# Patient Record
Sex: Male | Born: 1940 | Race: White | Hispanic: No | Marital: Married | Smoking: Former smoker
Health system: Southern US, Community
[De-identification: ages and names within clinical notes are randomized; demographics above are authoritative.]

## PROBLEM LIST (undated history)

## (undated) DIAGNOSIS — H579 Unspecified disorder of eye and adnexa: Secondary | ICD-10-CM

## (undated) DIAGNOSIS — H9190 Unspecified hearing loss, unspecified ear: Secondary | ICD-10-CM

## (undated) DIAGNOSIS — M1990 Unspecified osteoarthritis, unspecified site: Secondary | ICD-10-CM

## (undated) DIAGNOSIS — N289 Disorder of kidney and ureter, unspecified: Secondary | ICD-10-CM

## (undated) DIAGNOSIS — I639 Cerebral infarction, unspecified: Secondary | ICD-10-CM

## (undated) DIAGNOSIS — I519 Heart disease, unspecified: Secondary | ICD-10-CM

## (undated) DIAGNOSIS — J189 Pneumonia, unspecified organism: Secondary | ICD-10-CM

## (undated) DIAGNOSIS — Z1211 Encounter for screening for malignant neoplasm of colon: Secondary | ICD-10-CM

## (undated) DIAGNOSIS — K5792 Diverticulitis of intestine, part unspecified, without perforation or abscess without bleeding: Secondary | ICD-10-CM

## (undated) DIAGNOSIS — I714 Abdominal aortic aneurysm, without rupture, unspecified: Secondary | ICD-10-CM

## (undated) DIAGNOSIS — Z87891 Personal history of nicotine dependence: Secondary | ICD-10-CM

## (undated) DIAGNOSIS — E785 Hyperlipidemia, unspecified: Secondary | ICD-10-CM

## (undated) DIAGNOSIS — M109 Gout, unspecified: Secondary | ICD-10-CM

## (undated) DIAGNOSIS — Z860100 Personal history of colon polyps, unspecified: Secondary | ICD-10-CM

## (undated) DIAGNOSIS — K219 Gastro-esophageal reflux disease without esophagitis: Secondary | ICD-10-CM

## (undated) DIAGNOSIS — IMO0002 Reserved for concepts with insufficient information to code with codable children: Secondary | ICD-10-CM

## (undated) DIAGNOSIS — E669 Obesity, unspecified: Secondary | ICD-10-CM

## (undated) DIAGNOSIS — E119 Type 2 diabetes mellitus without complications: Secondary | ICD-10-CM

## (undated) DIAGNOSIS — I10 Essential (primary) hypertension: Secondary | ICD-10-CM

## (undated) DIAGNOSIS — I959 Hypotension, unspecified: Secondary | ICD-10-CM

## (undated) DIAGNOSIS — K639 Disease of intestine, unspecified: Secondary | ICD-10-CM

## (undated) HISTORY — PX: CORONARY ARTERY BYPASS GRAFT: SHX141

## (undated) HISTORY — DX: Gastro-esophageal reflux disease without esophagitis: K21.9

## (undated) HISTORY — DX: Abdominal aortic aneurysm, without rupture, unspecified: I71.40

## (undated) HISTORY — DX: Diverticulitis of intestine, part unspecified, without perforation or abscess without bleeding: K57.92

## (undated) HISTORY — DX: Unspecified osteoarthritis, unspecified site: M19.90

## (undated) HISTORY — DX: Disease of intestine, unspecified: K63.9

## (undated) HISTORY — DX: Abdominal aortic aneurysm, without rupture: I71.4

## (undated) HISTORY — DX: Disorder of kidney and ureter, unspecified: N28.9

## (undated) HISTORY — DX: Obesity, unspecified: E66.9

## (undated) HISTORY — DX: Personal history of nicotine dependence: Z87.891

## (undated) HISTORY — DX: Encounter for screening for malignant neoplasm of colon: Z12.11

## (undated) HISTORY — DX: Unspecified disorder of eye and adnexa: H57.9

## (undated) HISTORY — DX: Gout, unspecified: M10.9

## (undated) HISTORY — DX: Reserved for concepts with insufficient information to code with codable children: IMO0002

## (undated) HISTORY — DX: Essential (primary) hypertension: I10

## (undated) HISTORY — DX: Type 2 diabetes mellitus without complications: E11.9

## (undated) HISTORY — DX: Cerebral infarction, unspecified: I63.9

## (undated) HISTORY — DX: Hyperlipidemia, unspecified: E78.5

## (undated) HISTORY — PX: STOMACH SURGERY: SHX791

## (undated) HISTORY — DX: Unspecified hearing loss, unspecified ear: H91.90

## (undated) HISTORY — DX: Personal history of colonic polyps: Z86.010

## (undated) HISTORY — DX: Heart disease, unspecified: I51.9

## (undated) HISTORY — DX: Personal history of colon polyps, unspecified: Z86.0100

## (undated) SURGERY — Surgical Case
Anesthesia: *Unknown

---

## 1988-05-05 DIAGNOSIS — I519 Heart disease, unspecified: Secondary | ICD-10-CM

## 1988-05-05 HISTORY — DX: Heart disease, unspecified: I51.9

## 1988-09-02 HISTORY — PX: CORONARY ARTERY BYPASS GRAFT: SHX141

## 1997-05-05 DIAGNOSIS — K639 Disease of intestine, unspecified: Secondary | ICD-10-CM

## 1997-05-05 HISTORY — DX: Disease of intestine, unspecified: K63.9

## 2005-01-29 ENCOUNTER — Ambulatory Visit: Payer: Self-pay | Admitting: Ophthalmology

## 2005-03-19 ENCOUNTER — Ambulatory Visit: Payer: Self-pay | Admitting: Ophthalmology

## 2007-05-06 DIAGNOSIS — M109 Gout, unspecified: Secondary | ICD-10-CM

## 2007-05-06 DIAGNOSIS — IMO0002 Reserved for concepts with insufficient information to code with codable children: Secondary | ICD-10-CM

## 2007-05-06 DIAGNOSIS — I10 Essential (primary) hypertension: Secondary | ICD-10-CM

## 2007-05-06 DIAGNOSIS — E119 Type 2 diabetes mellitus without complications: Secondary | ICD-10-CM

## 2007-05-06 DIAGNOSIS — E785 Hyperlipidemia, unspecified: Secondary | ICD-10-CM

## 2007-05-06 DIAGNOSIS — I639 Cerebral infarction, unspecified: Secondary | ICD-10-CM

## 2007-05-06 DIAGNOSIS — K219 Gastro-esophageal reflux disease without esophagitis: Secondary | ICD-10-CM

## 2007-05-06 HISTORY — PX: COLONOSCOPY: SHX174

## 2007-05-06 HISTORY — DX: Reserved for concepts with insufficient information to code with codable children: IMO0002

## 2007-05-06 HISTORY — DX: Essential (primary) hypertension: I10

## 2007-05-06 HISTORY — DX: Gout, unspecified: M10.9

## 2007-05-06 HISTORY — DX: Gastro-esophageal reflux disease without esophagitis: K21.9

## 2007-05-06 HISTORY — DX: Cerebral infarction, unspecified: I63.9

## 2007-05-06 HISTORY — PX: EYE SURGERY: SHX253

## 2007-05-06 HISTORY — DX: Type 2 diabetes mellitus without complications: E11.9

## 2007-05-06 HISTORY — DX: Hyperlipidemia, unspecified: E78.5

## 2008-02-01 ENCOUNTER — Ambulatory Visit: Payer: Self-pay | Admitting: General Surgery

## 2008-02-10 ENCOUNTER — Ambulatory Visit: Payer: Self-pay | Admitting: General Surgery

## 2008-02-25 ENCOUNTER — Ambulatory Visit: Payer: Self-pay | Admitting: Internal Medicine

## 2008-05-05 DIAGNOSIS — H579 Unspecified disorder of eye and adnexa: Secondary | ICD-10-CM

## 2008-05-05 HISTORY — PX: HERNIA REPAIR: SHX51

## 2008-05-05 HISTORY — DX: Unspecified disorder of eye and adnexa: H57.9

## 2008-08-03 ENCOUNTER — Ambulatory Visit: Payer: Self-pay | Admitting: General Surgery

## 2008-08-21 ENCOUNTER — Ambulatory Visit: Payer: Self-pay | Admitting: General Surgery

## 2008-08-24 ENCOUNTER — Ambulatory Visit: Payer: Self-pay | Admitting: General Surgery

## 2009-02-05 ENCOUNTER — Ambulatory Visit: Payer: Self-pay | Admitting: General Surgery

## 2009-02-16 ENCOUNTER — Ambulatory Visit: Payer: Self-pay | Admitting: Internal Medicine

## 2010-02-11 ENCOUNTER — Ambulatory Visit: Payer: Self-pay | Admitting: General Surgery

## 2010-03-04 ENCOUNTER — Inpatient Hospital Stay: Payer: Self-pay | Admitting: Internal Medicine

## 2011-02-06 ENCOUNTER — Other Ambulatory Visit: Payer: Self-pay | Admitting: General Surgery

## 2011-02-11 ENCOUNTER — Ambulatory Visit: Payer: Self-pay | Admitting: General Surgery

## 2011-03-07 ENCOUNTER — Ambulatory Visit: Payer: Self-pay | Admitting: General Surgery

## 2011-03-07 HISTORY — PX: COLONOSCOPY: SHX174

## 2012-02-12 ENCOUNTER — Ambulatory Visit: Payer: Self-pay | Admitting: General Surgery

## 2012-10-05 ENCOUNTER — Encounter: Payer: Self-pay | Admitting: *Deleted

## 2012-10-27 ENCOUNTER — Ambulatory Visit: Payer: Self-pay | Admitting: Internal Medicine

## 2013-02-14 ENCOUNTER — Ambulatory Visit: Payer: Self-pay | Admitting: General Surgery

## 2013-02-14 ENCOUNTER — Encounter: Payer: Self-pay | Admitting: General Surgery

## 2013-02-24 ENCOUNTER — Ambulatory Visit (INDEPENDENT_AMBULATORY_CARE_PROVIDER_SITE_OTHER): Payer: Medicare Other | Admitting: General Surgery

## 2013-02-24 ENCOUNTER — Encounter: Payer: Self-pay | Admitting: General Surgery

## 2013-02-24 VITALS — BP 130/74 | HR 78 | Resp 14 | Ht 69.0 in | Wt 216.0 lb

## 2013-02-24 DIAGNOSIS — Z86010 Personal history of colonic polyps: Secondary | ICD-10-CM

## 2013-02-24 DIAGNOSIS — I714 Abdominal aortic aneurysm, without rupture: Secondary | ICD-10-CM

## 2013-02-24 NOTE — Patient Instructions (Addendum)
The patient is aware to call back for any questions or concerns. One year follow up with ultrasound of abdominal aortic aneurysm.  Colonoscopy due 2017.

## 2013-02-24 NOTE — Progress Notes (Signed)
Patient ID: Robert Conrad, male   DOB: [birth year], 72 y.o.   MRN: [place]:[id number]  Chief Complaint  Patient presents with  . Follow-up    abdominal aortic aneurysm    HPI Robert Conrad is a 72 y.o. male.  Here today for his one year follow up abdominal aortic aneurysm. He had a ultrasound scan done at [facility]. Also, he has had a prior sigmoid resection for polyps. Colonoscopy 03-07-11 was normal. Currently with no gastrointestinal symptoms. Denies any leg pain with walking. He has been noted to have renal insufficiency being followed by nephrology. HPI  Past Medical History  Diagnosis Date  . 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 Heart disease 1990  . Hypertension 2009  . Personal history of tobacco use, presenting hazards to health   . Ulcer 2009    stomach  . Arthritis   . Gout 2009  . GERD (gastroesophageal reflux disease) 2009  . Stroke 2009  . Eye problems 2010  . Special screening for malignant neoplasms, colon   . 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 Obesity, unspecified   . Hyperlipidemia 2009  . Bowel trouble 1999  . Personal history of colonic polyps   . Diverticulitis   . Hearing loss   . Renal insufficiency     Past Surgical History  Procedure Laterality Date  . Colonoscopy  2009  . Hernia repair  2010    ventral and umbilical   . Stomach surgery    . Eye surgery  2009  . Coronary artery bypass graft  May 1990  . Colonoscopy  03-07-11    Dr Jamal Collin    Family History  Problem Relation Age of Onset  . Cancer Mother   . Heart attack Father     Social History History  Substance Use Topics  . Smoking status: Never Smoker   . Smokeless tobacco: Never Used     Comment: quit in 1990  . Alcohol Use: No    Allergies  Allergen Reactions  . Ciprofloxacin Hcl Rash    Current Outpatient Prescriptions  Medication Sig Dispense Refill  . aspirin 325 MG tablet Take 325 mg by mouth daily.      . benazepril  (LOTENSIN) 20 MG tablet Take 20 mg by mouth daily.       Marland Kitchen glimepiride (AMARYL) 4 MG tablet Take 4 mg by mouth daily before breakfast.       . hydroxypropyl methylcellulose (ISOPTO TEARS) 2.5 % ophthalmic solution 1 drop.      . metFORMIN (GLUCOPHAGE-XR) 500 MG 24 hr tablet Take 1,000 mg by mouth daily with breakfast.       . metoprolol succinate (TOPROL-XL) 25 MG 24 hr tablet Take 25 mg by mouth daily.       . naproxen (NAPROSYN) 500 MG tablet Take 500 mg by mouth daily.       Marland Kitchen omeprazole (PRILOSEC) 20 MG capsule Take 20 mg by mouth daily.       . ONE TOUCH ULTRA TEST test strip       . ONETOUCH DELICA LANCETS 99991111 MISC       . simvastatin (ZOCOR) 20 MG tablet Take 20 mg by mouth daily.        No current facility-administered medications for this visit.    Review of Systems Review of Systems  Constitutional: Negative.   Respiratory: Positive for shortness of breath (chronic).   Cardiovascular: Negative.  Blood pressure 130/74, pulse 78, resp. rate 14, height 5\' 9"  (1.753 m), weight 216 lb (97.977 kg).  Physical Exam Physical Exam  Constitutional: He is oriented to person, place, and time. He appears well-developed and well-nourished.  Eyes: Conjunctivae are normal. No scleral icterus.  Neck: Neck supple. Carotid bruit is not present.  Cardiovascular: Normal rate, regular rhythm and normal heart sounds.   Pulses:      Femoral pulses are 2+ on the right side, and 2+ on the left side.      Dorsalis pedis pulses are 2+ on the right side, and 2+ on the left side.       Posterior tibial pulses are 2+ on the right side, and 2+ on the left side.  Scant edema bilateral lower extremity 1+  Pulmonary/Chest: Effort normal and breath sounds normal.  Abdominal: Soft. Normal appearance and bowel sounds are normal. There is no hepatosplenomegaly. There is no tenderness. No hernia.    Lymphadenopathy:    He has no cervical adenopathy.  Neurological: He is alert and oriented to person,  place, and time.  Skin: Skin is warm and dry.    Data Reviewed Abdominal ultrasound reviewed and stable. AAA measures 4.0/4.3cm. Minimal change over last 2 yrs.  Assessment    Stable exam, abdominal aortic aneurysm stable. History of colon polyp.    Plan    One year follow up with ultrasound of abdominal aortic aneurysm. Colonoscopy due 2017.       Donnia Poplaski G 02/24/2013, 11:41 AM

## 2013-03-18 ENCOUNTER — Emergency Department: Payer: Self-pay | Admitting: Emergency Medicine

## 2013-03-18 LAB — URINALYSIS, COMPLETE
Blood: NEGATIVE
Glucose,UR: 50 mg/dL (ref 0–75)
Leukocyte Esterase: NEGATIVE
Nitrite: NEGATIVE
Ph: 6 (ref 4.5–8.0)
Protein: NEGATIVE
Squamous Epithelial: NONE SEEN

## 2013-03-18 LAB — CBC
HGB: 15.4 g/dL (ref 13.0–18.0)
MCH: 28.5 pg (ref 26.0–34.0)
MCHC: 33.1 g/dL (ref 32.0–36.0)
MCV: 86 fL (ref 80–100)
RDW: 14 % (ref 11.5–14.5)

## 2013-03-18 LAB — COMPREHENSIVE METABOLIC PANEL
Albumin: 3.9 g/dL (ref 3.4–5.0)
Anion Gap: 5 — ABNORMAL LOW (ref 7–16)
BUN: 19 mg/dL — ABNORMAL HIGH (ref 7–18)
EGFR (African American): 54 — ABNORMAL LOW
Osmolality: 283 (ref 275–301)
Potassium: 4.3 mmol/L (ref 3.5–5.1)
SGOT(AST): 14 U/L — ABNORMAL LOW (ref 15–37)
SGPT (ALT): 26 U/L (ref 12–78)
Sodium: 139 mmol/L (ref 136–145)
Total Protein: 7 g/dL (ref 6.4–8.2)

## 2013-08-31 ENCOUNTER — Observation Stay: Payer: Self-pay | Admitting: Internal Medicine

## 2013-08-31 LAB — CK TOTAL AND CKMB (NOT AT ARMC)
CK, TOTAL: 57 U/L
CK, Total: 62 U/L
CK, Total: 60 U/L
CK-MB: 1.1 ng/mL (ref 0.5–3.6)
CK-MB: 1.2 ng/mL (ref 0.5–3.6)
CK-MB: 1.4 ng/mL (ref 0.5–3.6)

## 2013-08-31 LAB — TROPONIN I
Troponin-I: <0.02 ng/mL
Troponin-I: <0.02 ng/mL
Troponin-I: <0.02 ng/mL

## 2013-08-31 LAB — CBC
HCT: 46.9 % (ref 40.0–52.0)
HGB: 15.4 g/dL (ref 13.0–18.0)
MCH: 28.6 pg (ref 26.0–34.0)
MCHC: 32.8 g/dL (ref 32.0–36.0)
MCV: 87 fL (ref 80–100)
PLATELETS: 182 10*3/uL (ref 150–440)
RBC: 5.39 10*6/uL (ref 4.40–5.90)
RDW: 14.5 % (ref 11.5–14.5)
WBC: 8.3 10*3/uL (ref 3.8–10.6)

## 2013-08-31 LAB — COMPREHENSIVE METABOLIC PANEL
ALK PHOS: 46 U/L
Albumin: 3.8 g/dL (ref 3.4–5.0)
Anion Gap: 6 — ABNORMAL LOW (ref 7–16)
BILIRUBIN TOTAL: 0.5 mg/dL (ref 0.2–1.0)
BUN: 24 mg/dL — ABNORMAL HIGH (ref 7–18)
CO2: 29 mmol/L (ref 21–32)
Calcium, Total: 9.1 mg/dL (ref 8.5–10.1)
Chloride: 101 mmol/L (ref 98–107)
Creatinine: 1.42 mg/dL — ABNORMAL HIGH (ref 0.60–1.30)
EGFR (African American): 56 — ABNORMAL LOW
GFR CALC NON AF AMER: 48 — AB
GLUCOSE: 237 mg/dL — AB (ref 65–99)
Osmolality: 284 (ref 275–301)
Potassium: 4.9 mmol/L (ref 3.5–5.1)
SGOT(AST): 15 U/L (ref 15–37)
SGPT (ALT): 25 U/L (ref 12–78)
Sodium: 136 mmol/L (ref 136–145)
Total Protein: 7.2 g/dL (ref 6.4–8.2)

## 2013-09-01 LAB — BASIC METABOLIC PANEL
Anion Gap: 6 — ABNORMAL LOW (ref 7–16)
BUN: 25 mg/dL — ABNORMAL HIGH (ref 7–18)
Calcium, Total: 9.1 mg/dL (ref 8.5–10.1)
Chloride: 103 mmol/L (ref 98–107)
Co2: 28 mmol/L (ref 21–32)
Creatinine: 1.32 mg/dL — ABNORMAL HIGH (ref 0.60–1.30)
EGFR (African American): >60
EGFR (Non-African Amer.): 53 — ABNORMAL LOW
Glucose: 148 mg/dL — ABNORMAL HIGH (ref 65–99)
OSMOLALITY: 281 (ref 275–301)
Potassium: 4.5 mmol/L (ref 3.5–5.1)
Sodium: 137 mmol/L (ref 136–145)

## 2013-09-02 LAB — CK TOTAL AND CKMB (NOT AT ARMC)
CK, Total: 46 U/L
CK-MB: 1.1 ng/mL (ref 0.5–3.6)

## 2013-09-02 LAB — BASIC METABOLIC PANEL
ANION GAP: 7 (ref 7–16)
BUN: 19 mg/dL — ABNORMAL HIGH (ref 7–18)
Calcium, Total: 9.1 mg/dL (ref 8.5–10.1)
Chloride: 104 mmol/L (ref 98–107)
Co2: 26 mmol/L (ref 21–32)
Creatinine: 1.19 mg/dL (ref 0.60–1.30)
GFR CALC NON AF AMER: 60 — AB
GLUCOSE: 150 mg/dL — AB (ref 65–99)
OSMOLALITY: 279 (ref 275–301)
POTASSIUM: 4.7 mmol/L (ref 3.5–5.1)
SODIUM: 137 mmol/L (ref 136–145)

## 2013-09-27 DIAGNOSIS — I25810 Atherosclerosis of coronary artery bypass graft(s) without angina pectoris: Secondary | ICD-10-CM | POA: Insufficient documentation

## 2013-09-29 ENCOUNTER — Telehealth: Payer: Self-pay | Admitting: General Surgery

## 2013-09-29 NOTE — Telephone Encounter (Signed)
PAM CALLED FROM DR [facility] STATING Robert Conrad NEEDS AN APPT TO DR Candise Bowens TO EVAL TRIPLE AAA WITH RT RENAL ARTERIAL STENOSIS FOUND ON CATH DONE 2015/MTH    ([facility] # [phone])

## 2013-09-30 HISTORY — PX: CARDIAC CATHETERIZATION: SHX172

## 2013-10-05 ENCOUNTER — Encounter: Payer: Self-pay | Admitting: General Surgery

## 2013-10-05 ENCOUNTER — Ambulatory Visit: Payer: Medicare HMO | Admitting: General Surgery

## 2013-10-05 ENCOUNTER — Ambulatory Visit (INDEPENDENT_AMBULATORY_CARE_PROVIDER_SITE_OTHER): Payer: Medicare HMO | Admitting: General Surgery

## 2013-10-05 VITALS — BP 140/82 | HR 80 | Resp 12 | Ht 69.0 in | Wt 212.0 lb

## 2013-10-05 DIAGNOSIS — I714 Abdominal aortic aneurysm, without rupture, unspecified: Secondary | ICD-10-CM

## 2013-10-05 DIAGNOSIS — I701 Atherosclerosis of renal artery: Secondary | ICD-10-CM | POA: Insufficient documentation

## 2013-10-05 NOTE — Progress Notes (Signed)
Patient ID: Robert Conrad, male   DOB: 12/12/1940, 73 y.o.   MRN: [place]:[id number]  Chief Complaint  Patient presents with  . Other    AAA and right renal artery stenosis    HPI Robert Conrad is a 73 y.o. male.  Here today for evaluation of a abdominal aortic aneurysm and right renal artery stenosis. Referred by Dr. Ubaldo Glassing, cath done 09-01-13. It is reported he has a 95% stenosis of right renal artery.m No mention of AAAsize. Last US showed a stable 4.2 cm AAA-this ws 60mos ago HPI  Past Medical History  Diagnosis Date  . 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 Heart disease 1990  . Hypertension 2009  . Personal history of tobacco use, presenting hazards to health   . Ulcer 2009    stomach  . Arthritis   . Gout 2009  . GERD (gastroesophageal reflux disease) 2009  . Stroke 2009  . Eye problems 2010  . Special screening for malignant neoplasms, colon   . 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 Obesity, unspecified   . Hyperlipidemia 2009  . Bowel trouble 1999  . Personal history of colonic polyps   . Diverticulitis   . Hearing loss   . Renal insufficiency     Past Surgical History  Procedure Laterality Date  . Colonoscopy  2009  . Hernia repair  2010    ventral and umbilical   . Stomach surgery      sigmoid colon resection  . Eye surgery  2009  . Coronary artery bypass graft  May 1990  . Colonoscopy  03-07-11    Dr Jamal Collin  . Cardiac catheterization  09-30-13    Family History  Problem Relation Age of Onset  . Cancer Mother   . Heart attack Father     Social History History  Substance Use Topics  . Smoking status: Never Smoker   . Smokeless tobacco: Never Used     Comment: quit in 1990  . Alcohol Use: No    Allergies  Allergen Reactions  . Ciprofloxacin Hcl Rash    Current Outpatient Prescriptions  Medication Sig Dispense Refill  . aspirin 325 MG tablet Take 325 mg by mouth daily.      . benazepril (LOTENSIN) 20  MG tablet Take 20 mg by mouth daily.       Marland Kitchen BRILINTA 90 MG TABS tablet Take 1 tablet by mouth daily.      . chlorthalidone (HYGROTON) 25 MG tablet Take 1 tablet by mouth daily.      Marland Kitchen glimepiride (AMARYL) 4 MG tablet Take 4 mg by mouth daily before breakfast.       . hydroxypropyl methylcellulose (ISOPTO TEARS) 2.5 % ophthalmic solution 1 drop.      . metFORMIN (GLUCOPHAGE-XR) 500 MG 24 hr tablet Take 1,000 mg by mouth daily with breakfast.       . metoprolol succinate (TOPROL-XL) 25 MG 24 hr tablet Take 25 mg by mouth daily.       . naproxen (NAPROSYN) 500 MG tablet Take 500 mg by mouth daily.       Marland Kitchen omeprazole (PRILOSEC) 20 MG capsule Take 20 mg by mouth daily.       . ONE TOUCH ULTRA TEST test strip       . ONETOUCH DELICA LANCETS 99991111 MISC       . simvastatin (ZOCOR) 20 MG tablet Take 20 mg by mouth daily.  No current facility-administered medications for this visit.    Review of Systems Review of Systems  Constitutional: Negative.   Respiratory: Positive for chest tightness and shortness of breath. Negative for apnea, cough, choking, wheezing and stridor.   Cardiovascular: Negative.     Blood pressure 140/82, pulse 80, resp. rate 12, height 5\' 9"  (1.753 m), weight 212 lb (96.163 kg).  Physical Exam Physical Exam  Constitutional: He is oriented to person, place, and time. He appears well-developed and well-nourished.  Eyes: Conjunctivae are normal. No scleral icterus.  Neck: Neck supple.  Cardiovascular: Normal rate, regular rhythm, normal heart sounds, intact distal pulses and normal pulses.   Pulses:      Carotid pulses are 2+ on the right side, and 2+ on the left side.      Dorsalis pedis pulses are 2+ on the right side, and 2+ on the left side.       Posterior tibial pulses are 2+ on the right side, and 2+ on the left side.  No edema   Pulmonary/Chest: Effort normal and breath sounds normal.  Abdominal: Soft. Normal appearance and bowel sounds are normal. There is  no hepatomegaly. There is no tenderness.  Neurological: He is alert and oriented to person, place, and time.  Skin: Skin is warm and dry.    Data Reviewed Notes reviewed from cath report.   Assessment    AAA, right renal artery stenosis. Discussed possibility of renal artery stent.He is agreeable to a consult    Plan    Appt with Dr. Delana Meyer from Vascular surgery     Patient is scheduled to see Dr Delana Meyer on 10/20/13 at 2:15 pm. Patient is aware of date and time.  Seeplaputhur G Sankar 10/05/2013, 1:03 PM

## 2013-10-05 NOTE — Patient Instructions (Addendum)
Follow up appointment to be announced.    Patient is scheduled to see Dr Delana Meyer on 10/20/13 at 2:15 pm. Patient is aware of date and time.

## 2013-10-19 ENCOUNTER — Ambulatory Visit: Payer: Medicare HMO | Admitting: General Surgery

## 2013-11-22 ENCOUNTER — Ambulatory Visit: Payer: Self-pay | Admitting: Vascular Surgery

## 2013-11-22 LAB — BASIC METABOLIC PANEL
Anion Gap: 7 (ref 7–16)
BUN: 22 mg/dL — ABNORMAL HIGH (ref 7–18)
CO2: 28 mmol/L (ref 21–32)
Calcium, Total: 8.7 mg/dL (ref 8.5–10.1)
Chloride: 104 mmol/L (ref 98–107)
Creatinine: 1.62 mg/dL — ABNORMAL HIGH (ref 0.60–1.30)
EGFR (African American): 48 — ABNORMAL LOW
GFR CALC NON AF AMER: 41 — AB
GLUCOSE: 131 mg/dL — AB (ref 65–99)
Osmolality: 283 (ref 275–301)
Potassium: 4.7 mmol/L (ref 3.5–5.1)
SODIUM: 139 mmol/L (ref 136–145)

## 2013-12-20 ENCOUNTER — Ambulatory Visit: Payer: Self-pay | Admitting: Cardiology

## 2014-02-28 ENCOUNTER — Ambulatory Visit: Payer: Medicare HMO | Admitting: General Surgery

## 2014-03-02 ENCOUNTER — Encounter: Payer: Self-pay | Admitting: *Deleted

## 2014-08-26 NOTE — Discharge Summary (Signed)
PATIENT NAME:  Robert Conrad, CONTINO MR#:  [id number] DATE OF BIRTH:  1941/01/26  DATE OF ADMISSION:  08/31/2013 DATE OF DISCHARGE:  09/02/2013  ADMITTING DIAGNOSIS: Chest pain.   DISCHARGE DIAGNOSES: 1.  Chest pain due to unstable angina due to occlusion of the proximal circumflex, status post stent.  2.  Coronary artery disease, status post coronary artery bypass grafting in 1990.  3.  History of cerebrovascular accident.  4.  Gastroesophageal reflux disease.  5.  Gout.  6.  Osteoarthritis.  7.  Hyperlipidemia.  8.  Diabetes.  9.  Hypertension.  10.  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  11.  Renal artery stenosis.  12.  Abdominal aortic aneurysm. 13.  Cardiac catheterization showed same occluded saphenous vein graft to right coronary artery, circumflex 90%, diffuse right coronary artery disease.   CONSULTANTS: Dr. Clayborn Bigness.   HOSPITAL COURSE: Please refer to H and P done by the admitting physician. The patient is a 74 year old white male with history of coronary artery disease, CABG, presented with chest pain who was evaluated in the ED and had a negative EKG and cardiac enzymes. Due to these symptoms, he was seen by his cardiologist and plan was to perform a cath. He underwent a cardiac cath and underwent a stenting to the circumflex. The patient is currently asymptomatic and is doing well and is stable for discharge.   DISCHARGE MEDICATIONS: Benazepril 20 daily, omeprazole 20 daily, simvastatin 20 daily, metformin 500 mg 1 tab p.o. b.i.d., glimepiride 4 mg daily, naproxen 220 mg 2 tabs at bedtime,  Eye Health Formula 1 cap daily, chlorthalidone 25 daily, metoprolol succinate 25 mg 1 tab p.o. b.i.d., aspirin 81 mg 1 tab p.o. daily, Brilinta 90 mg 1 tab p.o. b.i.d.   DIET: Low-sodium, low-fat, low-cholesterol.   ACTIVITY: As tolerated.   FOLLOWUP: With primary MD in 1 to 2 weeks. Follow up with Dr. Clayborn Bigness in 2 to 4 weeks.   TIME SPENT: 35 minutes.    ____________________________ Lafonda Mosses Posey Pronto, MD shp:jcm D: 09/02/2013 14:07:33 ET T: 09/02/2013 15:57:25 ET JOB#: [id number]  cc: Alaya Iverson H. Posey Pronto, MD, <Dictator> Alric Seton MD ELECTRONICALLY SIGNED 09/07/2013 16:15

## 2014-08-26 NOTE — Consult Note (Signed)
Chief Complaint:  Subjective/Chief Complaint Pt state sto be doing well. He is s/p PCI stent to circ. He states to have polyuria from fluids. No cp.   VITAL SIGNS/ANCILLARY NOTES: **Vital Signs.:   30-Apr-15 09:40  Temperature Temperature (F) 98.2  Temperature Source oral  Pulse Pulse 79  Respirations Respirations 18  Systolic BP Systolic BP 151  Diastolic BP (mmHg) Diastolic BP (mmHg) 76  Pulse Ox % Pulse Ox % 96    76:16  Systolic BP Systolic BP 073  *Intake and Output.:   Daily 30-Apr-15 07:00  Grand Totals Intake:  240 Output:  500    Net:  -260 24 Hr.:  -260  Oral Intake      In:  240  Urine ml     Out:  500  Length of Stay Totals Intake:  240 Output:  500    Net:  -260   Brief Assessment:  GEN well developed, well nourished, no acute distress   Cardiac Regular  murmur present   Respiratory normal resp effort   Gastrointestinal Normal   Gastrointestinal details normal Soft  Nontender  Nondistended  Bowel sounds normal   Lab Results: Hepatic:  29-Apr-15 07:43   Bilirubin, Total 0.5  Alkaline Phosphatase 46 (45-117 NOTE: New Reference Range 03/25/13)  SGPT (ALT) 25  SGOT (AST) 15  Total Protein, Serum 7.2  Albumin, Serum 3.8  Cardiology:  30-Apr-15 12:34   Ventricular Rate 70  Atrial Rate 70  P-R Interval 248  QRS Duration 148  QT 416  QTc 449  P Axis 56  R Axis 121  T Axis 63  ECG interpretation Sinus rhythm with 1st degree A-V block Right bundle branch block Abnormal ECG When compared with ECG of 31-Aug-2013 07:21, Minimal criteria for Inferior infarct are no longer Present ----------unconfirmed---------- Confirmed by OVERREAD, NOT ([id number]), editor PEARSON, BARBARA ([id number]) on 09/01/2013 1:50:53 PM  Routine Chem:  29-Apr-15 07:43   Glucose, Serum  237  BUN  24  Creatinine (comp)  1.42  Sodium, Serum 136  Potassium, Serum 4.9  Chloride, Serum 101  CO2, Serum 29  Calcium (Total), Serum 9.1  Anion Gap  6  Osmolality (calc) 284  eGFR (African  American)  56  eGFR (Non-African American)  48 (eGFR values <78m/min/1.73 m2 may be an indication of chronic kidney disease (CKD). Calculated eGFR is useful in patients with stable renal function. The eGFR calculation will not be reliable in acutely ill patients when serum creatinine is changing rapidly. It is not useful in  patients on dialysis. The eGFR calculation may not be applicable to patients at the low and high extremes of body sizes, pregnant women, and vegetarians.)  30-Apr-15 04:08   Glucose, Serum  148  BUN  25  Creatinine (comp)  1.32  Sodium, Serum 137  Potassium, Serum 4.5  Chloride, Serum 103  CO2, Serum 28  Calcium (Total), Serum 9.1  Anion Gap  6  Osmolality (calc) 281  eGFR (African American) >60  eGFR (Non-African American)  53 (eGFR values <683mmin/1.73 m2 may be an indication of chronic kidney disease (CKD). Calculated eGFR is useful in patients with stable renal function. The eGFR calculation will not be reliable in acutely ill patients when serum creatinine is changing rapidly. It is not useful in  patients on dialysis. The eGFR calculation may not be applicable to patients at the low and high extremes of body sizes, pregnant women, and vegetarians.)  Result Comment POTASSIUM - Slight hemolysis, interpret results with  -  caution.Marland Kitchentpl  Result(s) reported on 01 Sep 2013 at 05:18AM.  01-May-15 04:10   Glucose, Serum  150  BUN  19  Creatinine (comp) 1.19  Sodium, Serum 137  Potassium, Serum 4.7  Chloride, Serum 104  CO2, Serum 26  Calcium (Total), Serum 9.1  Anion Gap 7  Osmolality (calc) 279  eGFR (African American) >60  eGFR (Non-African American)  60 (eGFR values <27m/min/1.73 m2 may be an indication of chronic kidney disease (CKD). Calculated eGFR is useful in patients with stable renal function. The eGFR calculation will not be reliable in acutely ill patients when serum creatinine is changing rapidly. It is not useful in  patients on  dialysis. The eGFR calculation may not be applicable to patients at the low and high extremes of body sizes, pregnant women, and vegetarians.)  Cardiac:  29-Apr-15 07:43   CK, Total 62 (39-308 NOTE: NEW REFERENCE RANGE  06/06/2013)  CPK-MB, Serum 1.1 (Result(s) reported on 31 Aug 2013 at 08:23AM.)  Troponin I < 0.02 (0.00-0.05 0.05 ng/mL or less: NEGATIVE  Repeat testing in 3-6 hrs  if clinically indicated. >0.05 ng/mL: POTENTIAL  MYOCARDIAL INJURY. Repeat  testing in 3-6 hrs if  clinically indicated. NOTE: An increase or decrease  of 30% or more on serial  testing suggests a  clinically important change)    14:36   CK, Total 57 (39-308 NOTE: NEW REFERENCE RANGE  06/06/2013)  CPK-MB, Serum 1.2 (Result(s) reported on 31 Aug 2013 at 03:07PM.)  Troponin I < 0.02 (0.00-0.05 0.05 ng/mL or less: NEGATIVE  Repeat testing in 3-6 hrs  if clinically indicated. >0.05 ng/mL: POTENTIAL  MYOCARDIAL INJURY. Repeat  testing in 3-6 hrs if  clinically indicated. NOTE: An increase or decrease  of 30% or more on serial  testing suggests a  clinically important change)    17:59   CK, Total 60 (39-308 NOTE: NEW REFERENCE RANGE  06/06/2013)  CPK-MB, Serum 1.4 (Result(s) reported on 31 Aug 2013 at 07:20PM.)  Troponin I < 0.02 (0.00-0.05 0.05 ng/mL or less: NEGATIVE  Repeat testing in 3-6 hrs  if clinically indicated. >0.05 ng/mL: POTENTIAL  MYOCARDIAL INJURY. Repeat  testing in 3-6 hrs if  clinically indicated. NOTE: An increase or decrease  of 30% or more on serial  testing suggests a  clinically important change)  01-May-15 04:10   CK, Total 46 (39-308 NOTE: NEW REFERENCE RANGE  06/06/2013)  CPK-MB, Serum 1.1 (Result(s) reported on 02 Sep 2013 at 06:39AM.)  Routine Hem:  29-Apr-15 07:43   WBC (CBC) 8.3  RBC (CBC) 5.39  Hemoglobin (CBC) 15.4  Hematocrit (CBC) 46.9  Platelet Count (CBC) 182 (Result(s) reported on 31 Aug 2013 at 08:01AM.)  MCV 87  MCH 28.6  MCHC 32.8  RDW  14.5   Radiology Results: XRay:    29-Apr-15 08:48, Chest PA and Lateral  Chest PA and Lateral   REASON FOR EXAM:    chest pain  COMMENTS:       PROCEDURE: DXR - DXR CHEST PA (OR AP) AND LATERAL  - Aug 31 2013  8:48AM     CLINICAL DATA:  Intermittent chest pain for 3 weeks.    EXAM:  CHEST  2 VIEW    COMPARISON:  10/27/2012    FINDINGS:  Sequelae of prior CABG are again identified. Cardiac silhouette is  within normal limits in size for degree of inspiration. The patient  has taken a shallower inspiration than on the prior study. Minimal  linear opacities are present in the lung  bases. No confluent  airspace consolidation, edema, pleural effusion, or pneumothorax is  identified. No acute osseous abnormality is identified.     IMPRESSION:  Minimal bibasilar atelectasis.      Electronically Signed    By: Logan Bores    On: 08/31/2013 08:57         Verified By: Ferol Luz, M.D.,  Cardiology:    29-Apr-15 07:21, ED ECG  Ventricular Rate 76  Atrial Rate 76  P-R Interval 254  QRS Duration 148  QT 404  QTc 454  P Axis 45  R Axis 149  T Axis 50  ECG interpretation   Sinus rhythm with 1st degree A-V block  Right bundle branch block  Left posterior fascicular block  *** Bifascicular block ***  Cannot rule out Inferior infarct (cited on or before 31-Aug-2013)  Abnormal ECG  When compared with ECG of 31-Aug-2013 07:21,  No significant change was found  ----------unconfirmed----------  Confirmed by OVERREAD, NOT ([id number]), editor PEARSON, BARBARA ([id number]) on 09/01/2013 1:54:25 PM  ED ECG     30-Apr-15 12:34, ECG  Ventricular Rate 70  Atrial Rate 70  P-R Interval 248  QRS Duration 148  QT 416  QTc 449  P Axis 56  R Axis 121  T Axis 63  ECG interpretation   Sinus rhythm with 1st degree A-V block  Right bundle branch block  Abnormal ECG  When compared with ECG of 31-Aug-2013 07:21,  Minimal criteria for Inferior infarct are no longer  Present  ----------unconfirmed----------  Confirmed by OVERREAD, NOT ([id number]), editor PEARSON, BARBARA ([id number]) on 09/01/2013 1:50:53 PM  ECG    Assessment/Plan:  Assessment/Plan:  Assessment IMP CAD Angina HTN Hyperlipidemia Hx CABG Abn Ekg S/P PCI stent DES circ CRI  .   Plan PLAN asa 325 mg daily Switch to plavix 75 mg daily x 1 yr Add imdur 30 mg daily Continue b-blocker Statin therapy Increase activity F/U with nephology F/U with Vascular for RAS Hopefully d/c home in am   Electronic Signatures: Lujean Amel D (MD)  (Signed 01-May-15 10:44)  Authored: Chief Complaint, VITAL SIGNS/ANCILLARY NOTES, Brief Assessment, Lab Results, Radiology Results, Assessment/Plan   Last Updated: 01-May-15 10:44 by Yolonda Kida (MD)

## 2014-08-26 NOTE — Consult Note (Signed)
PATIENT NAME:  Robert Conrad, Robert Conrad MR#:  [id number] DATE OF BIRTH:  1940-08-15  DATE OF CONSULTATION:  08/31/2013  REFERRING PHYSICIAN:   CONSULTING PHYSICIAN:  Dwayne D. Clayborn Bigness, MD  PRIMARY CARE PHYSICIAN: Dr. Hall Busing  CARDIOLOGIST: Dr. Ubaldo Glassing  NEPHROLOGIST: Dr. Dub Amis  INDICATION FOR ADMISSION: Unstable angina and chest pain.  HISTORY OF PRESENT ILLNESS: The patient is a 74 year old white male with history of coronary artery disease, myocardial infarction, coronary artery bypass over 15 years ago at [facility], diabetes, hypertension, and renal insufficiency who presented to the Emergency Room with worsening chest pain and anginal symptoms. The patient noticed that about 6 months prior he started having flashes of chest pain, angina, which had gotten worse with activity, sometimes at rest. He had been evaluated by Dr. Ubaldo Glassing and a few months ago was advised to have a cath. At the time, the patient refused. A day before admission, the patient was walking to his truck, walking up a hill and had significant chest discomfort, angina, and shortness of breath, and by the day of admission he told his family he needed to go see the doctor and was brought in for further evaluation. EKG had nonspecific findings and troponin was negative, but said he had had accelerating symptomatology for unstable angina.  PAST MEDICAL HISTORY: Coronary artery disease, cerebrovascular accident, GERD, gout, arthritis, hypertension, diabetes, hypertension, peripheral vascular disease, abdominal aortic aneurysm.   PAST SURGICAL HISTORY: Coronary artery bypass surgery around 1980.  ALLERGIES: CIPRO.  FAMILY HISTORY: Hypertension, coronary artery disease, hyperlipidemia, diabetes.   SOCIAL HISTORY: Smoked, quit several years ago. Lives with his wife. Denies alcohol consumption.   MEDICATIONS: Aspirin 325 a day, benazepril 20 a day, glimepiride 40 once a day, loratadine 10 a day, meclizine 25 three times a day, metformin 500 twice a  day, naproxen once a day, simvastatin 20 a day, Tylenol p.r.n. for arthritis.   REVIEW OF SYSTEMS: No blackout spells or syncope. He has had no nausea, no vomiting. No fever, no chills. No weight loss, no weight gain. No hemoptysis, hematemesis, bright red blood per rectum. He has had some dizziness, chest pain. He has had some dyspnea on exertion. Otherwise negative.   PHYSICAL EXAMINATION: VITAL SIGNS: Blood pressure 160/80, pulse 70, respiratory rate 14, afebrile.  HEENT: Normocephalic, atraumatic. Pupils equal and reactive to light.  NECK: Supple. No significant JVD, bruits or adenopathy.  LUNGS: Clear to auscultation and percussion. No significant wheeze, rhonchi, or rales.  HEART: Regular rate and rhythm. Systolic ejection murmur at the left sternal border. Positive S4. PMI nondisplaced.  ABDOMEN: Benign.  EXTREMITIES: Within normal limits.  NEUROLOGIC: Intact.  SKIN: Normal.   DIAGNOSTIC DATA: Glucose 237, BUN 24, creatinine 1.4. GFR 48. LFTs normal. Troponin less than 0.02. White count 8, hemoglobin 15.4, platelet count 182,000.  EKG: Sinus rhythm, right bundle branch block, left anterior fascicular block. Nonspecific finding. No significant change from previously.   Chest x-ray unremarkable.   ASSESSMENT: 1.  Unstable angina.  2.  Coronary artery disease. 3.  Hypertension. 4.  Diabetes. 5.  Hyperlipidemia. 6.  Renal insufficiency, chronic. 7.  Vertigo. 8.  Peripheral vascular disease. 9.  History of cerebrovascular accident.   PLAN:  1.  Agree with admit. Rule out for myocardial infarction. Follow up cardiac enzymes. Follow-up EKG. Continue aspirin and place on anticoagulation short-term. Continue to evaluate for anginal symptoms. Will probably proceed with a catheterization as suggested by Dr. Ubaldo Glassing for evaluation of his coronary artery bypass arteries as well as native arteries.  Because it has been over 25 years since his bypass, I am concerned about the health of his  bypass graft as well as  progression of his native coronary arteries. Would recommend cardiac catheterization in the morning. 2.  For coronary artery disease, continue aspirin, statin, and beta blockade therapy. Consider low-dose ACE inhibitor if renal function can tolerate it.  3.  For diabetes, continue diabetes management with glimepiride. Follow up fasting sugar and Hemoglobin A1c. 4.  For hypertension, continue blood pressure control with beta blocker, benazepril. He may need additional blood pressure support. His blood pressure is at best borderline controlled.  5.  For vertigo, continue meclizine p.r.n.  6.  For hyperlipidemia, continue simvastatin therapy.   Again, we will proceed with cardiac catheterization initially for further evaluation. We will discuss the case with Dr. Ubaldo Glassing and send records to Dr. Hall Busing.   ____________________________ Loran Senters. Clayborn Bigness, MD ddc:sb D: 09/01/2013 08:17:03 ET T: 09/01/2013 08:51:33 ET JOB#: [id number]  cc: Dwayne D. Clayborn Bigness, MD, <Dictator> Yolonda Kida MD ELECTRONICALLY SIGNED 09/28/2013 16:30

## 2014-08-26 NOTE — Op Note (Signed)
PATIENT NAME:  Robert Conrad MR#:  [id number] DATE OF BIRTH:  [birth year]  DATE OF PROCEDURE:  11/22/2013  PREOPERATIVE DIAGNOSES:  1.  Poorly controlled hypertension.  2.  Renal insufficiency.  3.  Abdominal aortic aneurysm.  4.  Atherosclerotic occlusive disease, bilateral lower extremities.   POSTOPERATIVE DIAGNOSES: 1.  Poorly controlled hypertension.  2.  Renal insufficiency.  3.  Abdominal aortic aneurysm.  4.  Atherosclerotic occlusive disease, bilateral lower extremities.   PROCEDURES PERFORMED:  1.  Abdominal aortogram.  2.  Selective injection of the right renal artery.  3.  Percutaneous transluminal angioplasty with stent placement, right renal artery using a combination of a 6 x 18 Herculink and a 7 x 15 Herculink.   SURGEON: Hortencia Pilar, M.D.   SEDATION: Versed 3 mg plus fentanyl 50 mcg administered IV. Continuous ECG, pulse oximetry, and cardiopulmonary monitoring were performed throughout the entire procedure by the interventional radiology nurse. Total sedation time was 50 minutes.   ACCESS: A 6 French sheath, left common femoral artery.   FLUOROSCOPY TIME: 6.5 minutes.   CONTRAST USED: Isovue 45 mL.   INDICATIONS: [name] is a 74 year old gentleman who was found to have a high-grade renal artery stenosis on cardiac catheterization several months ago. Since that time, his blood pressure has been monitored and he has been under poor control despite multiple medications. He was also noted to have mild renal insufficiency with a creatinine of 1.5. He, therefore, was counseled to undergo angioplasty and stent placement, particularly in light of his abdominal aortic aneurysm, which will require likely require repair within the next year.  The risks and benefits were reviewed. All questions have been answered. The patient has agreed to proceed.   DESCRIPTION OF PROCEDURE: The patient is taken to special procedures and placed in the supine position. After adequate  sedation is achieved, both groins are prepped and draped in a sterile fashion. Ultrasound is placed in a sterile sleeve. Ultrasound is utilized to secondary to lack of appropriate landmarks and to avoid vascular injury. Under real-time visualization, common femoral artery is identified. It is echolucent and pulsatile indicating patency. Significant posterior plaque is identified on ultrasound.   Puncture site is selected and 1% lidocaine is infiltrated, and under real-time visualization with the ultrasound, a microneedle is inserted, Microwire followed by micro sheath, J-wire followed by a 5 French sheath and 5 French pigtail catheter.   Pigtail catheter is positioned at the level of T12 and AP projection of the aorta is obtained. Renal arteries identified and a high-grade stenosis of the right middle artery is confirmed. Four thousand units of heparin is given. A stiff angled Glidewire is introduced through the pigtail catheter and the pigtail catheter and 5 French sheath are removed, and a 6 [place] Ansell sheath is advanced, followed by placement of a VS1 catheter over the wire.  Glidewire and catheter are then used to engage the renal. The wire is advanced, followed by advancing the catheter and then the sheath so that the sheath is protruding into the right renal artery by several millimeters.  Hand injection contrast then used to demonstrate the renal artery through the catheter. This represents first-order catheter placement.   Measurements are then made. Renal artery just past the lesion measures 6.1 mm, and a 6 x 18  Herculink stent is opened and prepped.  It is advanced across the lesion and positioned so that it should be protruding slightly into the aorta. Upon inflation, the stent does shift more forward  by about 3 or 4 mm. It does cover the lesion and treats the lesion adequately, but allows the ostia to be unprotected and, therefore, a 7 x 15 second Herculink stent is advanced over the wire  overlapping the first one and then deployed so that it protrudes approximately 2 to 3 mm into the aorta as is desired. The entire stented segment is then post dilated with a 7 balloon. Final images obtained and the sheath is then pulled back into the external iliac on the right. J-wire is advanced. Oblique view is obtained and a StarClose device deployed without difficulty. There are no immediate complications.   INTERPRETATION: The abdominal aorta is opacified with a bolus injection of contrast. Aneurysmal dilatation  is noted with a heavily calcified rim identified on fluoroscopy as well. There is moderate disease within the iliac system bilaterally. In the distal external iliac on the left, there is a 60% narrowing.   The right renal artery demonstrates a long segment of plaque which has an eccentric prominence in its mid distal portion representing approximately a 60%-70% narrowing. Measurements, as noted above, just past this maximal narrowing are 6.1 mm.  Therefore, a 6 x 18 stent is selected. This is deployed and covers the lesion, but does not fully extend to the ostia and, therefore, a second stent is placed to cover the ostia as well. Follow-up imaging demonstrates 2 very well positioned stents with preservation of distal runoff and excellent flow to the right renal artery.   SUMMARY: Successful angioplasty and stent placement, right renal artery.   ____________________________ Katha Cabal, MD ggs:ts D: 11/22/2013 13:40:30 ET T: 11/22/2013 13:51:51 ET JOB#: [id number]  cc: Katha Cabal, MD, <Dictator> Leona Carry. Hall Busing, MD Mckinley Jewel, MD Javier Docker. Ubaldo Glassing, MD Katha Cabal MD ELECTRONICALLY SIGNED 11/29/2013 12:23

## 2014-08-26 NOTE — H&P (Signed)
PATIENT NAME:  Robert Conrad, MCGEORGE MR#:  [id number] DATE OF BIRTH:  19-May-1940  DATE OF ADMISSION:  08/31/2013  PRIMARY CARE PHYSICIAN: Sharlet Salina C. Hall Busing, MD  NEPHROLOGIST: Dr. Dub Amis, MD   CARDIOLOGIST: Javier Docker. Ubaldo Glassing, MD  CHIEF COMPLAINT: Chest pain.   HISTORY OF PRESENTING ILLNESS: A 74 year old Caucasian male patient with history of CAD, CABG, diabetes, hypertension, presents to the Emergency Room complaining of worsening chest pain. The patient noticed initially 6 months prior that he had flashes of chest pain. This has worsened to the point that he gets this with exertion at this time. Yesterday, he was trying to get his truck and walked uphill and had significant chest pain and shortness of breath. Today, on family convincing to come to ER, he presents here. The patient's cardiac enzymes are normal. EKG does not show any ST elevation, but considering the patient's symptoms, which are consistent with unstable angina, the patient is being admitted to the hospitalist service.   PAST MEDICAL HISTORY:  1. CAD.  2. CABG in 1990.  3. CVA in 1997.  4. GERD.  5. Gout.  6. Arthritis.  7. Hyperlipidemia.  8. Diabetes.  9. Hypertension.  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  11. Abdominal aortic aneurysm.   ALLERGIES: CIPRO.   FAMILY HISTORY: Hypertension, CAD, hyperlipidemia and diabetes.   SOCIAL HISTORY: The patient smoked in the past, quit 25 years back. Does not drink alcohol. Lives with his wife in [place].   HOME MEDICATIONS:  1. Aspirin 325 mg daily. 2. Benazepril 20 mg oral once a day. 3. Glimepiride 4 mg oral once a day. 4. Loratadine 10 mg a day.  5. Meclizine 25 mg 3 times a day as needed for dizziness.  6. Metformin 500 mg oral 2 times a day.  7. Metoprolol succinate extended release 25 mg daily.  8. Naproxen 500 mg oral once a day. 10. Simvastatin 20 mg daily.  11. Tylenol Arthritis 650 mg 2 tablets once a day at bedtime.   REVIEW OF SYSTEMS:  CONSTITUTIONAL: No fever,  fatigue, weakness.  EYES: No blurred vision, pain or redness. ENT: No tinnitus, ear pain, hearing loss.  RESPIRATORY: No cough, wheeze, hemoptysis.  CARDIOVASCULAR: Chest pain.  GASTROINTESTINAL: No nausea, vomiting, diarrhea or abdominal pain.  GENITOURINARY: No dysuria, hematuria or frequency.  ENDOCRINE: No polyuria, nocturia or thyroid problems.  HEMATOLOGIC AND LYMPHATIC: No anemia, easy bruising or bleeding.  INTEGUMENTARY: No tenderness or lesion.  MUSCULOSKELETAL: No back pain or arthritis.  NEUROLOGICAL: No focal numbness, weakness or seizure.  PSYCHIATRIC: No anxiety or depression.   PHYSICAL EXAMINATION:  VITAL SIGNS: Show temperature 97.9, pulse of 73, blood pressure 169/74 and 137/70, saturating 94% on room air.  GENERAL: Obese Caucasian male patient lying in bed, seems comfortable, conversational, cooperative with exam. PSYCHIATRIC: He is alert and oriented x3. Mood and affect appropriate. Judgment intact.  HEENT: Atraumatic, normocephalic. Oral mucosa moist and pink. External ears and nose normal. No pallor. No icterus. Pupils bilaterally equal and reactive to light.  NECK: Supple. No thyromegaly. No palpable lymph nodes. Trachea midline. No carotid bruits or JVD. CARDIOVASCULAR: S1 and S2, without any murmurs. Peripheral pulses 2+. No edema.  RESPIRATORY: Normal work of breathing. Clear to auscultation on both sides.  GASTROINTESTINAL: Soft abdomen, nontender. Bowel sounds present. No hepatosplenomegaly palpable.  GENITOURINARY: No CVA tenderness or bladder distention.  SKIN: Warm and dry. No petechiae, rash, ulcers.  MUSCULOSKELETAL: No joint swelling, redness of any large joints. Normal muscle tone.  NEUROLOGICAL: Motor strength 5/5  in upper and lower extremities. Sensation to fine touch intact all over.  LYMPHATIC: No cervical lymphadenopathy.   LABORATORY STUDIES: Show:  Glucose 237, BUN 24, creatinine 1.42, GFR 48. AST, ALT, alkaline phosphatase, bilirubin normal.   Troponin less than 0.02.  WBC 8.3, hemoglobin 15.4, platelets of 182.  EKG shows normal sinus with first-degree AV block. Right bundle branch, left anterior fascicular block, unchanged from prior EKG.  Chest, PA and lateral, shows no acute abnormalities.   ASSESSMENT AND PLAN:  1. Unstable angina in a patient with coronary artery disease and prior coronary artery bypass grafting. Will consult cardiology for a cardiac catheterization. Get 2 more sets of cardiac enzymes. The patient does have chronic kidney disease, and his creatinine seems to be at baseline, around 1.43. This will likely worsen and needs to be monitored as outpatient. The patient will be on a telemetry floor. Will check 2 more sets of cardiac enzymes. Continue his aspirin, statin and beta blocker. Will hold the ARB he is on due to the contrast load he is going to get.  2. Hypertension. Continue medications.  3. Diabetes mellitus. Continue the oral hypoglycemics and put him on a sliding scale insulin. 4. Chronic vertigo, stable.  5. Deep vein thrombosis prophylaxis. Lovenox.  CODE STATUS: Full code.   TIME SPENT TODAY ON THIS CASE: 50 minutes.   ____________________________ Leia Alf Mordecai Tindol, MD srs:lb D: 08/31/2013 10:48:38 ET T: 08/31/2013 11:23:38 ET JOB#: [id number]  cc: Alveta Heimlich R. Bebe Moncure, MD, <Dictator> Leona Carry. Hall Busing, MD Javier Docker Ubaldo Glassing, MD Neita Carp MD ELECTRONICALLY SIGNED 09/30/2013 14:10

## 2014-08-26 NOTE — Consult Note (Signed)
Chief Complaint:  Subjective/Chief Complaint Pt doing well and is ready to go home. No cp no sob. He states to not be abler to get off omeprazole .   VITAL SIGNS/ANCILLARY NOTES: **Vital Signs.:   01-May-15 08:00  Vital Signs Type Routine  Temperature Temperature (F) 98.5  Celsius 36.9  Temperature Source oral  Pulse Pulse 68  Respirations Respirations 18  Systolic BP Systolic BP 395  Diastolic BP (mmHg) Diastolic BP (mmHg) 69  Mean BP 90  Pulse Ox % Pulse Ox % 93  Pulse Ox Activity Level  At rest  Oxygen Delivery Room Air/ 21 %  Pulse Ox Heart Rate 68  *Intake and Output.:   01-May-15 08:00  Grand Totals Intake:  100 Output:  250    Net:  -150 32 Hr.:  -150  IV (Primary)      In:  100  Urine ml     Out:  250  Urinary Method  Urinal   Brief Assessment:  GEN well developed, well nourished, no acute distress   Cardiac Regular  murmur present   Respiratory normal resp effort   Gastrointestinal Normal   Gastrointestinal details normal Soft  Nontender  Nondistended  Bowel sounds normal   Lab Results: Cardiology:  30-Apr-15 12:34   Ventricular Rate 70  Atrial Rate 70  P-R Interval 248  QRS Duration 148  QT 416  QTc 449  P Axis 56  R Axis 121  T Axis 63  ECG interpretation Sinus rhythm with 1st degree A-V block Right bundle branch block Abnormal ECG When compared with ECG of 31-Aug-2013 07:21, Minimal criteria for Inferior infarct are no longer Present ----------unconfirmed---------- Confirmed by OVERREAD, NOT ([id number]), editor PEARSON, BARBARA ([id number]) on 09/01/2013 1:50:53 PM  Routine Chem:  30-Apr-15 04:08   Glucose, Serum  148  BUN  25  Creatinine (comp)  1.32  Sodium, Serum 137  Potassium, Serum 4.5  CO2, Serum 28  Calcium (Total), Serum 9.1  Anion Gap  6  Osmolality (calc) 281  eGFR (African American) >60  eGFR (Non-African American)  53 (eGFR values <55m/min/1.73 m2 may be an indication of chronic kidney disease (CKD). Calculated eGFR is useful in  patients with stable renal function. The eGFR calculation will not be reliable in acutely ill patients when serum creatinine is changing rapidly. It is not useful in  patients on dialysis. The eGFR calculation may not be applicable to patients at the low and high extremes of body sizes, pregnant women, and vegetarians.)  Result Comment POTASSIUM - Slight hemolysis, interpret results with  - caution..Marland Kitchenpl  Result(s) reported on 01 Sep 2013 at 05:18AM.   Radiology Results: XRay:    29-Apr-15 08:48, Chest PA and Lateral  Chest PA and Lateral   REASON FOR EXAM:    chest pain  COMMENTS:       PROCEDURE: DXR - DXR CHEST PA (OR AP) AND LATERAL  - Aug 31 2013  8:48AM     CLINICAL DATA:  Intermittent chest pain for 3 weeks.    EXAM:  CHEST  2 VIEW    COMPARISON:  10/27/2012    FINDINGS:  Sequelae of prior CABG are again identified. Cardiac silhouette is  within normal limits in size for degree of inspiration. The patient  has taken a shallower inspiration than on the prior study. Minimal  linear opacities are present in the lung bases. No confluent  airspace consolidation, edema, pleural effusion, or pneumothorax is  identified. No acute osseous abnormality is identified.  IMPRESSION:  Minimal bibasilar atelectasis.      Electronically Signed    By: Logan Bores    On: 08/31/2013 08:57         Verified By: Ferol Luz, M.D.,  Cardiology:    29-Apr-15 07:21, ED ECG  Ventricular Rate 76  Atrial Rate 76  P-R Interval 254  QRS Duration 148  QT 404  QTc 454  P Axis 45  R Axis 149  T Axis 50  ECG interpretation   Sinus rhythm with 1st degree A-V block  Right bundle branch block  Left posterior fascicular block  *** Bifascicular block ***  Cannot rule out Inferior infarct (cited on or before 31-Aug-2013)  Abnormal ECG  When compared with ECG of 31-Aug-2013 07:21,  No significant change was found  ----------unconfirmed----------  Confirmed by OVERREAD, NOT ([id number]),  editor PEARSON, BARBARA ([id number]) on 09/01/2013 1:54:25 PM  ED ECG     30-Apr-15 12:34, ECG  Ventricular Rate 70  Atrial Rate 70  P-R Interval 248  QRS Duration 148  QT 416  QTc 449  P Axis 56  R Axis 121  T Axis 63  ECG interpretation   Sinus rhythm with 1st degree A-V block  Right bundle branch block  Abnormal ECG  When compared with ECG of 31-Aug-2013 07:21,  Minimal criteria for Inferior infarct are no longer Present  ----------unconfirmed----------  Confirmed by OVERREAD, NOT ([id number]), editor PEARSON, BARBARA ([id number]) on 09/01/2013 1:50:53 PM  ECG    Assessment/Plan:  Assessment/Plan:  Assessment IMP CAD Angina HTN Hyperlipidemia Hx CABG Abn Ekg S/P PCI stent DES circ CRI  .   Plan PLAN asa 325 mg daily D/C plavix since the pt is unwilling to try zantac only Resume Brilinta 90 mg po bid x 62yrAdd imdur 30 mg daily Continue b-blocker Statin therapy Increase activity F/U with nephology F/U with Vascular for RAS Hopefully d/c home today [facility] F/U wiith Fath in 1-2 weeks   Electronic Signatures: CLujean AmelD (MD)  (Signed 01-May-15 12:49)  Authored: Chief Complaint, VITAL SIGNS/ANCILLARY NOTES, Brief Assessment, Lab Results, Radiology Results, Assessment/Plan   Last Updated: 01-May-15 12:49 by CYolonda Kida(MD)

## 2014-12-25 ENCOUNTER — Other Ambulatory Visit: Payer: Self-pay | Admitting: Vascular Surgery

## 2014-12-25 DIAGNOSIS — I714 Abdominal aortic aneurysm, without rupture, unspecified: Secondary | ICD-10-CM

## 2015-01-01 ENCOUNTER — Ambulatory Visit
Admission: RE | Admit: 2015-01-01 | Discharge: 2015-01-01 | Disposition: A | Discharge status: Home / Self Care | Payer: Medicare HMO | Source: Ambulatory Visit | Attending: Vascular Surgery | Admitting: Vascular Surgery

## 2015-01-01 ENCOUNTER — Other Ambulatory Visit: Payer: Self-pay | Admitting: Vascular Surgery

## 2015-01-01 DIAGNOSIS — I714 Abdominal aortic aneurysm, without rupture, unspecified: Secondary | ICD-10-CM

## 2015-01-01 DIAGNOSIS — K4090 Unilateral inguinal hernia, without obstruction or gangrene, not specified as recurrent: Secondary | ICD-10-CM | POA: Insufficient documentation

## 2015-10-04 ENCOUNTER — Encounter (INDEPENDENT_AMBULATORY_CARE_PROVIDER_SITE_OTHER): Payer: Medicare HMO | Admitting: Ophthalmology

## 2015-10-04 DIAGNOSIS — H35033 Hypertensive retinopathy, bilateral: Secondary | ICD-10-CM | POA: Diagnosis not present

## 2015-10-04 DIAGNOSIS — H353134 Nonexudative age-related macular degeneration, bilateral, advanced atrophic with subfoveal involvement: Secondary | ICD-10-CM

## 2015-10-04 DIAGNOSIS — H43813 Vitreous degeneration, bilateral: Secondary | ICD-10-CM

## 2015-10-04 DIAGNOSIS — I10 Essential (primary) hypertension: Secondary | ICD-10-CM

## 2015-10-17 ENCOUNTER — Encounter (INDEPENDENT_AMBULATORY_CARE_PROVIDER_SITE_OTHER): Payer: Medicare HMO | Admitting: Ophthalmology

## 2016-01-31 ENCOUNTER — Encounter: Payer: Self-pay | Admitting: *Deleted

## 2016-03-06 ENCOUNTER — Ambulatory Visit: Payer: Self-pay | Admitting: General Surgery

## 2016-05-30 ENCOUNTER — Ambulatory Visit (INDEPENDENT_AMBULATORY_CARE_PROVIDER_SITE_OTHER): Payer: Medicare HMO | Admitting: Podiatry

## 2016-05-30 ENCOUNTER — Encounter: Payer: Self-pay | Admitting: Podiatry

## 2016-05-30 VITALS — BP 144/78 | HR 79 | Resp 16

## 2016-05-30 DIAGNOSIS — L603 Nail dystrophy: Secondary | ICD-10-CM | POA: Diagnosis not present

## 2016-05-30 DIAGNOSIS — E0843 Diabetes mellitus due to underlying condition with diabetic autonomic (poly)neuropathy: Secondary | ICD-10-CM

## 2016-05-30 DIAGNOSIS — L03031 Cellulitis of right toe: Secondary | ICD-10-CM

## 2016-05-30 DIAGNOSIS — L608 Other nail disorders: Secondary | ICD-10-CM | POA: Diagnosis not present

## 2016-05-30 DIAGNOSIS — M79609 Pain in unspecified limb: Secondary | ICD-10-CM

## 2016-05-30 DIAGNOSIS — B351 Tinea unguium: Secondary | ICD-10-CM | POA: Diagnosis not present

## 2016-05-30 MED ORDER — DOXYCYCLINE HYCLATE 100 MG PO TABS: 100.0000 mg | ORAL_TABLET | Freq: Two times a day (BID) | ORAL | 0 refills | Status: DC

## 2016-05-30 NOTE — Progress Notes (Signed)
   Subjective:    Patient ID: Robert Conrad, male    DOB: 1940-06-05, 76 y.o.   MRN: [id number]  HPI    Review of Systems  HENT: Positive for tinnitus.   All other systems reviewed and are negative.      Objective:   Physical Exam        Assessment & Plan:

## 2016-06-08 NOTE — Progress Notes (Signed)
Patient ID: Robert Conrad, male   DOB: 13-Sep-1940, 76 y.o.   MRN: [id number]   SUBJECTIVE Patient with a history of diabetes mellitus presents to office today complaining of elongated, thickened nails. Pain while ambulating in shoes. Patient is unable to trim their own nails.   OBJECTIVE General Patient is awake, alert, and oriented x 3 and in no acute distress. Derm Skin is dry and supple bilateral. Negative open lesions or macerations. Remaining integument unremarkable. Nails are tender, long, thickened and dystrophic with subungual debris, consistent with onychomycosis, 1-5 bilateral. No signs of infection noted. Vasc  DP and PT pedal pulses palpable bilaterally. Temperature gradient within normal limits. Cellulitis with warmth noted to the second digit right foot. No open wounds noted. Neuro Epicritic and protective threshold sensation diminished bilaterally.  Musculoskeletal Exam No symptomatic pedal deformities noted bilateral. Muscular strength within normal limits.  ASSESSMENT 1. Diabetes Mellitus w/ peripheral neuropathy 2. 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 Pain in foot bilateral 4. Cellulitis of the second digit right foot  PLAN OF CARE 1. Patient evaluated today. 2. Instructed to maintain good pedal hygiene and foot care. Stressed importance of controlling blood sugar.  3. Mechanical debridement of nails 1-5 bilaterally performed using a nail nipper. Filed with dremel without incident.  4. Prescription for doxycycline 100 mg 5. Return to clinic in 3 mos.     Edrick Kins, DPM [facility]  Dr. Edrick Kins, [address]                                        [place],  [address]                Office [phone]  Fax [phone]

## 2016-06-11 ENCOUNTER — Ambulatory Visit (INDEPENDENT_AMBULATORY_CARE_PROVIDER_SITE_OTHER): Payer: Medicare HMO | Admitting: General Surgery

## 2016-06-11 ENCOUNTER — Encounter: Payer: Self-pay | Admitting: General Surgery

## 2016-06-11 VITALS — BP 142/62 | HR 84 | Resp 14 | Ht 68.0 in | Wt 211.0 lb

## 2016-06-11 DIAGNOSIS — Z86010 Personal history of colonic polyps: Secondary | ICD-10-CM

## 2016-06-11 MED ORDER — POLYETHYLENE GLYCOL 3350 17 GM/SCOOP PO POWD: ORAL | 0 refills | Status: DC

## 2016-06-11 NOTE — Patient Instructions (Signed)
Colonoscopy, Adult A colonoscopy is an exam to look at the entire large intestine. During the exam, a lubricated, bendable tube is inserted into the anus and then passed into the rectum, colon, and other parts of the large intestine. A colonoscopy is often done as a part of normal colorectal screening or in response to certain symptoms, such as anemia, persistent diarrhea, abdominal pain, and blood in the stool. The exam can help screen for and diagnose medical problems, including:  Tumors.  Polyps.  Inflammation.  Areas of bleeding. Tell a health care provider about:  Any allergies you have.  All medicines you are taking, including vitamins, herbs, eye drops, creams, and over-the-counter medicines.  Any problems you or family members have had with anesthetic medicines.  Any blood disorders you have.  Any surgeries you have had.  Any medical conditions you have.  Any problems you have had passing stool. What are the risks? Generally, this is a safe procedure. However, problems may occur, including:  Bleeding.  A tear in the intestine.  A reaction to medicines given during the exam.  Infection (rare). What happens before the procedure? Eating and drinking restrictions  Follow instructions from your health care provider about eating and drinking, which may include:  A few days before the procedure - follow a low-fiber diet. Avoid nuts, seeds, dried fruit, raw fruits, and vegetables.  1-3 days before the procedure - follow a clear liquid diet. Drink only clear liquids, such as clear broth or bouillon, black coffee or tea, clear juice, clear soft drinks or sports drinks, gelatin desert, and popsicles. Avoid any liquids that contain red or purple dye.  On the day of the procedure - do not eat or drink anything during the 2 hours before the procedure, or within the time period that your health care provider recommends. Bowel prep  If you were prescribed an oral bowel prep to  clean out your colon:  Take it as told by your health care provider. Starting the day before your procedure, you will need to drink a large amount of medicated liquid. The liquid will cause you to have multiple loose stools until your stool is almost clear or light green.  If your skin or anus gets irritated from diarrhea, you may use these to relieve the irritation:  Medicated wipes, such as adult wet wipes with aloe and vitamin E.  A skin soothing-product like petroleum jelly.  If you vomit while drinking the bowel prep, take a break for up to 60 minutes and then begin the bowel prep again. If vomiting continues and you cannot take the bowel prep without vomiting, call your health care provider. General instructions  Ask your health care provider about changing or stopping your regular medicines. This is especially important if you are taking diabetes medicines or blood thinners.  Plan to have someone take you home from the hospital or clinic. What happens during the procedure?  An IV tube may be inserted into one of your veins.  You will be given medicine to help you relax (sedative).  To reduce your risk of infection:  Your health care team will wash or sanitize their hands.  Your anal area will be washed with soap.  You will be asked to lie on your side with your knees bent.  Your health care provider will lubricate a long, thin, flexible tube. The tube will have a camera and a light on the end.  The tube will be inserted into your anus.  The tube will be gently eased through your rectum and colon.  Air will be delivered into your colon to keep it open. You may feel some pressure or cramping.  The camera will be used to take images during the procedure.  A small tissue sample may be removed from your body to be examined under a microscope (biopsy). If any potential problems are found, the tissue will be sent to a lab for testing.  If small polyps are found, your health  care provider may remove them and have them checked for cancer cells.  The tube that was inserted into your anus will be slowly removed. The procedure may vary among health care providers and hospitals. What happens after the procedure?  Your blood pressure, heart rate, breathing rate, and blood oxygen level will be monitored until the medicines you were given have worn off.  Do not drive for 24 hours after the exam.  You may have a small amount of blood in your stool.  You may pass gas and have mild abdominal cramping or bloating due to the air that was used to inflate your colon during the exam.  It is up to you to get the results of your procedure. Ask your health care provider, or the department performing the procedure, when your results will be ready. This information is not intended to replace advice given to you by your health care provider. Make sure you discuss any questions you have with your health care provider. Document Released: 04/18/2000 Document Revised: 11/09/2015 Document Reviewed: 07/03/2015 Elsevier Interactive Patient Education  2017 [employer].

## 2016-06-11 NOTE — Progress Notes (Signed)
Patient ID: Robert Conrad, male   DOB: 05-02-1941, 76 y.o.   MRN: [id number]  Chief Complaint  Patient presents with  . Colonoscopy    HPI Robert Conrad is a 76 y.o. male here today for a evaluation of a colonoscopy. Last colonoscopy was done in 2012. Patient state he had no GI problems at this time. Patient has an abdominal aortic aneurysm and chronic renal failure, which is being followed. Last ultrasound done by Dr. Delana Meyer showed the AAA was stable at 4.3 cm. States that he has been troubled with his right hand contracting at night, he hasn't been able to stretch his hand out well. Has not seen anyone for this problem before, states it just recently began. I have reviewed the history of present illness with the patient.   HPI  Past Medical History:  Diagnosis Date  . 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 Arthritis   . Bowel trouble 1999  . Diabetes mellitus without complication ([place]) 1975   non insulin dependent  . Diverticulitis   . Eye problems 2010  . GERD (gastroesophageal reflux disease) 2009  . Gout 2009  . Hearing loss   . Heart disease 1990  . Hyperlipidemia 2009  . Hypertension 2009  . Obesity, unspecified   . Personal history of colonic polyps   . Personal history of tobacco use, presenting hazards to health   . Renal insufficiency   . Special screening for malignant neoplasms, colon   . Stroke [facility]) 2009  . Ulcer ([place]) 2009   stomach    Past Surgical History:  Procedure Laterality Date  . CARDIAC CATHETERIZATION  09-30-13  . COLONOSCOPY  2009  . COLONOSCOPY  03-07-11   Dr Jamal Collin  . COLONOSCOPY  03/07/2011  . 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 EYE SURGERY  2009  . HERNIA REPAIR  2010   ventral and umbilical   . STOMACH SURGERY     sigmoid colon resection    Family History  Problem Relation Age of Onset  . Cancer Mother   . Heart attack Father     Social History Social History  Substance Use Topics  . Smoking status:  Never Smoker  . Smokeless tobacco: Never Used     Comment: quit in 1990  . Alcohol use No    Allergies  Allergen Reactions  . Ciprofloxacin Hcl Rash    Current Outpatient Prescriptions  Medication Sig Dispense Refill  . aspirin 81 MG tablet Take 81 mg by mouth daily.    . benazepril (LOTENSIN) 20 MG tablet Take 20 mg by mouth daily.     Marland Kitchen doxycycline (VIBRA-TABS) 100 MG tablet Take 1 tablet (100 mg total) by mouth 2 (two) times daily. 20 tablet 0  . glimepiride (AMARYL) 4 MG tablet Take 4 mg by mouth daily before breakfast.     . hydroxypropyl methylcellulose (ISOPTO TEARS) 2.5 % ophthalmic solution 1 drop.    . metFORMIN (GLUCOPHAGE) 500 MG tablet Take 500 mg by mouth.    . metFORMIN (GLUCOPHAGE-XR) 500 MG 24 hr tablet Take 1,000 mg by mouth daily with breakfast.     . metoprolol succinate (TOPROL-XL) 25 MG 24 hr tablet Take 25 mg by mouth daily.     . naproxen (NAPROSYN) 500 MG tablet Take 500 mg by mouth daily.     Marland Kitchen omeprazole (PRILOSEC) 20 MG capsule Take 20 mg by mouth daily.     . ONE TOUCH ULTRA TEST test strip     .  ONETOUCH DELICA LANCETS 87F MISC     . simvastatin (ZOCOR) 20 MG tablet Take 20 mg by mouth daily.     Nelva Nay SOLOSTAR 300 UNIT/ML SOPN     . polyethylene glycol powder (GLYCOLAX/MIRALAX) powder 255 grams one bottle for colonoscopy prep 255 g 0   No current facility-administered medications for this visit.     Review of Systems Review of Systems  Constitutional: Negative.   Respiratory: Negative.   Cardiovascular: Negative.   Gastrointestinal: Negative.     Blood pressure (!) 142/62, pulse 84, resp. rate 14, height 5\' 8"  (1.727 m), weight 211 lb (95.7 kg).  Physical Exam Physical Exam  Constitutional: He is oriented to person, place, and time. He appears well-developed and well-nourished.  Eyes: Conjunctivae are normal. No scleral icterus.  Neck: Neck supple. No thyromegaly present.  Cardiovascular: Normal rate, regular rhythm and normal heart  sounds.   Pulmonary/Chest: Effort normal and breath sounds normal.  Abdominal: Soft. Bowel sounds are normal. Hernia confirmed negative in the right inguinal area and confirmed negative in the left inguinal area.    Lymphadenopathy:    He has no cervical adenopathy.  Neurological: He is alert and oriented to person, place, and time.  Skin: Skin is warm and dry.    Data Reviewed Notes reviewed   Assessment    Stable exam  Personal history of colonic polyps Postoperative sigmoid colon resection. Suspected Dupuytren's contracture of the right hand.    Plan    Advised patient to follow-up with Dr. Hall Busing about possible Dupuytren's contracture of the right hand.  Colonoscopy with possible biopsy/polypectomy prn: Information regarding the procedure, including its potential risks and complications (including but not limited to perforation of the bowel, which may require emergency surgery to repair, and bleeding) was verbally given to the patient. Educational information regarding lower intestinal endoscopy was given to the patient. Written instructions for how to complete the bowel prep using Miralax were provided. The importance of drinking ample fluids to avoid dehydration as a result of the prep emphasized.  Patient has been scheduled for a colonoscopy on 07-16-16 at [facility]. It is okay for patient to continue 81 mg aspirin once daily. Patient has been asked to hold metformin and glimepiride day of colonoscopy prep and procedure.   This information has been scribed by Gaspar Cola CMA.        SANKAR,SEEPLAPUTHUR G 06/11/2016, 4:40 PM

## 2016-06-18 ENCOUNTER — Encounter: Payer: Self-pay | Admitting: General Surgery

## 2016-07-11 ENCOUNTER — Other Ambulatory Visit: Payer: Self-pay | Admitting: General Surgery

## 2016-07-16 ENCOUNTER — Encounter
Admission: RE | Disposition: A | Discharge status: Home / Self Care | Payer: Self-pay | Source: Ambulatory Visit | Attending: General Surgery

## 2016-07-16 ENCOUNTER — Ambulatory Visit: Payer: Medicare HMO | Admitting: Certified Registered"

## 2016-07-16 ENCOUNTER — Encounter: Payer: Self-pay | Admitting: *Deleted

## 2016-07-16 ENCOUNTER — Ambulatory Visit
Admission: RE | Admit: 2016-07-16 | Discharge: 2016-07-16 | Disposition: A | Discharge status: Home / Self Care | Payer: Medicare HMO | Source: Ambulatory Visit | Attending: General Surgery | Admitting: General Surgery

## 2016-07-16 DIAGNOSIS — N289 Disorder of kidney and ureter, unspecified: Secondary | ICD-10-CM | POA: Diagnosis not present

## 2016-07-16 DIAGNOSIS — Z1211 Encounter for screening for malignant neoplasm of colon: Secondary | ICD-10-CM | POA: Insufficient documentation

## 2016-07-16 DIAGNOSIS — Z7982 Long term (current) use of aspirin: Secondary | ICD-10-CM | POA: Insufficient documentation

## 2016-07-16 DIAGNOSIS — I10 Essential (primary) hypertension: Secondary | ICD-10-CM | POA: Diagnosis not present

## 2016-07-16 DIAGNOSIS — K635 Polyp of colon: Secondary | ICD-10-CM | POA: Diagnosis not present

## 2016-07-16 DIAGNOSIS — Z8673 Personal history of transient ischemic attack (TIA), and cerebral infarction without residual deficits: Secondary | ICD-10-CM | POA: Diagnosis not present

## 2016-07-16 DIAGNOSIS — E669 Obesity, unspecified: Secondary | ICD-10-CM | POA: Insufficient documentation

## 2016-07-16 DIAGNOSIS — E785 Hyperlipidemia, unspecified: Secondary | ICD-10-CM | POA: Insufficient documentation

## 2016-07-16 DIAGNOSIS — K219 Gastro-esophageal reflux disease without esophagitis: Secondary | ICD-10-CM | POA: Insufficient documentation

## 2016-07-16 DIAGNOSIS — Z951 Presence of aortocoronary bypass graft: Secondary | ICD-10-CM | POA: Insufficient documentation

## 2016-07-16 DIAGNOSIS — I739 Peripheral vascular disease, unspecified: Secondary | ICD-10-CM | POA: Diagnosis not present

## 2016-07-16 DIAGNOSIS — Z79899 Other long term (current) drug therapy: Secondary | ICD-10-CM | POA: Diagnosis not present

## 2016-07-16 DIAGNOSIS — Z86010 Personal history of colonic polyps: Secondary | ICD-10-CM | POA: Insufficient documentation

## 2016-07-16 DIAGNOSIS — K5730 Diverticulosis of large intestine without perforation or abscess without bleeding: Secondary | ICD-10-CM | POA: Insufficient documentation

## 2016-07-16 DIAGNOSIS — E119 Type 2 diabetes mellitus without complications: Secondary | ICD-10-CM | POA: Insufficient documentation

## 2016-07-16 DIAGNOSIS — M109 Gout, unspecified: Secondary | ICD-10-CM | POA: Insufficient documentation

## 2016-07-16 DIAGNOSIS — I252 Old myocardial infarction: Secondary | ICD-10-CM | POA: Insufficient documentation

## 2016-07-16 DIAGNOSIS — M1990 Unspecified osteoarthritis, unspecified site: Secondary | ICD-10-CM | POA: Diagnosis not present

## 2016-07-16 DIAGNOSIS — D124 Benign neoplasm of descending colon: Secondary | ICD-10-CM | POA: Insufficient documentation

## 2016-07-16 DIAGNOSIS — D125 Benign neoplasm of sigmoid colon: Secondary | ICD-10-CM | POA: Diagnosis not present

## 2016-07-16 DIAGNOSIS — I714 Abdominal aortic aneurysm, without rupture: Secondary | ICD-10-CM | POA: Insufficient documentation

## 2016-07-16 DIAGNOSIS — Z7984 Long term (current) use of oral hypoglycemic drugs: Secondary | ICD-10-CM | POA: Insufficient documentation

## 2016-07-16 HISTORY — DX: Cerebral infarction, unspecified: I63.9

## 2016-07-16 HISTORY — PX: COLONOSCOPY WITH PROPOFOL: SHX5780

## 2016-07-16 LAB — GLUCOSE, CAPILLARY: Glucose-Capillary: 189 mg/dL — ABNORMAL HIGH (ref 65–99)

## 2016-07-16 SURGERY — COLONOSCOPY WITH PROPOFOL
Anesthesia: General

## 2016-07-16 MED ORDER — EPHEDRINE SULFATE 50 MG/ML IJ SOLN
INTRAMUSCULAR | Status: AC
  Filled 2016-07-16: qty 1

## 2016-07-16 MED ORDER — PROPOFOL 10 MG/ML IV BOLUS
INTRAVENOUS | Status: DC | PRN
  Administered 2016-07-16: 20 mg via INTRAVENOUS
  Administered 2016-07-16: 50 mg via INTRAVENOUS

## 2016-07-16 MED ORDER — SODIUM CHLORIDE 0.9 % IV SOLN
INTRAVENOUS | Status: DC
  Administered 2016-07-16: 10:00:00 via INTRAVENOUS

## 2016-07-16 MED ORDER — PROPOFOL 500 MG/50ML IV EMUL
INTRAVENOUS | Status: AC
  Filled 2016-07-16: qty 50

## 2016-07-16 MED ORDER — SODIUM CHLORIDE 0.9 % IJ SOLN
INTRAMUSCULAR | Status: AC
  Filled 2016-07-16: qty 10

## 2016-07-16 MED ORDER — EPHEDRINE SULFATE 50 MG/ML IJ SOLN
INTRAMUSCULAR | Status: DC | PRN
  Administered 2016-07-16: 10 mg via INTRAVENOUS

## 2016-07-16 MED ORDER — LIDOCAINE 2% (20 MG/ML) 5 ML SYRINGE
INTRAMUSCULAR | Status: DC | PRN
  Administered 2016-07-16: 25 mg via INTRAVENOUS

## 2016-07-16 MED ORDER — PHENYLEPHRINE HCL 10 MG/ML IJ SOLN
INTRAMUSCULAR | Status: DC | PRN
  Administered 2016-07-16 (×3): 100 ug via INTRAVENOUS

## 2016-07-16 MED ORDER — PROPOFOL 500 MG/50ML IV EMUL
INTRAVENOUS | Status: DC | PRN
  Administered 2016-07-16: 120 ug/kg/min via INTRAVENOUS

## 2016-07-16 NOTE — Anesthesia Postprocedure Evaluation (Signed)
Anesthesia Post Note  Patient: Robert Conrad  Procedure(s) Performed: Procedure(s) (LRB): COLONOSCOPY WITH PROPOFOL (N/A)  Patient location during evaluation: Endoscopy Anesthesia Type: General Level of consciousness: awake and alert Pain management: pain level controlled Vital Signs Assessment: post-procedure vital signs reviewed and stable Respiratory status: spontaneous breathing and respiratory function stable Cardiovascular status: stable Anesthetic complications: no     Last Vitals:  Vitals:   07/16/16 1049 07/16/16 1109  BP: 105/66 108/62  Pulse: 76 77  Resp: 17 (!) 27  Temp: (!) 35.7 C     Last Pain:  Vitals:   07/16/16 1049  TempSrc: Tympanic                 KEPHART,WILLIAM K

## 2016-07-16 NOTE — Anesthesia Post-op Follow-up Note (Cosign Needed)
Anesthesia QCDR form completed.        

## 2016-07-16 NOTE — Op Note (Signed)
[facility] Patient Name: Robert Conrad Procedure Date: 07/16/2016 10:06 AM MRN: [id number] Account #: [id number] Date of Birth: 11/08/1940 Admit Type: Outpatient Age: 76 Room: [facility] ENDO ROOM 1 Gender: Male Note Status: Finalized Procedure:            Colonoscopy Indications:          High risk colon cancer surveillance: Personal history                        of colonic polyps Providers:            Glenice Ciccone G. Jamal Collin, MD Referring MD:         Leona Carry. Hall Busing, MD (Referring MD) Medicines:            General Anesthesia Complications:        No immediate complications. Procedure:            Pre-Anesthesia Assessment:                       - General anesthesia under the supervision of an                        anesthesiologist was determined to be medically                        necessary for this procedure based on review of the                        patient's medical history, medications, and prior                        anesthesia history.                       After obtaining informed consent, the colonoscope was                        passed under direct vision. Throughout the procedure,                        the patient's blood pressure, pulse, and oxygen                        saturations were monitored continuously. The                        Colonoscope was introduced through the anus and                        advanced to the the cecum, identified by the ileocecal                        valve. The colonoscopy was performed with ease. The                        patient tolerated the procedure well. The quality of                        the bowel preparation was good. Findings:      The perianal and digital rectal examinations were normal.      Two sessile  polyps were found in the proximal sigmoid colon. The polyps       were 3 mm in size. These polyps were removed with a hot biopsy forceps.       Resection and retrieval were  complete.      A 5 mm polyp was found in the descending colon. The polyp was       semi-pedunculated. The polyp was removed with a hot snare. Resection and       retrieval were complete.      The exam was otherwise without abnormality on direct and retroflexion       views. Impression:           - Two 3 mm polyps in the proximal sigmoid colon,                        removed with a hot biopsy forceps. Resected and                        retrieved.                       - One 5 mm polyp in the descending colon, removed with                        a hot snare. Resected and retrieved.                       - The examination was otherwise normal on direct and                        retroflexion views. Recommendation:       - Discharge patient to home.                       - Resume previous diet.                       - Continue present medications.                       - Await pathology results.                       - Repeat colonoscopy in 5 years for surveillance. Procedure Code(s):    --- Professional ---                       [phone], Colonoscopy, flexible; with removal of tumor(s),                        polyp(s), or other lesion(s) by snare technique                       45384, 59, Colonoscopy, flexible; with removal of                        tumor(s), polyp(s), or other lesion(s) by hot biopsy                        forceps Diagnosis Code(s):    --- Professional ---  Z86.010, Personal history of colonic polyps                       D12.5, Benign neoplasm of sigmoid colon                       D12.4, Benign neoplasm of descending colon CPT copyright 2016 American Medical Association. All rights reserved. The codes documented in this report are preliminary and upon coder review may  be revised to meet current compliance requirements. Christene Lye, MD 07/16/2016 10:49:16 AM This report has been signed electronically. Number of Addenda: 0 Note Initiated  On: 07/16/2016 10:06 AM Scope Withdrawal Time: 0 hours 4 minutes 12 seconds  Total Procedure Duration: 0 hours 23 minutes 35 seconds       [facility]

## 2016-07-16 NOTE — Interval H&P Note (Signed)
History and Physical Interval Note:  07/16/2016 10:16 AM  Robert Conrad  has presented today for surgery, with the diagnosis of HX COLON POLYPS  The various methods of treatment have been discussed with the patient and family. After consideration of risks, benefits and other options for treatment, the patient has consented to  Procedure(s): COLONOSCOPY WITH PROPOFOL (N/A) as a surgical intervention .  The patient's history has been reviewed, patient examined, no change in status, stable for surgery.  I have reviewed the patient's chart and labs.  Questions were answered to the patient's satisfaction.     SANKAR,SEEPLAPUTHUR G

## 2016-07-16 NOTE — H&P (Signed)
Robert Conrad is an 76 y.o. male.   Chief Complaint: here for colonoscopy HPI: 76 yr old male with history of colon polyps, s/p sigmoid resection yrs ago. No GI symptoms. Last colonoscopy 5 yrs ago  Past Medical History:  Diagnosis Date  . 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 Arthritis   . Bowel trouble 1999  . Diabetes mellitus without complication ([place]) 7412   non insulin dependent  . Diverticulitis   . Eye problems 2010  . GERD (gastroesophageal reflux disease) 2009  . Gout 2009  . Hearing loss   . Heart disease 1990  . Hyperlipidemia 2009  . Hypertension 2009  . Obesity, unspecified   . Personal history of colonic polyps   . Personal history of tobacco use, presenting hazards to health   . Renal insufficiency   . Special screening for malignant neoplasms, colon   . Stroke (cerebrum) ([place])   . Stroke [facility]) 2009  . Ulcer ([place]) 2009   stomach    Past Surgical History:  Procedure Laterality Date  . CARDIAC CATHETERIZATION  09-30-13  . COLONOSCOPY  2009  . COLONOSCOPY  03-07-11   Dr Jamal Collin  . COLONOSCOPY  03/07/2011  . 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 CORONARY ARTERY BYPASS GRAFT    . EYE SURGERY  2009  . HERNIA REPAIR  2010   ventral and umbilical   . STOMACH SURGERY     sigmoid colon resection    Family History  Problem Relation Age of Onset  . Cancer Mother   . Heart attack Father    Social History:  reports that he has never smoked. He has never used smokeless tobacco. He reports that he does not drink alcohol or use drugs.  Allergies:  Allergies  Allergen Reactions  . Ciprofloxacin Hcl Rash    Medications Prior to Admission  Medication Sig Dispense Refill  . aspirin 81 MG tablet Take 81 mg by mouth daily.    Marland Kitchen glimepiride (AMARYL) 4 MG tablet Take 4 mg by mouth daily before breakfast.     . hydroxypropyl methylcellulose (ISOPTO TEARS) 2.5 % ophthalmic solution 1 drop.    . metFORMIN (GLUCOPHAGE-XR) 500 MG 24 hr tablet Take  1,000 mg by mouth daily with breakfast.     . metoprolol succinate (TOPROL-XL) 25 MG 24 hr tablet Take 25 mg by mouth daily.     Marland Kitchen omeprazole (PRILOSEC) 20 MG capsule Take 20 mg by mouth daily.     Glory Rosebush DELICA LANCETS 87O MISC     . simvastatin (ZOCOR) 20 MG tablet Take 20 mg by mouth daily.     . benazepril (LOTENSIN) 20 MG tablet Take 20 mg by mouth daily.     Marland Kitchen doxycycline (VIBRA-TABS) 100 MG tablet Take 1 tablet (100 mg total) by mouth 2 (two) times daily. 20 tablet 0  . metFORMIN (GLUCOPHAGE) 500 MG tablet Take 500 mg by mouth.    . naproxen (NAPROSYN) 500 MG tablet Take 500 mg by mouth daily.     . ONE TOUCH ULTRA TEST test strip     . polyethylene glycol powder (GLYCOLAX/MIRALAX) powder 255 grams one bottle for colonoscopy prep 255 g 0  . TOUJEO SOLOSTAR 300 UNIT/ML SOPN       Results for orders placed or performed during the hospital encounter of 07/16/16 (from the past 48 hour(s))  Glucose, capillary     Status: Abnormal   Collection Time: 07/16/16  8:58 AM  Result  Value Ref Range   Glucose-Capillary 189 (H) 65 - 99 mg/dL   No results found.  Review of Systems  Constitutional: Negative.   Respiratory: Negative.   Cardiovascular: Negative.   Genitourinary: Negative.     Blood pressure 113/71, pulse 81, temperature (!) 96.7 F (35.9 C), temperature source Tympanic, resp. rate 18, height 5\' 9"  (1.753 m), weight 210 lb (95.3 kg), SpO2 94 %. Physical Exam  Constitutional: He is oriented to person, place, and time. He appears well-developed and well-nourished.  Eyes: Conjunctivae are normal. No scleral icterus.  Neck: Neck supple.  Cardiovascular: Normal rate, regular rhythm and normal heart sounds.   Respiratory: Effort normal and breath sounds normal.  GI: Soft. Bowel sounds are normal. He exhibits no distension and no mass. There is no tenderness.  Lymphadenopathy:    He has no cervical adenopathy.  Neurological: He is alert and oriented to person, place, and time.   Skin: Skin is warm and dry.     Assessment/Plan History of colon polyps. Proceed with colonoscopy as planned  Christene Lye, MD 07/16/2016, 10:13 AM

## 2016-07-16 NOTE — Anesthesia Preprocedure Evaluation (Signed)
Anesthesia Evaluation  Patient identified by MRN, date of birth, ID band Patient awake    Reviewed: Allergy & Precautions, NPO status , Patient's Chart, lab work & pertinent test results  History of Anesthesia Complications Negative for: history of anesthetic complications  Airway Mallampati: III       Dental   Pulmonary neg pulmonary ROS,           Cardiovascular hypertension, Pt. on medications and Pt. on home beta blockers + CAD, + Past MI, + Cardiac Stents, + CABG and + Peripheral Vascular Disease       Neuro/Psych CVA (rigth sided weakness, speech difficulties, resolved), No Residual Symptoms    GI/Hepatic Neg liver ROS, GERD  Medicated and Controlled,  Endo/Other  diabetes, Type 2, Oral Hypoglycemic Agents  Renal/GU Renal InsufficiencyRenal disease     Musculoskeletal   Abdominal   Peds  Hematology   Anesthesia Other Findings   Reproductive/Obstetrics                             Anesthesia Physical Anesthesia Plan  ASA: III  Anesthesia Plan: General   Post-op Pain Management:    Induction: Intravenous  Airway Management Planned: Oral ETT  Additional Equipment:   Intra-op Plan:   Post-operative Plan:   Informed Consent: I have reviewed the patients History and Physical, chart, labs and discussed the procedure including the risks, benefits and alternatives for the proposed anesthesia with the patient or authorized representative who has indicated his/her understanding and acceptance.     Plan Discussed with:   Anesthesia Plan Comments:         Anesthesia Quick Evaluation

## 2016-07-16 NOTE — Transfer of Care (Signed)
Immediate Anesthesia Transfer of Care Note  Patient: Jaylon Boylen Ziegler  Procedure(s) Performed: Procedure(s): COLONOSCOPY WITH PROPOFOL (N/A)  Patient Location: Endoscopy Unit  Anesthesia Type:General  Level of Consciousness: awake  Airway & Oxygen Therapy: Patient Spontanous Breathing and Patient connected to nasal cannula oxygen  Post-op Assessment: Report given to RN and Post -op Vital signs reviewed and stable  Post vital signs: Reviewed  Last Vitals:  Vitals:   07/16/16 0854 07/16/16 1049  BP: 113/71 105/66  Pulse: 81 76  Resp: 18 17  Temp: (!) 35.9 C (!) 35.7 C    Last Pain:  Vitals:   07/16/16 0854  TempSrc: Tympanic         Complications: No apparent anesthesia complications

## 2016-07-17 ENCOUNTER — Encounter: Payer: Self-pay | Admitting: General Surgery

## 2016-07-17 LAB — SURGICAL PATHOLOGY

## 2016-07-22 ENCOUNTER — Telehealth: Payer: Self-pay | Admitting: *Deleted

## 2016-07-22 NOTE — Telephone Encounter (Signed)
-----   Message from Christene Lye, MD sent at 07/18/2016  7:50 AM EDT ----- Please inform pt- polyps were benign

## 2016-07-22 NOTE — Telephone Encounter (Signed)
Notified patient as instructed, patient pleased. Discussed follow-up appointments, patient agrees  

## 2016-07-28 ENCOUNTER — Ambulatory Visit (INDEPENDENT_AMBULATORY_CARE_PROVIDER_SITE_OTHER): Payer: Medicare HMO | Admitting: Vascular Surgery

## 2016-07-28 ENCOUNTER — Other Ambulatory Visit (INDEPENDENT_AMBULATORY_CARE_PROVIDER_SITE_OTHER): Payer: Medicare HMO

## 2016-08-26 ENCOUNTER — Encounter: Payer: Self-pay | Admitting: General Surgery

## 2016-08-29 ENCOUNTER — Encounter: Payer: Self-pay | Admitting: Podiatry

## 2016-08-29 ENCOUNTER — Ambulatory Visit (INDEPENDENT_AMBULATORY_CARE_PROVIDER_SITE_OTHER): Payer: Medicare HMO | Admitting: Podiatry

## 2016-08-29 DIAGNOSIS — M79609 Pain in unspecified limb: Secondary | ICD-10-CM

## 2016-08-29 DIAGNOSIS — L603 Nail dystrophy: Secondary | ICD-10-CM

## 2016-08-29 DIAGNOSIS — L608 Other nail disorders: Secondary | ICD-10-CM

## 2016-08-29 DIAGNOSIS — E0842 Diabetes mellitus due to underlying condition with diabetic polyneuropathy: Secondary | ICD-10-CM

## 2016-08-29 DIAGNOSIS — B351 Tinea unguium: Secondary | ICD-10-CM

## 2016-08-30 NOTE — Progress Notes (Signed)
   SUBJECTIVE Patient with a history of diabetes mellitus presents to office today complaining of elongated, thickened nails. Pain while ambulating in shoes. Patient is unable to trim their own nails.   OBJECTIVE General Patient is awake, alert, and oriented x 3 and in no acute distress. Derm Skin is dry and supple bilateral. Negative open lesions or macerations. Remaining integument unremarkable. Nails are tender, long, thickened and dystrophic with subungual debris, consistent with onychomycosis, 1-5 bilateral. No signs of infection noted. Vasc  DP and PT pedal pulses palpable bilaterally. Temperature gradient within normal limits.  Neuro Epicritic and protective threshold sensation diminished bilaterally.  Musculoskeletal Exam No symptomatic pedal deformities noted bilateral. Muscular strength within normal limits.  ASSESSMENT 1. Diabetes Mellitus w/ peripheral neuropathy 2. 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 Pain in foot bilateral  PLAN OF CARE 1. Patient evaluated today. 2. Instructed to maintain good pedal hygiene and foot care. Stressed importance of controlling blood sugar.  3. Mechanical debridement of nails 1-5 bilaterally performed using a nail nipper. Filed with dremel without incident.  4. Silicone toe cap dispensed for right second toe. 5. Return to clinic in 3 mos.     Edrick Kins, DPM [facility]  Dr. Edrick Kins, [address]                                        [place],  [address]                Office [phone]  Fax [phone]

## 2016-09-25 ENCOUNTER — Ambulatory Visit (INDEPENDENT_AMBULATORY_CARE_PROVIDER_SITE_OTHER): Payer: Medicare HMO | Admitting: Vascular Surgery

## 2016-09-25 ENCOUNTER — Other Ambulatory Visit (INDEPENDENT_AMBULATORY_CARE_PROVIDER_SITE_OTHER): Payer: Medicare HMO

## 2016-11-28 ENCOUNTER — Ambulatory Visit: Payer: Medicare HMO | Admitting: Podiatry

## 2016-12-17 DIAGNOSIS — E119 Type 2 diabetes mellitus without complications: Secondary | ICD-10-CM | POA: Insufficient documentation

## 2016-12-17 DIAGNOSIS — I10 Essential (primary) hypertension: Secondary | ICD-10-CM | POA: Insufficient documentation

## 2016-12-17 DIAGNOSIS — E785 Hyperlipidemia, unspecified: Secondary | ICD-10-CM | POA: Insufficient documentation

## 2016-12-17 DIAGNOSIS — I739 Peripheral vascular disease, unspecified: Secondary | ICD-10-CM | POA: Insufficient documentation

## 2016-12-17 NOTE — Progress Notes (Signed)
MRN : [id number]  Robert Conrad is a 76 y.o. ([birth year]) male who presents with chief complaint of No chief complaint on file. Marland Kitchen  History of Present Illness:   The patient returns to the office for surveillance of a known abdominal aortic aneurysm. Patient denies abdominal pain or back pain, no other abdominal complaints. No changes suggesting embolic episodes.   There have been no interval changes in the patient's overall health care since his last visit.  Patient denies amaurosis fugax or TIA symptoms. There is no history of claudication or rest pain symptoms of the lower extremities. The patient denies angina or shortness of breath.   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  No outpatient prescriptions have been marked as taking for the 12/18/16 encounter (Appointment) with Delana Meyer, Dolores Lory, MD.    Past Medical History:  Diagnosis Date  . 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 Arthritis   . Bowel trouble 1999  . Diabetes mellitus without complication ([place]) 6503   non insulin dependent  . Diverticulitis   . Eye problems 2010  . GERD (gastroesophageal reflux disease) 2009  . Gout 2009  . Hearing loss   . Heart disease 1990  . Hyperlipidemia 2009  . Hypertension 2009  . Obesity, unspecified   . Personal history of colonic polyps   . Personal history of tobacco use, presenting hazards to health   . Renal insufficiency   . Special screening for malignant neoplasms, colon   . Stroke (cerebrum) ([place])   . Stroke [facility]) 2009  . Ulcer 2009   stomach    Past Surgical History:  Procedure Laterality Date  . CARDIAC CATHETERIZATION  09-30-13  . COLONOSCOPY  2009  . COLONOSCOPY  03-07-11   Dr Jamal Collin  . COLONOSCOPY  03/07/2011  . COLONOSCOPY WITH PROPOFOL N/A 07/16/2016   Procedure: COLONOSCOPY WITH PROPOFOL;  Surgeon: Christene Lye, MD;  Location: [facility]  ENDOSCOPY;  Service: Endoscopy;  Laterality: N/A;  . 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 CORONARY ARTERY BYPASS GRAFT    . EYE SURGERY  2009  . HERNIA REPAIR  2010   ventral and umbilical   . STOMACH SURGERY     sigmoid colon resection    Social History Social History  Substance Use Topics  . Smoking status: Never Smoker  . Smokeless tobacco: Never Used     Comment: quit in 1990  . Alcohol use No    Family History Family History  Problem Relation Age of Onset  . Cancer Mother   . Heart attack Father     Allergies  Allergen Reactions  . Ciprofloxacin Hcl Rash     REVIEW OF SYSTEMS (Negative unless checked)  Constitutional: [] Weight loss  [] Fever  [] Chills Cardiac: [] Chest pain   [] Chest pressure   [] Palpitations   [] Shortness of breath when laying flat   [] Shortness of breath with exertion. Vascular:  [x] Pain in legs with walking   [] Pain in legs at rest  [] History of DVT   [] Phlebitis   [] Swelling in legs   [] Varicose veins   [] Non-healing ulcers Pulmonary:   [] Uses home oxygen   [] Productive cough   [] Hemoptysis   [] Wheeze  [] COPD   [] Asthma Neurologic:  [] Dizziness   [] Seizures   [] History of stroke   [] History of TIA  [] Aphasia   [] Vissual changes   [] Weakness or  numbness in arm   [] Weakness or numbness in leg Musculoskeletal:   [] Joint swelling   [x] Joint pain   [] Low back pain Hematologic:  [] Easy bruising  [] Easy bleeding   [] Hypercoagulable state   [] Anemic Gastrointestinal:  [] Diarrhea   [] Vomiting  [] Gastroesophageal reflux/heartburn   [] Difficulty swallowing. Genitourinary:  [] Chronic kidney disease   [] Difficult urination  [] Frequent urination   [] Blood in urine Skin:  [] Rashes   [] Ulcers  Psychological:  [] History of anxiety   []  History of major depression.  Physical Examination  There were no vitals filed for this visit. There is no height or weight on file to calculate BMI. Gen: WD/WN, NAD Head: [place]/AT, No temporalis wasting.  Ear/Nose/Throat:  Hearing grossly intact, nares w/o erythema or drainage Eyes: PER, EOMI, sclera nonicteric.  Neck: Supple, no large masses.   Pulmonary:  Good air movement, no audible wheezing bilaterally, no use of accessory muscles.  Cardiac: RRR, no JVD Vascular: Feet pink and warm with good capillary refill Vessel Right Left  Radial Palpable Palpable  PT Trace Palpable Trace Palpable  DP Trace Palpable Trace Palpable  Gastrointestinal: Non-distended. No guarding/no peritoneal signs.  Musculoskeletal: M/S 5/5 throughout.  No deformity or atrophy.  Neurologic: CN 2-12 intact. Symmetrical.  Speech is fluent. Motor exam as listed above. Psychiatric: Judgment intact, Mood & affect appropriate for pt's clinical situation. Dermatologic: No rashes or ulcers noted.  No changes consistent with cellulitis. Lymph : No lichenification or skin changes of chronic lymphedema.  CBC Lab Results  Component Value Date   WBC 8.3 08/31/2013   HGB 15.4 08/31/2013   HCT 46.9 08/31/2013   MCV 87 08/31/2013   PLT 182 08/31/2013    BMET    Component Value Date/Time   NA 139 11/22/2013 1147   K 4.7 11/22/2013 1147   CL 104 11/22/2013 1147   CO2 28 11/22/2013 1147   GLUCOSE 131 (H) 11/22/2013 1147   BUN 22 (H) 11/22/2013 1147   CREATININE 1.62 (H) 11/22/2013 1147   CALCIUM 8.7 11/22/2013 1147   GFRNONAA 41 (L) 11/22/2013 1147   GFRAA 48 (L) 11/22/2013 1147   CrCl cannot be calculated (Patient's most recent lab result is older than the maximum 21 days allowed.).  COAG No results found for: INR, PROTIME  Radiology No results found.  Assessment/Plan 1. Abdominal aortic aneurysm (AAA) without rupture ([place]) No surgery or intervention at this time. The patient has an asymptomatic abdominal aortic aneurysm that is less than 4 cm in maximal diameter.  I have discussed the natural history of abdominal aortic aneurysm and the small risk of rupture for aneurysm less than 5 cm in size.  However, as these small  aneurysms tend to enlarge over time, continued surveillance with ultrasound or CT scan is mandatory.  I have also discussed optimizing medical management with hypertension and lipid control and the importance of abstinence from tobacco.  The patient is also encouraged to exercise a minimum of 30 minutes 4 times a week.  Should the patient develop new onset abdominal or back pain or signs of peripheral embolization they are instructed to seek medical attention immediately and to alert the physician providing care that they have an aneurysm.  The patient voices their understanding. The patient will return in 12 months with an aortic duplex.  - VAS US AORTA/IVC/ILIACS; Future  2. Renal artery stenosis (HCC) Continue to follow blood pressure as long as his hypertension is well controlled no intervention is required  3. PAD (peripheral artery disease) ([place])  Recommend:  The patient has evidence of atherosclerosis of the lower extremities with claudication.  The patient does not voice lifestyle limiting changes at this point in time.  Noninvasive studies do not suggest clinically significant change.  No invasive studies, angiography or surgery at this time The patient should continue walking and begin a more formal exercise program.  The patient should continue antiplatelet therapy and aggressive treatment of the lipid abnormalities  No changes in the patient's medications at this time  The patient should continue wearing graduated compression socks 10-15 mmHg strength to control the mild edema.    4. Type 2 diabetes mellitus with diabetic peripheral angiopathy without gangrene, with long-term current use of insulin ([place]) Continue hypoglycemic medications as already ordered, these medications have been reviewed and there are no changes at this time.  Hgb A1C to be monitored as already arranged by primary service   5. Essential hypertension Continue antihypertensive medications as already  ordered, these medications have been reviewed and there are no changes at this time.   6. Mixed hyperlipidemia Continue statin as ordered and reviewed, no changes at this time     Hortencia Pilar, MD  12/17/2016 12:53 PM

## 2016-12-18 ENCOUNTER — Encounter (INDEPENDENT_AMBULATORY_CARE_PROVIDER_SITE_OTHER): Payer: Self-pay | Admitting: Vascular Surgery

## 2016-12-18 ENCOUNTER — Other Ambulatory Visit (INDEPENDENT_AMBULATORY_CARE_PROVIDER_SITE_OTHER): Payer: Self-pay | Admitting: Vascular Surgery

## 2016-12-18 ENCOUNTER — Ambulatory Visit (INDEPENDENT_AMBULATORY_CARE_PROVIDER_SITE_OTHER): Payer: Medicare HMO | Admitting: Vascular Surgery

## 2016-12-18 ENCOUNTER — Ambulatory Visit (INDEPENDENT_AMBULATORY_CARE_PROVIDER_SITE_OTHER): Payer: Medicare HMO

## 2016-12-18 VITALS — BP 130/75 | HR 66 | Resp 16 | Ht 69.0 in | Wt 208.0 lb

## 2016-12-18 DIAGNOSIS — I10 Essential (primary) hypertension: Secondary | ICD-10-CM

## 2016-12-18 DIAGNOSIS — E782 Mixed hyperlipidemia: Secondary | ICD-10-CM | POA: Diagnosis not present

## 2016-12-18 DIAGNOSIS — I714 Abdominal aortic aneurysm, without rupture, unspecified: Secondary | ICD-10-CM

## 2016-12-18 DIAGNOSIS — I739 Peripheral vascular disease, unspecified: Secondary | ICD-10-CM | POA: Diagnosis not present

## 2016-12-18 DIAGNOSIS — Z794 Long term (current) use of insulin: Secondary | ICD-10-CM

## 2016-12-18 DIAGNOSIS — I701 Atherosclerosis of renal artery: Secondary | ICD-10-CM

## 2016-12-18 DIAGNOSIS — E1151 Type 2 diabetes mellitus with diabetic peripheral angiopathy without gangrene: Secondary | ICD-10-CM

## 2017-11-26 ENCOUNTER — Ambulatory Visit (INDEPENDENT_AMBULATORY_CARE_PROVIDER_SITE_OTHER): Payer: Medicare HMO | Admitting: Vascular Surgery

## 2017-11-26 ENCOUNTER — Other Ambulatory Visit (INDEPENDENT_AMBULATORY_CARE_PROVIDER_SITE_OTHER): Payer: Self-pay | Admitting: Vascular Surgery

## 2017-11-26 ENCOUNTER — Encounter

## 2017-11-26 ENCOUNTER — Ambulatory Visit (INDEPENDENT_AMBULATORY_CARE_PROVIDER_SITE_OTHER): Payer: Medicare HMO

## 2017-11-26 ENCOUNTER — Encounter (INDEPENDENT_AMBULATORY_CARE_PROVIDER_SITE_OTHER): Payer: Self-pay | Admitting: Vascular Surgery

## 2017-11-26 VITALS — BP 134/71 | HR 74 | Resp 14 | Ht 66.5 in | Wt 205.0 lb

## 2017-11-26 DIAGNOSIS — I714 Abdominal aortic aneurysm, without rupture, unspecified: Secondary | ICD-10-CM

## 2017-11-26 DIAGNOSIS — Z974 Presence of external hearing-aid: Secondary | ICD-10-CM

## 2017-11-26 DIAGNOSIS — I10 Essential (primary) hypertension: Secondary | ICD-10-CM | POA: Diagnosis not present

## 2017-11-26 DIAGNOSIS — E782 Mixed hyperlipidemia: Secondary | ICD-10-CM

## 2017-11-26 DIAGNOSIS — E1151 Type 2 diabetes mellitus with diabetic peripheral angiopathy without gangrene: Secondary | ICD-10-CM | POA: Diagnosis not present

## 2017-11-26 DIAGNOSIS — Z794 Long term (current) use of insulin: Secondary | ICD-10-CM

## 2017-11-26 NOTE — Progress Notes (Signed)
Subjective:    Patient ID: Robert Conrad, male    DOB: Dec 21, 1940, 77 y.o.   MRN: [id number] Chief Complaint  Patient presents with  . Follow-up    1 year AAA   The patient presents for yearly AAA follow up. He underwent an aortic duplex which was notable for an abdominal aortic aneurysm measuring 4.2cm AP x 4.1cm transverse (previous 4.3cm on 12/18/16). He denies any symptoms such as back pain, pulsatile abdominal masses or thrombosis in his extremities.  He denies any claudication-like symptoms, rest pain or ulcer formation to the bilateral lower extremity.  Patient denies any fever, nausea vomiting. His hypertension is adequately controlled and he has remained abstinent of tobacco use.   Review of Systems  Constitutional: Negative.   HENT: Negative.   Eyes: Negative.   Respiratory: Negative.   Cardiovascular: Negative.   Gastrointestinal: Negative.   Endocrine: Negative.   Genitourinary: Negative.   Musculoskeletal: Negative.   Skin: Negative.   Allergic/Immunologic: Negative.   Neurological: Negative.   Hematological: Negative.   Psychiatric/Behavioral: Negative.       Objective:   Physical Exam  Constitutional: He is oriented to person, place, and time. He appears well-developed and well-nourished. No distress.  HENT:  Head: Normocephalic and atraumatic.  Right Ear: External ear normal.  Left Ear: External ear normal.  Eyes: Pupils are equal, round, and reactive to light. Conjunctivae and EOM are normal.  Neck: Normal range of motion.  Cardiovascular: Normal rate, regular rhythm, normal heart sounds and intact distal pulses.  Pulses:      Radial pulses are 2+ on the right side, and 2+ on the left side.       Dorsalis pedis pulses are 1+ on the right side, and 1+ on the left side.       Posterior tibial pulses are 1+ on the right side, and 1+ on the left side.  Pulmonary/Chest: Effort normal and breath sounds normal.  Musculoskeletal: Normal range of motion. He  exhibits no edema.  Neurological: He is alert and oriented to person, place, and time.  Skin: Skin is warm and dry. He is not diaphoretic.  Psychiatric: He has a normal mood and affect. His behavior is normal. Judgment and thought content normal.  Vitals reviewed.  BP 134/71 (BP Location: Right Arm, Patient Position: Sitting)   Pulse 74   Resp 14   Ht 5' 6.5" (1.689 m)   Wt 205 lb (93 kg)   BMI 32.59 kg/m   Past Medical History:  Diagnosis Date  . 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 Arthritis   . Bowel trouble 1999  . Diabetes mellitus without complication ([place]) 6387   non insulin dependent  . Diverticulitis   . Eye problems 2010  . GERD (gastroesophageal reflux disease) 2009  . Gout 2009  . Hearing loss   . Heart disease 1990  . Hyperlipidemia 2009  . Hypertension 2009  . Obesity, unspecified   . Personal history of colonic polyps   . Personal history of tobacco use, presenting hazards to health   . Renal insufficiency   . Special screening for malignant neoplasms, colon   . Stroke (cerebrum) ([place])   . Stroke [facility]) 2009  . Ulcer 2009   stomach   Social History   Socioeconomic History  . Marital status: Married    Spouse name: Not on file  . Number of children: Not on file  . Years of education: Not on file  . Highest  education level: Not on file  Occupational History  . Not on file  Social Needs  . Financial resource strain: Not on file  . Food insecurity:    Worry: Not on file    Inability: Not on file  . Transportation needs:    Medical: Not on file    Non-medical: Not on file  Tobacco Use  . Smoking status: Never Smoker  . Smokeless tobacco: Never Used  . Tobacco comment: quit in 1990  Substance and Sexual Activity  . Alcohol use: No  . Drug use: No  . Sexual activity: Not on file  Lifestyle  . Physical activity:    Days per week: Not on file    Minutes per session: Not on file  . Stress: Not on file  Relationships  . Social  connections:    Talks on phone: Not on file    Gets together: Not on file    Attends religious service: Not on file    Active member of club or organization: Not on file    Attends meetings of clubs or organizations: Not on file    Relationship status: Not on file  . Intimate partner violence:    Fear of current or ex partner: Not on file    Emotionally abused: Not on file    Physically abused: Not on file    Forced sexual activity: Not on file  Other Topics Concern  . Not on file  Social History Narrative  . Not on file   Past Surgical History:  Procedure Laterality Date  . CARDIAC CATHETERIZATION  09-30-13  . COLONOSCOPY  2009  . COLONOSCOPY  03-07-11   Dr Jamal Collin  . COLONOSCOPY  03/07/2011  . COLONOSCOPY WITH PROPOFOL N/A 07/16/2016   Procedure: COLONOSCOPY WITH PROPOFOL;  Surgeon: Christene Lye, MD;  Location: [facility] ENDOSCOPY;  Service: Endoscopy;  Laterality: N/A;  . 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 CORONARY ARTERY BYPASS GRAFT    . EYE SURGERY  2009  . HERNIA REPAIR  2010   ventral and umbilical   . STOMACH SURGERY     sigmoid colon resection   Family History  Problem Relation Age of Onset  . Cancer Mother   . Heart attack Father    Allergies  Allergen Reactions  . Ciprofloxacin Hcl Rash      Assessment & Plan:  The patient presents for yearly AAA follow up. He underwent an aortic duplex which was notable for an abdominal aortic aneurysm measuring 4.2cm AP x 4.1cm transverse (previous 4.3cm on 12/18/16). He denies any symptoms such as back pain, pulsatile abdominal masses or thrombosis in his extremities.  He denies any claudication-like symptoms, rest pain or ulcer formation to the bilateral lower extremity.  Patient denies any fever, nausea vomiting. His hypertension is adequately controlled and he has remained abstinent of tobacco use.   1. Essential hypertension - Stable Encouraged good control as its slows the progression of atherosclerotic  disease  2. Type 2 diabetes mellitus with diabetic peripheral angiopathy without gangrene, with long-term current use of insulin (HCC) - Stable Encouraged good control as its slows the progression of atherosclerotic disease  3. Mixed hyperlipidemia - Stable Encouraged good control as its slows the progression of atherosclerotic disease  4. AAA - Stable Studies reviewed with patient. Duplex stable, physical exam unremarkable. No surgery or intervention at this time. The patient to follow up in one year with an aortic duplex. The patient has an asymptomatic abdominal aortic  aneurysm that is less than 4 cm in maximal diameter.  I have reviewed the natural history of abdominal aortic aneurysm and the small risk of rupture for aneurysm less than 5 cm in size.  However, as these small aneurysms tend to enlarge over time, continued surveillance with ultrasound or CT scan is mandatory.  The patient's blood pressure is being adequately controlled however I have reviewed the importance of hypertension and lipid control and the importance of continuing his abstinence from tobacco.  The patient is also encouraged to exercise a minimum of 30 minutes 4 times a week.  Should the patient develop new onset abdominal or back pain or signs of peripheral embolization they are instructed to seek medical attention immediately and to alert the physician providing care that they have an aneurysm.  The patient voices their understanding.  - VAS [place] AAA DUPLEX; Future  Current Outpatient Medications on File Prior to Visit  Medication Sig Dispense Refill  . aspirin 81 MG tablet Take 81 mg by mouth daily.    . benazepril (LOTENSIN) 20 MG tablet Take 20 mg by mouth daily.     Marland Kitchen doxycycline (VIBRA-TABS) 100 MG tablet Take 1 tablet (100 mg total) by mouth 2 (two) times daily. 20 tablet 0  . glimepiride (AMARYL) 4 MG tablet Take 4 mg by mouth daily before breakfast.     . hydroxypropyl methylcellulose (ISOPTO TEARS) 2.5 %  ophthalmic solution 1 drop.    . metFORMIN (GLUCOPHAGE-XR) 500 MG 24 hr tablet Take 1,000 mg by mouth daily with breakfast.     . metoprolol succinate (TOPROL-XL) 25 MG 24 hr tablet Take 25 mg by mouth daily.     . naproxen (NAPROSYN) 500 MG tablet Take 500 mg by mouth daily.     Marland Kitchen omeprazole (PRILOSEC) 20 MG capsule Take 20 mg by mouth daily.     . ONE TOUCH ULTRA TEST test strip     . ONETOUCH DELICA LANCETS 59Y MISC     . polyethylene glycol powder (GLYCOLAX/MIRALAX) powder 255 grams one bottle for colonoscopy prep 255 g 0  . simvastatin (ZOCOR) 20 MG tablet Take 20 mg by mouth daily.     Nelva Nay SOLOSTAR 300 UNIT/ML SOPN     . traMADol (ULTRAM) 50 MG tablet Take by mouth.     No current facility-administered medications on file prior to visit.    There are no Patient Instructions on file for this visit. No follow-ups on file.  Jozelynn Danielson A Yaquelin Langelier, PA-C

## 2017-12-21 ENCOUNTER — Ambulatory Visit (INDEPENDENT_AMBULATORY_CARE_PROVIDER_SITE_OTHER): Payer: Medicare HMO | Admitting: Vascular Surgery

## 2017-12-21 ENCOUNTER — Other Ambulatory Visit (INDEPENDENT_AMBULATORY_CARE_PROVIDER_SITE_OTHER): Payer: Medicare HMO

## 2018-04-22 ENCOUNTER — Ambulatory Visit
Admission: RE | Admit: 2018-04-22 | Discharge: 2018-04-22 | Disposition: A | Discharge status: Home / Self Care | Payer: Medicare HMO | Source: Ambulatory Visit | Attending: Internal Medicine | Admitting: Internal Medicine

## 2018-04-22 ENCOUNTER — Other Ambulatory Visit: Payer: Self-pay | Admitting: Internal Medicine

## 2018-04-22 DIAGNOSIS — R059 Cough, unspecified: Secondary | ICD-10-CM

## 2018-04-22 DIAGNOSIS — R05 Cough: Secondary | ICD-10-CM

## 2018-11-29 ENCOUNTER — Ambulatory Visit (INDEPENDENT_AMBULATORY_CARE_PROVIDER_SITE_OTHER): Payer: Medicare HMO

## 2018-11-29 ENCOUNTER — Ambulatory Visit (INDEPENDENT_AMBULATORY_CARE_PROVIDER_SITE_OTHER): Payer: Medicare HMO | Admitting: Vascular Surgery

## 2018-11-29 ENCOUNTER — Encounter (INDEPENDENT_AMBULATORY_CARE_PROVIDER_SITE_OTHER): Payer: Self-pay | Admitting: Vascular Surgery

## 2018-11-29 ENCOUNTER — Other Ambulatory Visit: Payer: Self-pay

## 2018-11-29 VITALS — BP 100/66 | HR 98 | Resp 16 | Wt 190.8 lb

## 2018-11-29 DIAGNOSIS — I714 Abdominal aortic aneurysm, without rupture, unspecified: Secondary | ICD-10-CM

## 2018-11-29 DIAGNOSIS — I739 Peripheral vascular disease, unspecified: Secondary | ICD-10-CM | POA: Diagnosis not present

## 2018-11-29 DIAGNOSIS — I10 Essential (primary) hypertension: Secondary | ICD-10-CM | POA: Diagnosis not present

## 2018-11-29 DIAGNOSIS — E1151 Type 2 diabetes mellitus with diabetic peripheral angiopathy without gangrene: Secondary | ICD-10-CM

## 2018-11-29 DIAGNOSIS — I701 Atherosclerosis of renal artery: Secondary | ICD-10-CM | POA: Diagnosis not present

## 2018-11-29 DIAGNOSIS — Z79899 Other long term (current) drug therapy: Secondary | ICD-10-CM

## 2018-11-29 DIAGNOSIS — Z794 Long term (current) use of insulin: Secondary | ICD-10-CM

## 2018-11-29 NOTE — Progress Notes (Signed)
MRN : [id number]  Robert Conrad is a 78 y.o. (11/01/1940) male who presents with chief complaint of No chief complaint on file. Marland Kitchen  History of Present Illness:   The patient returns to the office for surveillance of a known abdominal aortic aneurysm. Patient denies abdominal pain or back pain, no other abdominal complaints. No changes suggesting embolic episodes.   There have been no interval changes in the patient's overall health care since his last visit.  Patient denies amaurosis fugax or TIA symptoms. There is no history of claudication or rest pain symptoms of the lower extremities. The patient denies angina or shortness of breath.   Duplex US of the aorta and iliac arteries shows an AAA measured 4.6 cm with 1.0 and 1.4 left and right respectively iliac artery aneurysms.  Duplex US of the aorta and iliac arteries shows an AAA measured 4.3 cm with 1.4 and 1.1 right and left respectively iliac artery aneurysms.  No outpatient medications have been marked as taking for the 11/29/18 encounter (Appointment) with Delana Meyer, Dolores Lory, MD.    Past Medical History:  Diagnosis Date  . 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 Arthritis   . Bowel trouble 1999  . Diabetes mellitus without complication ([place]) 4665   non insulin dependent  . Diverticulitis   . Eye problems 2010  . GERD (gastroesophageal reflux disease) 2009  . Gout 2009  . Hearing loss   . Heart disease 1990  . Hyperlipidemia 2009  . Hypertension 2009  . Obesity, unspecified   . Personal history of colonic polyps   . Personal history of tobacco use, presenting hazards to health   . Renal insufficiency   . Special screening for malignant neoplasms, colon   . Stroke (cerebrum) ([place])   . Stroke [facility]) 2009  . Ulcer 2009   stomach    Past Surgical History:  Procedure Laterality Date  . CARDIAC CATHETERIZATION  09-30-13  . COLONOSCOPY  2009  . COLONOSCOPY  03-07-11   Dr Jamal Collin  . COLONOSCOPY   03/07/2011  . COLONOSCOPY WITH PROPOFOL N/A 07/16/2016   Procedure: COLONOSCOPY WITH PROPOFOL;  Surgeon: Christene Lye, MD;  Location: [facility] ENDOSCOPY;  Service: Endoscopy;  Laterality: N/A;  . 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 CORONARY ARTERY BYPASS GRAFT    . EYE SURGERY  2009  . HERNIA REPAIR  2010   ventral and umbilical   . STOMACH SURGERY     sigmoid colon resection    Social History Social History   Tobacco Use  . Smoking status: Never Smoker  . Smokeless tobacco: Never Used  . Tobacco comment: quit in 1990  Substance Use Topics  . Alcohol use: No  . Drug use: No    Family History Family History  Problem Relation Age of Onset  . Cancer Mother   . Heart attack Father     Allergies  Allergen Reactions  . Ciprofloxacin Hcl Rash     REVIEW OF SYSTEMS (Negative unless checked)  Constitutional: [] Weight loss  [] Fever  [] Chills Cardiac: [] Chest pain   [] Chest pressure   [] Palpitations   [] Shortness of breath when laying flat   [] Shortness of breath with exertion. Vascular:  [] Pain in legs with walking   [] Pain in legs at rest  [] History of DVT   [] Phlebitis   [] Swelling in legs   [] Varicose veins   [] Non-healing ulcers Pulmonary:   [] Uses home oxygen   [] Productive cough   [] Hemoptysis   []   Wheeze  [] COPD   [] Asthma Neurologic:  [] Dizziness   [] Seizures   [] History of stroke   [] History of TIA  [] Aphasia   [] Vissual changes   [] Weakness or numbness in arm   [] Weakness or numbness in leg Musculoskeletal:   [] Joint swelling   [] Joint pain   [] Low back pain Hematologic:  [] Easy bruising  [] Easy bleeding   [] Hypercoagulable state   [] Anemic Gastrointestinal:  [] Diarrhea   [] Vomiting  [] Gastroesophageal reflux/heartburn   [] Difficulty swallowing. Genitourinary:  [] Chronic kidney disease   [] Difficult urination  [] Frequent urination   [] Blood in urine Skin:  [] Rashes   [] Ulcers  Psychological:  [] History of anxiety   []  History of major depression.  Physical  Examination  There were no vitals filed for this visit. There is no height or weight on file to calculate BMI. Gen: WD/WN, NAD Head: [place]/AT, No temporalis wasting.  Ear/Nose/Throat: Hearing grossly intact, nares w/o erythema or drainage Eyes: PER, EOMI, sclera nonicteric.  Neck: Supple, no large masses.   Pulmonary:  Good air movement, no audible wheezing bilaterally, no use of accessory muscles.  Cardiac: RRR, no JVD Vascular:  Vessel Right Left  Radial Palpable Palpable  PT Not Palpable Not Palpable  DP Not Palpable Not Palpable  Gastrointestinal: Non-distended. No guarding/no peritoneal signs.  Musculoskeletal: M/S 5/5 throughout.  No deformity or atrophy.  Neurologic: CN 2-12 intact. Symmetrical.  Speech is fluent. Motor exam as listed above. Psychiatric: Judgment intact, Mood & affect appropriate for pt's clinical situation. Dermatologic: No rashes or ulcers noted.  No changes consistent with cellulitis. Lymph : No lichenification or skin changes of chronic lymphedema.  CBC Lab Results  Component Value Date   WBC 8.3 08/31/2013   HGB 15.4 08/31/2013   HCT 46.9 08/31/2013   MCV 87 08/31/2013   PLT 182 08/31/2013    BMET    Component Value Date/Time   NA 139 11/22/2013 1147   K 4.7 11/22/2013 1147   CL 104 11/22/2013 1147   CO2 28 11/22/2013 1147   GLUCOSE 131 (H) 11/22/2013 1147   BUN 22 (H) 11/22/2013 1147   CREATININE 1.62 (H) 11/22/2013 1147   CALCIUM 8.7 11/22/2013 1147   GFRNONAA 41 (L) 11/22/2013 1147   GFRAA 48 (L) 11/22/2013 1147   CrCl cannot be calculated (Patient's most recent lab result is older than the maximum 21 days allowed.).  COAG No results found for: INR, PROTIME  Radiology No results found.    Assessment/Plan 1. Abdominal aortic aneurysm (AAA) without rupture ([place]) No surgery or intervention at this time. The patient has an asymptomatic abdominal aortic aneurysm that is less than 5 cm in maximal diameter.  I have discussed the  natural history of abdominal aortic aneurysm and the small risk of rupture for aneurysm less than 5 cm in size.  However, as these small aneurysms tend to enlarge over time, continued surveillance with ultrasound or CT scan is mandatory.  I have also discussed optimizing medical management with hypertension and lipid control and the importance of abstinence from tobacco.  The patient is also encouraged to exercise a minimum of 30 minutes 4 times a week.  Should the patient develop new onset abdominal or back pain or signs of peripheral embolization they are instructed to seek medical attention immediately and to alert the physician providing care that they have an aneurysm.  The patient voices their understanding. The patient will return in 12 months with an aortic duplex.  - VAS US AORTA/IVC/ILIACS; Future  2. PAD (  peripheral artery disease) (HCC)  Recommend:  The patient has evidence of atherosclerosis of the lower extremities with claudication.  The patient does not voice lifestyle limiting changes at this point in time.  Noninvasive studies do not suggest clinically significant change.  No invasive studies, angiography or surgery at this time The patient should continue walking and begin a more formal exercise program.  The patient should continue antiplatelet therapy and aggressive treatment of the lipid abnormalities  No changes in the patient's medications at this time  The patient should continue wearing graduated compression socks 10-15 mmHg strength to control the mild edema.    3. Renal artery stenosis (HCC) Patient's blood pressure is within the goal range of the American Heart Association.  At present he is on only 1 antihypertensive medication.  No renal artery intervention is indicated at this time  4. Essential hypertension Continue antihypertensive medications as already ordered, these medications have been reviewed and there are no changes at this time.   5. Type 2  diabetes mellitus with diabetic peripheral angiopathy without gangrene, with long-term current use of insulin ([place]) Continue hypoglycemic medications as already ordered, these medications have been reviewed and there are no changes at this time.  Hgb A1C to be monitored as already arranged by primary service     Hortencia Pilar, MD  11/29/2018 8:43 AM

## 2018-12-07 ENCOUNTER — Encounter: Payer: Self-pay | Admitting: Medical Oncology

## 2018-12-07 ENCOUNTER — Other Ambulatory Visit: Payer: Self-pay

## 2018-12-07 ENCOUNTER — Inpatient Hospital Stay: Payer: Medicare HMO

## 2018-12-07 ENCOUNTER — Emergency Department: Payer: Medicare HMO

## 2018-12-07 ENCOUNTER — Inpatient Hospital Stay
Admission: EM | Admit: 2018-12-07 | Discharge: 2018-12-10 | DRG: 682 | Disposition: A | Discharge status: Home Health | Payer: Medicare HMO | Attending: Internal Medicine | Admitting: Internal Medicine

## 2018-12-07 ENCOUNTER — Inpatient Hospital Stay
Admit: 2018-12-07 | Discharge: 2018-12-07 | Disposition: A | Discharge status: Home / Self Care | Payer: Medicare HMO | Attending: Nurse Practitioner | Admitting: Nurse Practitioner

## 2018-12-07 DIAGNOSIS — Z8673 Personal history of transient ischemic attack (TIA), and cerebral infarction without residual deficits: Secondary | ICD-10-CM

## 2018-12-07 DIAGNOSIS — E871 Hypo-osmolality and hyponatremia: Secondary | ICD-10-CM | POA: Diagnosis present

## 2018-12-07 DIAGNOSIS — I6521 Occlusion and stenosis of right carotid artery: Secondary | ICD-10-CM | POA: Diagnosis present

## 2018-12-07 DIAGNOSIS — H3530 Unspecified macular degeneration: Secondary | ICD-10-CM | POA: Diagnosis present

## 2018-12-07 DIAGNOSIS — H9190 Unspecified hearing loss, unspecified ear: Secondary | ICD-10-CM | POA: Diagnosis present

## 2018-12-07 DIAGNOSIS — Z881 Allergy status to other antibiotic agents status: Secondary | ICD-10-CM

## 2018-12-07 DIAGNOSIS — N184 Chronic kidney disease, stage 4 (severe): Secondary | ICD-10-CM | POA: Diagnosis present

## 2018-12-07 DIAGNOSIS — E1122 Type 2 diabetes mellitus with diabetic chronic kidney disease: Secondary | ICD-10-CM | POA: Diagnosis present

## 2018-12-07 DIAGNOSIS — E872 Acidosis: Secondary | ICD-10-CM | POA: Diagnosis present

## 2018-12-07 DIAGNOSIS — R4182 Altered mental status, unspecified: Secondary | ICD-10-CM | POA: Diagnosis present

## 2018-12-07 DIAGNOSIS — Z951 Presence of aortocoronary bypass graft: Secondary | ICD-10-CM

## 2018-12-07 DIAGNOSIS — R410 Disorientation, unspecified: Secondary | ICD-10-CM

## 2018-12-07 DIAGNOSIS — Z8719 Personal history of other diseases of the digestive system: Secondary | ICD-10-CM

## 2018-12-07 DIAGNOSIS — I701 Atherosclerosis of renal artery: Secondary | ICD-10-CM | POA: Diagnosis present

## 2018-12-07 DIAGNOSIS — R0602 Shortness of breath: Secondary | ICD-10-CM | POA: Diagnosis present

## 2018-12-07 DIAGNOSIS — R81 Glycosuria: Secondary | ICD-10-CM | POA: Diagnosis present

## 2018-12-07 DIAGNOSIS — N179 Acute kidney failure, unspecified: Secondary | ICD-10-CM | POA: Diagnosis not present

## 2018-12-07 DIAGNOSIS — G9341 Metabolic encephalopathy: Secondary | ICD-10-CM | POA: Diagnosis present

## 2018-12-07 DIAGNOSIS — Z8711 Personal history of peptic ulcer disease: Secondary | ICD-10-CM

## 2018-12-07 DIAGNOSIS — Z20828 Contact with and (suspected) exposure to other viral communicable diseases: Secondary | ICD-10-CM | POA: Diagnosis present

## 2018-12-07 DIAGNOSIS — M109 Gout, unspecified: Secondary | ICD-10-CM | POA: Diagnosis present

## 2018-12-07 DIAGNOSIS — I2510 Atherosclerotic heart disease of native coronary artery without angina pectoris: Secondary | ICD-10-CM | POA: Diagnosis present

## 2018-12-07 DIAGNOSIS — Z8249 Family history of ischemic heart disease and other diseases of the circulatory system: Secondary | ICD-10-CM

## 2018-12-07 DIAGNOSIS — Z7982 Long term (current) use of aspirin: Secondary | ICD-10-CM

## 2018-12-07 DIAGNOSIS — N271 Small kidney, bilateral: Secondary | ICD-10-CM | POA: Diagnosis present

## 2018-12-07 DIAGNOSIS — R2681 Unsteadiness on feet: Secondary | ICD-10-CM | POA: Diagnosis present

## 2018-12-07 DIAGNOSIS — R05 Cough: Secondary | ICD-10-CM | POA: Diagnosis present

## 2018-12-07 DIAGNOSIS — I714 Abdominal aortic aneurysm, without rupture: Secondary | ICD-10-CM | POA: Diagnosis present

## 2018-12-07 DIAGNOSIS — E785 Hyperlipidemia, unspecified: Secondary | ICD-10-CM | POA: Diagnosis present

## 2018-12-07 DIAGNOSIS — K219 Gastro-esophageal reflux disease without esophagitis: Secondary | ICD-10-CM | POA: Diagnosis present

## 2018-12-07 DIAGNOSIS — I5032 Chronic diastolic (congestive) heart failure: Secondary | ICD-10-CM | POA: Diagnosis present

## 2018-12-07 DIAGNOSIS — Z87891 Personal history of nicotine dependence: Secondary | ICD-10-CM

## 2018-12-07 DIAGNOSIS — E538 Deficiency of other specified B group vitamins: Secondary | ICD-10-CM | POA: Diagnosis present

## 2018-12-07 DIAGNOSIS — R55 Syncope and collapse: Secondary | ICD-10-CM

## 2018-12-07 DIAGNOSIS — Z9049 Acquired absence of other specified parts of digestive tract: Secondary | ICD-10-CM

## 2018-12-07 DIAGNOSIS — Z7984 Long term (current) use of oral hypoglycemic drugs: Secondary | ICD-10-CM

## 2018-12-07 DIAGNOSIS — I130 Hypertensive heart and chronic kidney disease with heart failure and stage 1 through stage 4 chronic kidney disease, or unspecified chronic kidney disease: Secondary | ICD-10-CM | POA: Diagnosis present

## 2018-12-07 DIAGNOSIS — Z79899 Other long term (current) drug therapy: Secondary | ICD-10-CM

## 2018-12-07 DIAGNOSIS — E875 Hyperkalemia: Secondary | ICD-10-CM | POA: Diagnosis present

## 2018-12-07 LAB — CBC
HCT: 43.4 % (ref 39.0–52.0)
Hemoglobin: 13.8 g/dL (ref 13.0–17.0)
MCH: 28.6 pg (ref 26.0–34.0)
MCHC: 31.8 g/dL (ref 30.0–36.0)
MCV: 89.9 fL (ref 80.0–100.0)
Platelets: 218 10*3/uL (ref 150–400)
RBC: 4.83 MIL/uL (ref 4.22–5.81)
RDW: 13.9 % (ref 11.5–15.5)
WBC: 11.6 10*3/uL — ABNORMAL HIGH (ref 4.0–10.5)
nRBC: 0 % (ref 0.0–0.2)

## 2018-12-07 LAB — URINALYSIS, COMPLETE (UACMP) WITH MICROSCOPIC
Bacteria, UA: NONE SEEN
Bilirubin Urine: NEGATIVE
Glucose, UA: 50 mg/dL — AB
Hgb urine dipstick: NEGATIVE
Ketones, ur: NEGATIVE mg/dL
Leukocytes,Ua: NEGATIVE
Nitrite: NEGATIVE
Protein, ur: NEGATIVE mg/dL
Specific Gravity, Urine: 1.008 (ref 1.005–1.030)
pH: 5 (ref 5.0–8.0)

## 2018-12-07 LAB — COMPREHENSIVE METABOLIC PANEL
ALT: 17 U/L (ref 0–44)
ALT: 16 U/L (ref 0–44)
AST: 14 U/L — ABNORMAL LOW (ref 15–41)
AST: 14 U/L — ABNORMAL LOW (ref 15–41)
Albumin: 4.2 g/dL (ref 3.5–5.0)
Albumin: 3.7 g/dL (ref 3.5–5.0)
Alkaline Phosphatase: 46 U/L (ref 38–126)
Alkaline Phosphatase: 41 U/L (ref 38–126)
Anion gap: 7 (ref 5–15)
Anion gap: 6 (ref 5–15)
BUN: 48 mg/dL — ABNORMAL HIGH (ref 8–23)
BUN: 48 mg/dL — ABNORMAL HIGH (ref 8–23)
CO2: 17 mmol/L — ABNORMAL LOW (ref 22–32)
CO2: 17 mmol/L — ABNORMAL LOW (ref 22–32)
Calcium: 9.2 mg/dL (ref 8.9–10.3)
Calcium: 8.7 mg/dL — ABNORMAL LOW (ref 8.9–10.3)
Chloride: 110 mmol/L (ref 98–111)
Chloride: 111 mmol/L (ref 98–111)
Creatinine, Ser: 3.14 mg/dL — ABNORMAL HIGH (ref 0.61–1.24)
Creatinine, Ser: 3.14 mg/dL — ABNORMAL HIGH (ref 0.61–1.24)
GFR calc Af Amer: 21 mL/min — ABNORMAL LOW (ref >60)
GFR calc Af Amer: 21 mL/min — ABNORMAL LOW (ref >60)
GFR calc non Af Amer: 18 mL/min — ABNORMAL LOW (ref >60)
GFR calc non Af Amer: 18 mL/min — ABNORMAL LOW (ref >60)
Glucose, Bld: 163 mg/dL — ABNORMAL HIGH (ref 70–99)
Glucose, Bld: 145 mg/dL — ABNORMAL HIGH (ref 70–99)
Potassium: 7 mmol/L (ref 3.5–5.1)
Potassium: 6.6 mmol/L (ref 3.5–5.1)
Sodium: 134 mmol/L — ABNORMAL LOW (ref 135–145)
Sodium: 134 mmol/L — ABNORMAL LOW (ref 135–145)
Total Bilirubin: 0.8 mg/dL (ref 0.3–1.2)
Total Bilirubin: 0.7 mg/dL (ref 0.3–1.2)
Total Protein: 7.2 g/dL (ref 6.5–8.1)
Total Protein: 6.5 g/dL (ref 6.5–8.1)

## 2018-12-07 LAB — GLUCOSE, RANDOM: Glucose, Bld: 119 mg/dL — ABNORMAL HIGH (ref 70–99)

## 2018-12-07 LAB — OSMOLALITY, URINE: Osmolality, Ur: 247 mOsm/kg — ABNORMAL LOW (ref 300–900)

## 2018-12-07 LAB — GLUCOSE, CAPILLARY
Glucose-Capillary: 112 mg/dL — ABNORMAL HIGH (ref 70–99)
Glucose-Capillary: 123 mg/dL — ABNORMAL HIGH (ref 70–99)
Glucose-Capillary: 152 mg/dL — ABNORMAL HIGH (ref 70–99)
Glucose-Capillary: 136 mg/dL — ABNORMAL HIGH (ref 70–99)

## 2018-12-07 LAB — HEMOGLOBIN A1C
Hgb A1c MFr Bld: 6.8 % — ABNORMAL HIGH (ref 4.8–5.6)
Mean Plasma Glucose: 148.46 mg/dL

## 2018-12-07 LAB — CREATININE, URINE, RANDOM: Creatinine, Urine: 51 mg/dL

## 2018-12-07 LAB — POTASSIUM
Potassium: 6.2 mmol/L — ABNORMAL HIGH (ref 3.5–5.1)
Potassium: 6.8 mmol/L (ref 3.5–5.1)
Potassium: 6.6 mmol/L (ref 3.5–5.1)

## 2018-12-07 LAB — TROPONIN I (HIGH SENSITIVITY)
Troponin I (High Sensitivity): 25 ng/L — ABNORMAL HIGH (ref <18)
Troponin I (High Sensitivity): 25 ng/L — ABNORMAL HIGH (ref <18)

## 2018-12-07 LAB — TSH: TSH: 1.092 u[IU]/mL (ref 0.350–4.500)

## 2018-12-07 LAB — SODIUM, URINE, RANDOM: Sodium, Ur: 52 mmol/L

## 2018-12-07 LAB — VITAMIN B12: Vitamin B-12: 71 pg/mL — ABNORMAL LOW (ref 180–914)

## 2018-12-07 MED ORDER — CALCIUM GLUCONATE-NACL 1-0.675 GM/50ML-% IV SOLN
1.0000 g | Freq: Once | INTRAVENOUS | Status: AC
  Administered 2018-12-07: 1000 mg via INTRAVENOUS
  Filled 2018-12-07: qty 50

## 2018-12-07 MED ORDER — OCUVITE-LUTEIN PO CAPS
1.0000 | ORAL_CAPSULE | Freq: Every day | ORAL | Status: DC
  Administered 2018-12-08 – 2018-12-10 (×3): 1 via ORAL
  Filled 2018-12-07 (×3): qty 1

## 2018-12-07 MED ORDER — INSULIN ASPART 100 UNIT/ML IV SOLN
5.0000 [IU] | Freq: Once | INTRAVENOUS | Status: AC
  Administered 2018-12-07: 5 [IU] via INTRAVENOUS
  Filled 2018-12-07: qty 0.05

## 2018-12-07 MED ORDER — SODIUM CHLORIDE 0.9 % IV BOLUS
500.0000 mL | Freq: Once | INTRAVENOUS | Status: AC
  Administered 2018-12-07: 500 mL via INTRAVENOUS

## 2018-12-07 MED ORDER — SODIUM BICARBONATE 8.4 % IV SOLN
50.0000 meq | Freq: Once | INTRAVENOUS | Status: AC
  Administered 2018-12-07: 50 meq via INTRAVENOUS
  Filled 2018-12-07: qty 50

## 2018-12-07 MED ORDER — INSULIN ASPART 100 UNIT/ML ~~LOC~~ SOLN: 0.0000 [IU] | Freq: Every day | SUBCUTANEOUS | Status: DC

## 2018-12-07 MED ORDER — INSULIN GLARGINE 100 UNIT/ML ~~LOC~~ SOLN
16.0000 [IU] | Freq: Every day | SUBCUTANEOUS | Status: DC
  Administered 2018-12-07 – 2018-12-09 (×3): 16 [IU] via SUBCUTANEOUS
  Filled 2018-12-07 (×4): qty 0.16

## 2018-12-07 MED ORDER — INSULIN ASPART 100 UNIT/ML ~~LOC~~ SOLN
0.0000 [IU] | Freq: Three times a day (TID) | SUBCUTANEOUS | Status: DC
  Administered 2018-12-09 – 2018-12-10 (×2): 2 [IU] via SUBCUTANEOUS
  Filled 2018-12-07 (×3): qty 1

## 2018-12-07 MED ORDER — ALBUTEROL SULFATE (2.5 MG/3ML) 0.083% IN NEBU
10.0000 mg | INHALATION_SOLUTION | Freq: Once | RESPIRATORY_TRACT | Status: AC
  Administered 2018-12-07: 10 mg via RESPIRATORY_TRACT
  Filled 2018-12-07: qty 12

## 2018-12-07 MED ORDER — METOPROLOL SUCCINATE ER 25 MG PO TB24
25.0000 mg | ORAL_TABLET | Freq: Two times a day (BID) | ORAL | Status: DC
  Administered 2018-12-07 – 2018-12-08 (×2): 25 mg via ORAL
  Filled 2018-12-07 (×2): qty 1

## 2018-12-07 MED ORDER — DEXTROSE 50 % IV SOLN
1.0000 | Freq: Once | INTRAVENOUS | Status: AC
  Administered 2018-12-07: 13:00:00 50 mL via INTRAVENOUS
  Filled 2018-12-07: qty 50

## 2018-12-07 MED ORDER — HEPARIN SODIUM (PORCINE) 5000 UNIT/ML IJ SOLN
5000.0000 [IU] | Freq: Three times a day (TID) | INTRAMUSCULAR | Status: DC
  Administered 2018-12-07 – 2018-12-10 (×9): 5000 [IU] via SUBCUTANEOUS
  Filled 2018-12-07 (×9): qty 1

## 2018-12-07 MED ORDER — SODIUM CHLORIDE 0.9 % IV SOLN: 1.0000 g | Freq: Once | INTRAVENOUS | Status: DC

## 2018-12-07 MED ORDER — ASPIRIN EC 81 MG PO TBEC
81.0000 mg | DELAYED_RELEASE_TABLET | Freq: Every day | ORAL | Status: DC
  Administered 2018-12-08 – 2018-12-10 (×3): 81 mg via ORAL
  Filled 2018-12-07 (×3): qty 1

## 2018-12-07 MED ORDER — SODIUM CHLORIDE 0.9 % IV SOLN
1.0000 g | Freq: Once | INTRAVENOUS | Status: DC
  Filled 2018-12-07: qty 10

## 2018-12-07 MED ORDER — SODIUM BICARBONATE 8.4 % IV SOLN
INTRAVENOUS | Status: DC
  Administered 2018-12-07 – 2018-12-10 (×4): via INTRAVENOUS
  Filled 2018-12-07 (×8): qty 150

## 2018-12-07 MED ORDER — HALOPERIDOL LACTATE 5 MG/ML IJ SOLN
5.0000 mg | Freq: Once | INTRAMUSCULAR | Status: AC
  Administered 2018-12-07: 5 mg via INTRAVENOUS
  Filled 2018-12-07: qty 1

## 2018-12-07 MED ORDER — SIMVASTATIN 20 MG PO TABS
20.0000 mg | ORAL_TABLET | Freq: Every day | ORAL | Status: DC
  Administered 2018-12-08 – 2018-12-09 (×2): 20 mg via ORAL
  Filled 2018-12-07 (×2): qty 1

## 2018-12-07 MED ORDER — PANTOPRAZOLE SODIUM 40 MG PO TBEC
40.0000 mg | DELAYED_RELEASE_TABLET | Freq: Every day | ORAL | Status: DC
  Administered 2018-12-08 – 2018-12-10 (×3): 40 mg via ORAL
  Filled 2018-12-07 (×4): qty 1

## 2018-12-07 MED ORDER — DEXTROSE 50 % IV SOLN
1.0000 | Freq: Once | INTRAVENOUS | Status: AC
  Administered 2018-12-07: 50 mL via INTRAVENOUS
  Filled 2018-12-07: qty 50

## 2018-12-07 NOTE — Progress Notes (Addendum)
CRITICAL VALUE ALERT  Critical Value:  K of 6.8  Date & Time Notied:  12/07/18 at 64   Provider Notified: Jonny Ruiz, NP  Orders Received/Actions taken: insulin and dextrose IV

## 2018-12-07 NOTE — Progress Notes (Signed)
Pt impulsive ripped IV tubing in half refused to stay in the bed insisted on coming in the hallway despite trying to redirect pt back to back blood dripping out of IV all the way out in the hallway. Security called to help redirected pt back to room. Finally get pt back to room, hospitalist paged, angela seals, NP gave orders for one time dose of  Haldol. Wife called, to see if there was a family member that could come up and stay with pt to keep pt calm. Pt's wife got in touch with the step son and he is willing to come up and stay with pt and try to keep him calm.

## 2018-12-07 NOTE — ED Notes (Signed)
Pt returned from CT via stretcher. Wife at bedside.

## 2018-12-07 NOTE — ED Notes (Signed)
ED TO INPATIENT HANDOFF REPORT  ED Nurse Name and Phone #: Olive Bass [phone]  S Name/Age/Gender Robert Conrad 78 y.o. male Room/Bed: ED14A/ED14A  Code Status   Code Status: Full Code  Home/SNF/Other Home Patient oriented to: self, place, time and situation Is this baseline? Yes   Triage Complete: Triage complete  Chief Complaint altered mental status   Triage Note Pt here with wife from home with reports that pt began last night to have altered mental status, states that he was walking around saying that he was seeing a "little girl in the bushes". Per wife pt has also been off balance. Pt in NAD, denies pain. Wife states that when she got up last night she noticed the patient on the floor but unsure if he fell. Pt is extremely HOH and is a poor historian.    Allergies Allergies  Allergen Reactions  . Ciprofloxacin Hcl Rash    Level of Care/Admitting Diagnosis ED Disposition    ED Disposition Condition [facility] Area: [place] [100120]  Level of Care: Telemetry [5]  Covid Evaluation: Person Under Investigation (PUI)  Diagnosis: Altered mental status [780.97.ICD-9-CM]  Admitting Physician: Rufina Falco Eugenio Saenz  Attending Physician: Rufina Falco ACHIENG [[id number]]  Estimated length of stay: past midnight tomorrow  Certification:: I certify this patient will need inpatient services for at least 2 midnights  PT Class (Do Not Modify): Inpatient [101]  PT Acc Code (Do Not Modify): Private [1]       B Medical/Surgery History Past Medical History:  Diagnosis Date  . 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 Arthritis   . Bowel trouble 1999  . Diabetes mellitus without complication ([place]) 1191   non insulin dependent  . Diverticulitis   . Eye problems 2010  . GERD (gastroesophageal reflux disease) 2009  . Gout 2009  . Hearing loss   . Heart disease 1990  . Hyperlipidemia 2009  . Hypertension 2009   . Obesity, unspecified   . Personal history of colonic polyps   . Personal history of tobacco use, presenting hazards to health   . Renal insufficiency   . Special screening for malignant neoplasms, colon   . Stroke (cerebrum) ([place])   . Stroke [facility]) 2009  . Ulcer 2009   stomach   Past Surgical History:  Procedure Laterality Date  . CARDIAC CATHETERIZATION  09-30-13  . COLONOSCOPY  2009  . COLONOSCOPY  03-07-11   Dr Jamal Collin  . COLONOSCOPY  03/07/2011  . COLONOSCOPY WITH PROPOFOL N/A 07/16/2016   Procedure: COLONOSCOPY WITH PROPOFOL;  Surgeon: Christene Lye, MD;  Location: [facility] ENDOSCOPY;  Service: Endoscopy;  Laterality: N/A;  . 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 CORONARY ARTERY BYPASS GRAFT    . EYE SURGERY  2009  . HERNIA REPAIR  2010   ventral and umbilical   . STOMACH SURGERY     sigmoid colon resection     A IV Location/Drains/Wounds Patient Lines/Drains/Airways Status   Active Line/Drains/Airways    Name:   Placement date:   Placement time:   Site:   Days:   Peripheral IV 12/07/18 Right Forearm   12/07/18    1024    Forearm   less than 1   Airway   -    -               Intake/Output Last 24 hours  Intake/Output Summary (Last 24 hours) at 12/07/2018 1417  Last data filed at 12/07/2018 1417 Gross per 24 hour  Intake 44.01 ml  Output -  Net 44.01 ml    Labs/Imaging Results for orders placed or performed during the hospital encounter of 12/07/18 (from the past 48 hour(s))  Comprehensive metabolic panel     Status: Abnormal   Collection Time: 12/07/18 10:21 AM  Result Value Ref Range   Sodium 134 (L) 135 - 145 mmol/L   Potassium 7.0 (HH) 3.5 - 5.1 mmol/L    Comment: CRITICAL RESULT CALLED TO, READ BACK BY AND VERIFIED WITH Delora Gravatt AT 1231 ON 12/07/2018 [place].    Chloride 110 98 - 111 mmol/L   CO2 17 (L) 22 - 32 mmol/L   Glucose, Bld 163 (H) 70 - 99 mg/dL   BUN 48 (H) 8 - 23 mg/dL   Creatinine, Ser 3.14 (H) 0.61 - 1.24 mg/dL   Calcium 9.2  8.9 - 10.3 mg/dL   Total Protein 7.2 6.5 - 8.1 g/dL   Albumin 4.2 3.5 - 5.0 g/dL   AST 14 (L) 15 - 41 U/L   ALT 17 0 - 44 U/L   Alkaline Phosphatase 46 38 - 126 U/L   Total Bilirubin 0.8 0.3 - 1.2 mg/dL   GFR calc non Af Amer 18 (L) >60 mL/min   GFR calc Af Amer 21 (L) >60 mL/min   Anion gap 7 5 - 15    Comment: Performed at [facility], [address] [place], [place] [address]  CBC     Status: Abnormal   Collection Time: 12/07/18 10:21 AM  Result Value Ref Range   WBC 11.6 (H) 4.0 - 10.5 K/uL   RBC 4.83 4.22 - 5.81 MIL/uL   Hemoglobin 13.8 13.0 - 17.0 g/dL   HCT 43.4 39.0 - 52.0 %   MCV 89.9 80.0 - 100.0 fL   MCH 28.6 26.0 - 34.0 pg   MCHC 31.8 30.0 - 36.0 g/dL   RDW 13.9 11.5 - 15.5 %   Platelets 218 150 - 400 K/uL   nRBC 0.0 0.0 - 0.2 %    Comment: Performed at [facility], [address] [place], [place] [address]  Urinalysis, Complete w Microscopic     Status: Abnormal   Collection Time: 12/07/18 11:23 AM  Result Value Ref Range   Color, Urine STRAW (A) YELLOW   APPearance CLEAR (A) CLEAR   Specific Gravity, Urine 1.008 1.005 - 1.030   pH 5.0 5.0 - 8.0   Glucose, UA 50 (A) NEGATIVE mg/dL   Hgb urine dipstick NEGATIVE NEGATIVE   Bilirubin Urine NEGATIVE NEGATIVE   Ketones, ur NEGATIVE NEGATIVE mg/dL   Protein, ur NEGATIVE NEGATIVE mg/dL   Nitrite NEGATIVE NEGATIVE   Leukocytes,Ua NEGATIVE NEGATIVE   RBC / HPF 0-5 0 - 5 RBC/hpf   WBC, UA 0-5 0 - 5 WBC/hpf   Bacteria, UA NONE SEEN NONE SEEN   Squamous Epithelial / LPF 0-5 0 - 5   Mucus PRESENT     Comment: Performed at [facility], [address], [place], [place] [address]  Comprehensive metabolic panel     Status: Abnormal   Collection Time: 12/07/18 12:35 PM  Result Value Ref Range   Sodium 134 (L) 135 - 145 mmol/L   Potassium 6.6 (HH) 3.5 - 5.1 mmol/L    Comment: CRITICAL RESULT CALLED TO, READ BACK BY AND VERIFIED WITH Keonta Alsip 12/07/18 1318 KLW    Chloride 111 98 -  111 mmol/L   CO2  17 (L) 22 - 32 mmol/L   Glucose, Bld 145 (H) 70 - 99 mg/dL   BUN 48 (H) 8 - 23 mg/dL   Creatinine, Ser 3.14 (H) 0.61 - 1.24 mg/dL   Calcium 8.7 (L) 8.9 - 10.3 mg/dL   Total Protein 6.5 6.5 - 8.1 g/dL   Albumin 3.7 3.5 - 5.0 g/dL   AST 14 (L) 15 - 41 U/L   ALT 16 0 - 44 U/L   Alkaline Phosphatase 41 38 - 126 U/L   Total Bilirubin 0.7 0.3 - 1.2 mg/dL   GFR calc non Af Amer 18 (L) >60 mL/min   GFR calc Af Amer 21 (L) >60 mL/min   Anion gap 6 5 - 15    Comment: Performed at [facility], [place], [place] [address]  Troponin I (High Sensitivity)     Status: Abnormal   Collection Time: 12/07/18  1:14 PM  Result Value Ref Range   Troponin I (High Sensitivity) 25 (H) <18 ng/L    Comment: (NOTE) Elevated high sensitivity troponin I (hsTnI) values and significant  changes across serial measurements may suggest ACS but many other  chronic and acute conditions are known to elevate hsTnI results.  Refer to the "Links" section for chest pain algorithms and additional  guidance. Performed at [facility], [address] [place], [place] [address]    Ct Head Wo Contrast  Result Date: 12/07/2018 CLINICAL DATA:  Altered mental status. EXAM: CT HEAD WITHOUT CONTRAST TECHNIQUE: Contiguous axial images were obtained from the base of the skull through the vertex without intravenous contrast. COMPARISON:  03/18/2013 FINDINGS: Brain: Again noted is encephalomalacia in the right temporoparietal region compatible with prior insult or infarct. Stable low-density in the right frontal periventricular white matter. Negative for acute hemorrhage, mass lesion, midline shift, hydrocephalus or new large infarct. Vascular: Atherosclerotic calcifications involving the vertebral arteries. Skull: Normal. Negative for fracture or focal lesion. Sinuses/Orbits: No acute finding. Other: None. IMPRESSION: 1. No acute intracranial abnormality. 2. 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 Electronically Signed   By: Markus Daft M.D.   On: 12/07/2018 11:08    Pending Labs Unresulted Labs (From admission, onward)    Start     Ordered   12/08/18 7412  Basic metabolic panel  Tomorrow morning,   STAT     12/07/18 1332   12/08/18 0500  CBC  Tomorrow morning,   STAT     12/07/18 1332   12/07/18 1417  Hemoglobin A1c  Add-on,   AD     12/07/18 1417   12/07/18 1402  Glucose, random  (Severe hyperkalemia - Potassium  > 7 mEq/L with or without ECG changes)  Once,   STAT    Comments: 1 hour after insulin administered.    12/07/18 1246   12/07/18 1330  Sodium, urine, random  Add-on,   AD     12/07/18 1332   12/07/18 1330  Osmolality, urine  Add-on,   AD     12/07/18 1332   12/07/18 1330  Creatinine, urine, random  Add-on,   AD     12/07/18 1332   12/07/18 1326  Potassium  Now then every 4 hours,   STAT    Comments: Until normal twice.    12/07/18 1332          Vitals/Pain Today's Vitals   12/07/18 1102 12/07/18 1130 12/07/18 1231 12/07/18 1333  BP: 137/60 (!) 119/59 115/83 (!) 145/81  Pulse: 88  81 81 (!) 104  Resp: (!) 24 (!) 25 16 (!) 24  Temp:      TempSrc:      SpO2: 100% 98% 98% 96%  Weight:      PainSc:        Isolation Precautions No active isolations  Medications Medications  aspirin EC tablet 81 mg (has no administration in time range)  metoprolol succinate (TOPROL-XL) 24 hr tablet 25 mg (has no administration in time range)  simvastatin (ZOCOR) tablet 20 mg (has no administration in time range)  pantoprazole (PROTONIX) EC tablet 40 mg (has no administration in time range)  PreserVision AREDS 2 CAPS 1 capsule (has no administration in time range)  heparin injection 5,000 Units (has no administration in time range)  insulin aspart (novoLOG) injection 0-9 Units (has no administration in time range)  insulin glargine (LANTUS) injection 16 Units (has no administration in time range)  insulin aspart (novoLOG) injection 0-5 Units  (has no administration in time range)  calcium gluconate 1 g/ 50 mL sodium chloride IVPB ( Intravenous Stopped 12/07/18 1406)  insulin aspart (novoLOG) injection 5 Units (5 Units Intravenous Given 12/07/18 1312)    And  dextrose 50 % solution 50 mL (50 mLs Intravenous Given 12/07/18 1312)  sodium bicarbonate injection 50 mEq (50 mEq Intravenous Given 12/07/18 1312)  sodium chloride 0.9 % bolus 500 mL (500 mLs Intravenous New Bag/Given 12/07/18 1305)    Mobility walks High fall risk   Focused Assessments Renal Assessment Handoff:  Hemodialysis Schedule: Not currently on dialysis - AKI Last Hemodialysis date and time:    Restricted appendage: NA     R Recommendations: See Admitting Provider Note  Report given to:   Additional Notes:  Pt is hard of hearing and while pt answers all orientation questions appropriately pt is having trouble understanding medical treatment to this point. RN and MD have attempted to explain multiple times but it is best to also explain treatment to pts wife.

## 2018-12-07 NOTE — ED Triage Notes (Addendum)
Pt here with wife from home with reports that pt began last night to have altered mental status, states that he was walking around saying that he was seeing a "little girl in the bushes". Per wife pt has also been off balance. Pt in NAD, denies pain. Wife states that when she got up last night she noticed the patient on the floor but unsure if he fell. Pt is extremely HOH and is a poor historian.

## 2018-12-07 NOTE — ED Provider Notes (Addendum)
[facility] Emergency Department Provider Note  ____________________________________________   First MD Initiated Contact with Patient 12/07/18 1010     (approximate)  I have reviewed the triage vital signs and the nursing notes.  HISTORY  Chief Complaint Altered Mental Status and Hallucinations    HPI Jash Wahlen Berninger is a 78 y.o. male history of diabetes, GERD, hyperlipidemia, stroke, CAD, who presents with altered mental status, confusion over the last several days.  Wife notes that he has been acting confused and foggy over the last several days, whereas he is typically very sharp and "with it".  She also notes a hallucination yesterday, when the patient thought he saw a young child running in their lawn who was not there.  This has never happened before.  She states he seems a little bit off balance as well.  She reports despite prior recommendations from their renal doctor, over the last several days she thinks he has been taking Aleve more often than normal for "general aches and pains".  History significantly limited, given patient is mildly confused, and wife is a poor historian.        Past Medical Hx Past Medical History:  Diagnosis Date  . 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 Arthritis   . Bowel trouble 1999  . Diabetes mellitus without complication ([employer]) 4174   non insulin dependent  . Diverticulitis   . Eye problems 2010  . GERD (gastroesophageal reflux disease) 2009  . Gout 2009  . Hearing loss   . Heart disease 1990  . Hyperlipidemia 2009  . Hypertension 2009  . Obesity, unspecified   . Personal history of colonic polyps   . Personal history of tobacco use, presenting hazards to health   . Renal insufficiency   . Special screening for malignant neoplasms, colon   . Stroke (cerebrum) ([place])   . Stroke [facility]) 2009  . Ulcer 2009   stomach    Problem List Patient Active Problem List   Diagnosis Date Noted  .  PAD (peripheral artery disease) ([place]) 12/17/2016  . Diabetes ([place]) 12/17/2016  . Essential hypertension 12/17/2016  . Hyperlipidemia 12/17/2016  . Renal artery stenosis ([place]) 10/05/2013  . History of colon polyps 02/24/2013  . Abdominal aortic aneurysm ([place]) 02/24/2013    Past Surgical Hx Past Surgical History:  Procedure Laterality Date  . CARDIAC CATHETERIZATION  09-30-13  . COLONOSCOPY  2009  . COLONOSCOPY  03-07-11   Dr Jamal Collin  . COLONOSCOPY  03/07/2011  . COLONOSCOPY WITH PROPOFOL N/A 07/16/2016   Procedure: COLONOSCOPY WITH PROPOFOL;  Surgeon: Christene Lye, MD;  Location: [facility] ENDOSCOPY;  Service: Endoscopy;  Laterality: N/A;  . 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 CORONARY ARTERY BYPASS GRAFT    . EYE SURGERY  2009  . HERNIA REPAIR  2010   ventral and umbilical   . STOMACH SURGERY     sigmoid colon resection    Medications Prior to Admission medications   Medication Sig Start Date End Date Taking? Authorizing Provider  aspirin 81 MG tablet Take 81 mg by mouth daily.    [provider]  benazepril (LOTENSIN) 20 MG tablet Take 20 mg by mouth daily.  01/28/13   [provider]  doxycycline (VIBRA-TABS) 100 MG tablet Take 1 tablet (100 mg total) by mouth 2 (two) times daily. 05/30/16   Edrick Kins, DPM  glimepiride (AMARYL) 4 MG tablet Take 4 mg by mouth daily before breakfast.  02/16/13   [provider]  hydroxypropyl methylcellulose (ISOPTO TEARS) 2.5 % ophthalmic solution 1 drop.    [provider]  metFORMIN (GLUCOPHAGE-XR) 500 MG 24 hr tablet Take 1,000 mg by mouth daily with breakfast.  01/31/13   [provider]  metoprolol succinate (TOPROL-XL) 25 MG 24 hr tablet Take 25 mg by mouth daily.  02/05/13   [provider]  naproxen (NAPROSYN) 500 MG tablet Take 500 mg by mouth daily.  02/10/13   [provider]  omeprazole (PRILOSEC) 20 MG capsule Take 20 mg by mouth daily.  02/02/13   [provider]  ONE TOUCH ULTRA TEST test strip  12/13/12   [provider]  ONETOUCH DELICA LANCETS 24M MISC  12/13/12   [provider]  polyethylene glycol powder (GLYCOLAX/MIRALAX) powder 255 grams one bottle for colonoscopy prep 06/11/16   Christene Lye, MD  simvastatin (ZOCOR) 20 MG tablet Take 20 mg by mouth daily.  02/16/13   [provider]  TOUJEO SOLOSTAR 300 UNIT/ML [facility]  04/30/16   [provider]  traMADol (ULTRAM) 50 MG tablet Take by mouth.    [provider]    Allergies Ciprofloxacin hcl  Family Hx Family History  Problem Relation Age of Onset  . Cancer Mother   . Heart attack Father     Social Hx Social History   Tobacco Use  . Smoking status: Never Smoker  . Smokeless tobacco: Never Used  . Tobacco comment: quit in 1990  Substance Use Topics  . Alcohol use: No  . Drug use: No    Review of Systems  Constitutional: No fever. No chills. Eyes: No visual changes. ENT: No sore throat. Cardiovascular: No chest pain. Respiratory: No shortness of breath. Gastrointestinal: No abdominal pain. No nausea. No vomiting. Genitourinary: No dysuria. Musculoskeletal: No back pain. Skin: No rash. Neurological: + Generalized confusion  ____________________________________________   PHYSICAL EXAM:  Vital Signs: ED Triage Vitals  Enc Vitals Group     BP 12/07/18 1003 (!) 144/50     Pulse Rate 12/07/18 1003 79     Resp 12/07/18 1003 16     Temp 12/07/18 1003 97.7 F (36.5 C)     Temp Source 12/07/18 1003 Oral     SpO2 12/07/18 1003 99 %     Weight 12/07/18 1006 189 lb 9.5 oz (86 kg)     Height --      Head Circumference --      Peak Flow --      Pain Score 12/07/18 1006 0     Pain Loc --      Pain Edu? --      Excl. in [place]? --     Constitutional: Alert and oriented, but sometimes trails off when answer questions, or doesn't answer questions clearly.  Eyes: Conjunctivae are normal. Sclera anicteric.  Head: Normocephalic. Atraumatic. Nose: No congestion. No rhinorrhea. Mouth/Throat: Mucous membranes are slightly dry.  Neck: No stridor.   Cardiovascular: Normal rate, regular rhythm. Occasional PVCs on monitor.  Respiratory: Normal respiratory effort.  No retractions. Lungs CTAB. Gastrointestinal: Soft and non-tender. No distention.  Guaiac negative. Musculoskeletal: No lower extremity edema. Neurologic:  Normal speech and language. No gross focal neurologic deficits are appreciated. Face symmetric.  Tongue midline.  Cranial nerves II through XII intact. UE and LE strength 5/5 and symmetric. UE and LE SILT. Mild resting tremor.  Skin: Skin is warm, dry and intact. No rash noted. Psychiatric: Mood and affect are  appropriate for situation.  ____________________________________________  EKG  Personally reviewed. Sinus rhythm. Prolonged PR. RBBB. Slightly peaked T waves in lateral precordial leads.   ____________________________________________  RADIOLOGY  I have personally reviewed CTH. No acute bleed.    ____________________________________________   PROCEDURES  Procedure(s) performed (including Critical Care):  .Critical Care Performed by: Lilia Pro., MD Authorized by: Lilia Pro., MD   Critical care provider statement:    Critical care time (minutes):  30   Critical care time was exclusive of:  Separately billable procedures and treating other patients   Critical care was necessary to treat or prevent imminent or life-threatening deterioration of the following conditions:  Renal failure   Critical care was time spent personally by me on the following activities:  Discussions with consultants, evaluation of patient's response to treatment, examination of patient, ordering and performing treatments and interventions, ordering and review of laboratory studies, ordering and review of radiographic studies, pulse oximetry, re-evaluation of patient's condition, obtaining  history from patient or surrogate and review of old charts     ____________________________________________   INITIAL IMPRESSION / ASSESSMENT AND PLAN / ED COURSE  78 y.o. male history of diabetes, GERD, hyperlipidemia, stroke, CAD, who presents with vague altered mental status, confusion over the last several days.   On exam he is alert and oriented.  Hard of hearing.  Able to answer questions, but occasionally trails off and provides answers that do not make sense.  No focal neurological deficits.  Differential includes infection, electrolyte abnormalities, intracranial etiologies, amongst others.  Plan for labs, head imaging.  Work-up reveals new, severe acute renal injury.  Creatinine is 3.14 from 1.62 from prior. BUN elevated, likely contributing to confusion.  Potassium is 7.  Given the T waves in the lateral precordial leads and intermittent PVCs, will give calcium.  Will treat with insulin, dextrose, bicarb, fluids.  Will hold on albuterol for now given his history of cardiac disease.  Will admit.   ____________________________________________   FINAL CLINICAL IMPRESSION(S) / ED DIAGNOSES  Final diagnoses:  Hyperkalemia  AKI (acute kidney injury) ([place])  Confusion    Note:  This document was prepared using Dragon voice recognition software and may include unintentional dictation errors.     Lilia Pro., MD 12/07/18 [place]

## 2018-12-07 NOTE — Consult Note (Signed)
[facility]  CONSULT NOTE    Date: 12/07/2018                  Patient Name:  Robert Conrad  MRN: [id number]  DOB: 1941-01-16  Age / Sex: 78 y.o., male         PCP: Albina Billet, MD                 Service Requesting Consult: Dr. Fritzi Mandes                 Reason for Consult: Acute renal failure with hyperkalemia            History of Present Illness: Robert Conrad presents with altered mental status. Patient's wife gives history. Patient was confused with hallucinations. Brought to the [facility] ED where patient was found to be in acute renal failure, unknown baseline creatinine, with hyperkalemia. Treated with IV calcium gluconate. Patient with hypertension.   Patient last saw [facility] on 11/2017. PCP does patient's blood work. Current outpatient labs are not available.     Medications: Outpatient medications: (Not in a hospital admission)   Current medications: Current Facility-Administered Medications  Medication Dose Route Frequency Provider Last Rate Last Dose  . [START ON 12/08/2018] aspirin EC tablet 81 mg  81 mg Oral Daily Ouma, Bing Neighbors, NP      . heparin injection 5,000 Units  5,000 Units Subcutaneous Q8H Ouma, Bing Neighbors, NP      . insulin aspart (novoLOG) injection 0-5 Units  0-5 Units Subcutaneous QHS Ouma, Bing Neighbors, NP      . insulin aspart (novoLOG) injection 0-9 Units  0-9 Units Subcutaneous TID WC Ouma, Bing Neighbors, NP      . insulin glargine (LANTUS) injection 16 Units  16 Units Subcutaneous QHS Lang Snow, NP      . metoprolol succinate (TOPROL-XL) 24 hr tablet 25 mg  25 mg Oral BID Lang Snow, NP      . Derrill Memo ON 12/08/2018] pantoprazole (PROTONIX) EC tablet 40 mg  40 mg Oral Daily Lang Snow, NP      . PreserVision AREDS 2 CAPS 1 capsule  1 capsule Oral Daily Ouma, Bing Neighbors, NP      . Derrill Memo ON 12/08/2018] simvastatin (ZOCOR) tablet 20 mg  20 mg  Oral Daily Ouma, Bing Neighbors, NP       Current Outpatient Medications  Medication Sig Dispense Refill  . aspirin 81 MG tablet Take 81 mg by mouth daily.    . benazepril (LOTENSIN) 20 MG tablet Take 20 mg by mouth daily.     . metFORMIN (GLUCOPHAGE-XR) 500 MG 24 hr tablet Take 1,000 mg by mouth daily with breakfast.     . metoprolol succinate (TOPROL-XL) 25 MG 24 hr tablet Take 25 mg by mouth 2 (two) times daily.     . Multiple Vitamins-Minerals (PRESERVISION AREDS 2) CAPS Take 1 capsule by mouth daily.    Marland Kitchen omeprazole (PRILOSEC) 20 MG capsule Take 20 mg by mouth daily.     . simvastatin (ZOCOR) 20 MG tablet Take 20 mg by mouth daily.     Nelva Nay SOLOSTAR 300 UNIT/ML SOPN Inject 33 Units into the skin daily.         Allergies: Allergies  Allergen Reactions  . Ciprofloxacin Hcl Rash      Past Medical History: Past Medical History:  Diagnosis Date  . 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  Arthritis   . Bowel trouble 1999  . Diabetes mellitus without complication ([place]) 8502   non insulin dependent  . Diverticulitis   . Eye problems 2010  . GERD (gastroesophageal reflux disease) 2009  . Gout 2009  . Hearing loss   . Heart disease 1990  . Hyperlipidemia 2009  . Hypertension 2009  . Obesity, unspecified   . Personal history of colonic polyps   . Personal history of tobacco use, presenting hazards to health   . Renal insufficiency   . Special screening for malignant neoplasms, colon   . Stroke (cerebrum) ([place])   . Stroke [facility]) 2009  . Ulcer 2009   stomach     Past Surgical History: Past Surgical History:  Procedure Laterality Date  . CARDIAC CATHETERIZATION  09-30-13  . COLONOSCOPY  2009  . COLONOSCOPY  03-07-11   Dr Jamal Collin  . COLONOSCOPY  03/07/2011  . COLONOSCOPY WITH PROPOFOL N/A 07/16/2016   Procedure: COLONOSCOPY WITH PROPOFOL;  Surgeon: Christene Lye, MD;  Location: [facility] ENDOSCOPY;  Service: Endoscopy;  Laterality: N/A;  . CORONARY  ARTERY BYPASS GRAFT  May 1990  . CORONARY ARTERY BYPASS GRAFT    . EYE SURGERY  2009  . HERNIA REPAIR  2010   ventral and umbilical   . STOMACH SURGERY     sigmoid colon resection     Family History: Family History  Problem Relation Age of Onset  . Cancer Mother   . Heart attack Father      Social History: Social History   Socioeconomic History  . Marital status: Married    Spouse name: Not on file  . Number of children: Not on file  . Years of education: Not on file  . Highest education level: Not on file  Occupational History  . Not on file  Social Needs  . Financial resource strain: Not on file  . Food insecurity    Worry: Not on file    Inability: Not on file  . Transportation needs    Medical: Not on file    Non-medical: Not on file  Tobacco Use  . Smoking status: Never Smoker  . Smokeless tobacco: Never Used  . Tobacco comment: quit in 1990  Substance and Sexual Activity  . Alcohol use: No  . Drug use: No  . Sexual activity: Not on file  Lifestyle  . Physical activity    Days per week: Not on file    Minutes per session: Not on file  . Stress: Not on file  Relationships  . Social Herbalist on phone: Not on file    Gets together: Not on file    Attends religious service: Not on file    Active member of club or organization: Not on file    Attends meetings of clubs or organizations: Not on file    Relationship status: Not on file  . Intimate partner violence    Fear of current or ex partner: Not on file    Emotionally abused: Not on file    Physically abused: Not on file    Forced sexual activity: Not on file  Other Topics Concern  . Not on file  Social History Narrative  . Not on file     Review of Systems: Review of Systems  Unable to perform ROS: Mental status change    Vital Signs: Blood pressure (!) 145/81, pulse (!) 104, temperature 97.7 F (36.5 C), temperature source Oral, resp. rate (!) 24, weight  86 kg, SpO2 96  %.  Weight trends: Filed Weights   12/07/18 1006  Weight: 86 kg    Physical Exam: General: NAD, sitting up in stretcher  Head: Normocephalic, atraumatic. Moist oral mucosal membranes  Eyes: Anicteric, PERRL  Neck: Supple, trachea midline  Lungs:  Clear to auscultation  Heart: Regular rate and rhythm  Abdomen:  Soft, nontender,   Extremities:  no peripheral edema.  Neurologic: Nonfocal, moving all four extremities  Skin: No lesions  Access: none     Lab results: Basic Metabolic Panel: Recent Labs  Lab 12/07/18 1021 12/07/18 1235 12/07/18 1408  NA 134* 134*  --   K 7.0* 6.6*  --   CL 110 111  --   CO2 17* 17*  --   GLUCOSE 163* 145* 119*  BUN 48* 48*  --   CREATININE 3.14* 3.14*  --   CALCIUM 9.2 8.7*  --     Liver Function Tests: Recent Labs  Lab 12/07/18 1021 12/07/18 1235  AST 14* 14*  ALT 17 16  ALKPHOS 46 41  BILITOT 0.8 0.7  PROT 7.2 6.5  ALBUMIN 4.2 3.7   No results for input(s): LIPASE, AMYLASE in the last 168 hours. No results for input(s): AMMONIA in the last 168 hours.  CBC: Recent Labs  Lab 12/07/18 1021  WBC 11.6*  HGB 13.8  HCT 43.4  MCV 89.9  PLT 218    Cardiac Enzymes: No results for input(s): CKTOTAL, CKMB, CKMBINDEX, TROPONINI in the last 168 hours.  BNP: Invalid input(s): POCBNP  CBG: No results for input(s): GLUCAP in the last 168 hours.  Microbiology: No results found for this or any previous visit.  Coagulation Studies: No results for input(s): LABPROT, INR in the last 72 hours.  Urinalysis: Recent Labs    12/07/18 1123  COLORURINE STRAW*  LABSPEC 1.008  PHURINE 5.0  GLUCOSEU 50*  HGBUR NEGATIVE  BILIRUBINUR NEGATIVE  KETONESUR NEGATIVE  PROTEINUR NEGATIVE  NITRITE NEGATIVE  LEUKOCYTESUR NEGATIVE      Imaging: Ct Head Wo Contrast  Result Date: 12/07/2018 CLINICAL DATA:  Altered mental status. EXAM: CT HEAD WITHOUT CONTRAST TECHNIQUE: Contiguous axial images were obtained from the base of the skull  through the vertex without intravenous contrast. COMPARISON:  03/18/2013 FINDINGS: Brain: Again noted is encephalomalacia in the right temporoparietal region compatible with prior insult or infarct. Stable low-density in the right frontal periventricular white matter. Negative for acute hemorrhage, mass lesion, midline shift, hydrocephalus or new large infarct. Vascular: Atherosclerotic calcifications involving the vertebral arteries. Skull: Normal. Negative for fracture or focal lesion. Sinuses/Orbits: No acute finding. Other: None. IMPRESSION: 1. No acute intracranial abnormality. 2. 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 Electronically Signed   By: Markus Daft M.D.   On: 12/07/2018 11:08      Assessment & Plan: Robert Conrad is a 78 y.o. white male with chronic kidney disease stage IV followed by [facility], hypertension, diabetes mellitus type II, congestive heart failure, coronary artery disease status post CABG, hyperlipidemia, AAA, peripheral vascular disease, renal artery disease, gout, peptic ulcer disease/GERD, macular degeneration , who was admitted to [facility] on 12/07/2018 for altered mental status   1. Acute renal failure with hyperkalemia, hyponatremia and metabolic acidosis on chronic kidney disease stage III with proteinuria: unknown baseline creatinine. Follows with [facility], last seen in 11/2017. Chronic kidney disease secondary to diabetic nephropathy.  Acute renal failure source is unclear. Concern for obstructive uropathy, progression of chronic kidney disease or prerenal  azotemia.  Encephalopathy can be explained by uremia.  Urinalysis with glucosuria.  - holding benazepril - Start sodium bicarbonate infusion - Check renal ultrasound - Check echocardiogram  2. Hypertension: blood pressure at goal.   3. Diabetes mellitus type II with chronic kidney disease: noninsulin dependent - Check hemoglobin A1c  LOS: 0 Ayala Ribble 8/4/20202:43 PM

## 2018-12-07 NOTE — H&P (Addendum)
[place] [facility] [place] NAME: Robert Conrad    MR#:  [id number]  DATE OF BIRTH:  08-21-1940  DATE OF ADMISSION:  12/07/2018  PRIMARY CARE PHYSICIAN: Albina Billet, MD   REQUESTING/REFERRING PHYSICIAN: Lilia Pro., MD  CHIEF COMPLAINT:   Chief Complaint  Patient presents with  . Altered Mental Status  . Hallucinations    HISTORY OF PRESENT ILLNESS:   78 year old male with past medical history of abdominal aneurysm, diabetes mellitus, GERD, hyperlipidemia, hypertension, right MCA stroke, CKD, right renal artery stenosis and CAD presenting to the ED with altered mental status.  Patient is very hard of hearing so history mostly obtained from patient's wife who is currently at the bedside.  Per patient's wife, patient's been acting confused and hallucinating since yesterday.  She found him on the floor this morning at around 4 AM unsure if he fell or was just lying down.  Patient's wife states that he has been urinating a lot for the past 3 days and unsteady on his feet.  Patient also complains of worsening vision due to history of macular degeneration, right ear fullness and pressure also complained of shortness of breath and nonproductive cough over the last few days.  Denies fevers or chills, nausea or vomiting, chest pain or other associated symptoms.   On arrival to the ED, he was afebrile with blood pressure 144/50 mm Hg and pulse rate 79 beats/min. There were no focal neurological deficits; he was alert and oriented x3 but very hard of hearing.  Initial labs revealed sodium of 134, potassium 7.0, BUN 48, creatinine 3.14, WBC 11.6, UA negative for UTI.  A noncontrast CT head was obtained and showed no acute intracranial abnormality.  Patient received sodium bicarb, IV calcium gluconate, insulin and dextrose for hyperkalemia.  Patient will be admitted under hospitalist service for further management.  PAST MEDICAL HISTORY:   Past Medical  History:  Diagnosis Date  . 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 Arthritis   . Bowel trouble 1999  . Diabetes mellitus without complication ([place]) 3536   non insulin dependent  . Diverticulitis   . Eye problems 2010  . GERD (gastroesophageal reflux disease) 2009  . Gout 2009  . Hearing loss   . Heart disease 1990  . Hyperlipidemia 2009  . Hypertension 2009  . Obesity, unspecified   . Personal history of colonic polyps   . Personal history of tobacco use, presenting hazards to health   . Renal insufficiency   . Special screening for malignant neoplasms, colon   . Stroke (cerebrum) ([place])   . Stroke [facility]) 2009  . Ulcer 2009   stomach    PAST SURGICAL HISTORY:   Past Surgical History:  Procedure Laterality Date  . CARDIAC CATHETERIZATION  09-30-13  . COLONOSCOPY  2009  . COLONOSCOPY  03-07-11   Dr Jamal Collin  . COLONOSCOPY  03/07/2011  . COLONOSCOPY WITH PROPOFOL N/A 07/16/2016   Procedure: COLONOSCOPY WITH PROPOFOL;  Surgeon: Christene Lye, MD;  Location: [facility] ENDOSCOPY;  Service: Endoscopy;  Laterality: N/A;  . 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 CORONARY ARTERY BYPASS GRAFT    . EYE SURGERY  2009  . HERNIA REPAIR  2010   ventral and umbilical   . STOMACH SURGERY     sigmoid colon resection    SOCIAL HISTORY:   Social History   Tobacco Use  . Smoking status: Never Smoker  . Smokeless  tobacco: Never Used  . Tobacco comment: quit in 1990  Substance Use Topics  . Alcohol use: No    FAMILY HISTORY:   Family History  Problem Relation Age of Onset  . Cancer Mother   . Heart attack Father     DRUG ALLERGIES:   Allergies  Allergen Reactions  . Ciprofloxacin Hcl Rash    REVIEW OF SYSTEMS:   ROS Unable to obtain from patient due to difficulty with hearing bilaterally  MEDICATIONS AT HOME:   Prior to Admission medications   Medication Sig Start Date End Date Taking? Authorizing Provider  aspirin 81 MG tablet Take 81 mg by mouth  daily.   Yes [provider]  benazepril (LOTENSIN) 20 MG tablet Take 20 mg by mouth daily.  01/28/13  Yes [provider]  metFORMIN (GLUCOPHAGE-XR) 500 MG 24 hr tablet Take 1,000 mg by mouth daily with breakfast.  01/31/13  Yes [provider]  metoprolol succinate (TOPROL-XL) 25 MG 24 hr tablet Take 25 mg by mouth 2 (two) times daily.  02/05/13  Yes [provider]  Multiple Vitamins-Minerals (PRESERVISION AREDS 2) CAPS Take 1 capsule by mouth daily.   Yes [provider]  omeprazole (PRILOSEC) 20 MG capsule Take 20 mg by mouth daily.  02/02/13  Yes [provider]  simvastatin (ZOCOR) 20 MG tablet Take 20 mg by mouth daily.  02/16/13  Yes [provider]  TOUJEO SOLOSTAR 300 UNIT/ML SOPN Inject 33 Units into the skin daily.  04/30/16  Yes [provider]      VITAL SIGNS:  Blood pressure 115/83, pulse 81, temperature 97.7 F (36.5 C), temperature source Oral, resp. rate 16, weight 86 kg, SpO2 98 %.  PHYSICAL EXAMINATION:   Physical Exam  GENERAL:  78 y.o.-year-old patient lying in the bed with no acute distress.  EYES: Pupils equal, round, reactive to light and accommodation. No scleral icterus. Extraocular muscles intact.  HEENT: Head atraumatic, normocephalic. Oropharynx and nasopharynx clear.  NECK:  Supple, no jugular venous distention. No thyroid enlargement, no tenderness.  LUNGS: Normal breath sounds bilaterally, no wheezing, rales,rhonchi or crepitation. No use of accessory muscles of respiration.  CARDIOVASCULAR: S1, S2 normal. No murmurs, rubs, or gallops.  ABDOMEN: Soft, nontender, nondistended. Bowel sounds present. No organomegaly or mass.  EXTREMITIES: No pedal edema, cyanosis, or clubbing. No rash or lesions. + pedal pulses MUSCULOSKELETAL: Normal bulk, and power was 5+ grip and elbow, knee, and ankle flexion and extension bilaterally.  NEUROLOGIC:Alert and oriented x 3. CN 2-12 intact. Sensation to  light touch and cold stimuli intact . Babinski is downgoing. DTR's (biceps, patellar, and achilles) 2+ and symmetric throughout. Gait not tested due to safety concern. PSYCHIATRIC: The patient is alert and oriented x 3.  SKIN: No obvious rash, lesion, or ulcer.   DATA REVIEWED:  LABORATORY PANEL:   CBC Recent Labs  Lab 12/07/18 1021  WBC 11.6*  HGB 13.8  HCT 43.4  PLT 218   ------------------------------------------------------------------------------------------------------------------  Chemistries  Recent Labs  Lab 12/07/18 1235  NA 134*  K 6.6*  CL 111  CO2 17*  GLUCOSE 145*  BUN 48*  CREATININE 3.14*  CALCIUM 8.7*  AST 14*  ALT 16  ALKPHOS 41  BILITOT 0.7   ------------------------------------------------------------------------------------------------------------------  Cardiac Enzymes No results for input(s): TROPONINI in the last 168 hours. ------------------------------------------------------------------------------------------------------------------  RADIOLOGY:  Ct Head Wo Contrast  Result Date: 12/07/2018 CLINICAL DATA:  Altered mental status. EXAM: CT HEAD WITHOUT CONTRAST TECHNIQUE: Contiguous axial images were  obtained from the base of the skull through the vertex without intravenous contrast. COMPARISON:  03/18/2013 FINDINGS: Brain: Again noted is encephalomalacia in the right temporoparietal region compatible with prior insult or infarct. Stable low-density in the right frontal periventricular white matter. Negative for acute hemorrhage, mass lesion, midline shift, hydrocephalus or new large infarct. Vascular: Atherosclerotic calcifications involving the vertebral arteries. Skull: Normal. Negative for fracture or focal lesion. Sinuses/Orbits: No acute finding. Other: None. IMPRESSION: 1. No acute intracranial abnormality. 2. 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 Electronically Signed   By: Markus Daft M.D.   On: 12/07/2018 11:08    EKG:  EKG:  unchanged from previous tracings, normal sinus rhythm, RBBB, peaked Twave. Vent. rate 83 BPM PR interval * ms QRS duration 145 ms QT/QTc 379/446 ms P-R-T axes 50 209 52 IMPRESSION AND PLAN:   78 y.o. male with past medical history of abdominal aneurysm, diabetes mellitus, GERD, hyperlipidemia, hypertension, right MCA stroke, CKD, right renal artery stenosis and CAD presenting to the ED with altered mental status.   1. Altered mental status -likely due to metabolic derangement in a patient with history of CKD now in AKI. Patient found on the fall per wife unclear if he had syncopal episode. - Admit to telemetry unit - CT head reviewed and shows no acute intracranial abnormality - UA negative for UTI - Chest xray - Carotid ultrasound - Echocardiogram - Check TSH and vitamin B12  2. Acute kidney injury on CKD -BUN/creatinine elevated  - hx of right renal artery stenosis - Patient report he has been taking Aleve daily - Hold nephrotoxins - Gentle IVFs - Hold NSAIDs and ACE inhibitors * renal ultrasound per nephrology - Nephrology consult  3. Hyperkalemia -likely due to AKI - Received IV calcium gluconate, insulin and dextrose in the ED - Check serial potassium  4. DM: His sugars have been well-controlled on current regimen  - Check Hbg A1c - Hold metformin in house - Continue home Insulin glargine at half dose - Low intensity sliding scale insulin coverage - FS BS qac and qhs   5. HLD  + Goal LDL<100 - Simvastatin 20mg  PO qhs  6. HTN  + Goal BP <130/80 - Continue metoprolol - Hold benazepril in the setting of AKI  7. History of right MCA strokes - ASA 81mg  PO daily   All the records are reviewed and case discussed with ED provider. Management plans discussed with the patient, family and they are in agreement.  CODE STATUS: FULL  TOTAL TIME TAKING CARE OF THIS PATIENT: 50 minutes.    on 12/07/2018 at 1:21 PM  Rufina Falco, DNP, FNP-BC [facility] Nurse  Practitioner Between 7am to 6pm - Pager [phone]  After 6pm go to [email or web address] - password [email or web address] [place] [facility]  Office  [phone]  CC: Primary care physician; Albina Billet, MD

## 2018-12-07 NOTE — ED Notes (Signed)
RN to bedside to introduce self to pt. Pt resting in bed in NAD at this time. Warm blanket given and wife at bedside.

## 2018-12-07 NOTE — Plan of Care (Signed)
Pt newly admitted as an in-patient.   Problem: Education: Goal: Knowledge of General Education information will improve Description: Including pain rating scale, medication(s)/side effects and non-pharmacologic comfort measures Outcome: Progressing   Problem: Health Behavior/Discharge Planning: Goal: Ability to manage health-related needs will improve Outcome: Progressing   Problem: Clinical Measurements: Goal: Ability to maintain clinical measurements within normal limits will improve Outcome: Progressing Goal: Will remain free from infection Outcome: Progressing Goal: Diagnostic test results will improve Outcome: Progressing Goal: Respiratory complications will improve Outcome: Progressing Goal: Cardiovascular complication will be avoided Outcome: Progressing   Problem: Activity: Goal: Risk for activity intolerance will decrease Outcome: Progressing   Problem: Nutrition: Goal: Adequate nutrition will be maintained Outcome: Progressing   Problem: Coping: Goal: Level of anxiety will decrease Outcome: Progressing   Problem: Elimination: Goal: Will not experience complications related to bowel motility Outcome: Progressing Goal: Will not experience complications related to urinary retention Outcome: Progressing   Problem: Pain Managment: Goal: General experience of comfort will improve Outcome: Progressing   Problem: Safety: Goal: Ability to remain free from injury will improve Outcome: Progressing   Problem: Skin Integrity: Goal: Risk for impaired skin integrity will decrease Outcome: Progressing

## 2018-12-07 NOTE — Progress Notes (Signed)
Pt arrived to room 238 from ED.  Oriented to room.  Bed low, brakes locked, bed alarm on.  Pt return demonstrated use of call light.

## 2018-12-07 NOTE — ED Notes (Addendum)
Pt with wife at bedside. Wife states that overnight he started to become confused. States he has had a stroke in the past. Pt states he is diabetic, takes insulin and pills. States CBG at home today 128. Also states he hasn't been taking all his medications d/t decreased appetite. Unsure which meds he has been and hasn't been taking. Pt wife states she found him in the floor overnight.  Unsure if he fell or laid down. Pt states for about 3 days his vision has been worse, hx macular degeneration. Also c/o of feeling off balance x few days. Upon arrival answering questions appropriately. Pt is hard of hearing, states R ear feels like "full or pressure" which is new. Also c/o SOB and non-productive cough over last few days. Denies fever. Denies nausea. Pt also states he is urinating more frequently, every 30 minutes during the night. Equal grip strength, no facial droop noted.

## 2018-12-07 NOTE — Progress Notes (Signed)
Pt impulsive and climbed out of the end of the bed despite bed alarm activating.  Standing to use the urinal.  Re-oriented pt and returned to bed.  Pt verbalized understanding and return demonstrated how to call for help using the call light.

## 2018-12-08 ENCOUNTER — Inpatient Hospital Stay: Payer: Medicare HMO

## 2018-12-08 ENCOUNTER — Encounter: Payer: Self-pay | Admitting: *Deleted

## 2018-12-08 DIAGNOSIS — E875 Hyperkalemia: Secondary | ICD-10-CM

## 2018-12-08 LAB — BASIC METABOLIC PANEL
Anion gap: 6 (ref 5–15)
Anion gap: 8 (ref 5–15)
BUN: 44 mg/dL — ABNORMAL HIGH (ref 8–23)
BUN: 42 mg/dL — ABNORMAL HIGH (ref 8–23)
CO2: 19 mmol/L — ABNORMAL LOW (ref 22–32)
CO2: 21 mmol/L — ABNORMAL LOW (ref 22–32)
Calcium: 8.9 mg/dL (ref 8.9–10.3)
Calcium: 9.1 mg/dL (ref 8.9–10.3)
Chloride: 112 mmol/L — ABNORMAL HIGH (ref 98–111)
Chloride: 110 mmol/L (ref 98–111)
Creatinine, Ser: 2.74 mg/dL — ABNORMAL HIGH (ref 0.61–1.24)
Creatinine, Ser: 2.71 mg/dL — ABNORMAL HIGH (ref 0.61–1.24)
GFR calc Af Amer: 24 mL/min — ABNORMAL LOW (ref >60)
GFR calc Af Amer: 25 mL/min — ABNORMAL LOW (ref >60)
GFR calc non Af Amer: 21 mL/min — ABNORMAL LOW (ref >60)
GFR calc non Af Amer: 21 mL/min — ABNORMAL LOW (ref >60)
Glucose, Bld: 138 mg/dL — ABNORMAL HIGH (ref 70–99)
Glucose, Bld: 151 mg/dL — ABNORMAL HIGH (ref 70–99)
Potassium: 6.5 mmol/L (ref 3.5–5.1)
Potassium: 5.8 mmol/L — ABNORMAL HIGH (ref 3.5–5.1)
Sodium: 137 mmol/L (ref 135–145)
Sodium: 139 mmol/L (ref 135–145)

## 2018-12-08 LAB — GLUCOSE, CAPILLARY
Glucose-Capillary: 170 mg/dL — ABNORMAL HIGH (ref 70–99)
Glucose-Capillary: 120 mg/dL — ABNORMAL HIGH (ref 70–99)
Glucose-Capillary: 105 mg/dL — ABNORMAL HIGH (ref 70–99)
Glucose-Capillary: 108 mg/dL — ABNORMAL HIGH (ref 70–99)

## 2018-12-08 LAB — ECHOCARDIOGRAM COMPLETE
Height: 69 in
Weight: 2998.4 oz

## 2018-12-08 LAB — POTASSIUM
Potassium: 5.4 mmol/L — ABNORMAL HIGH (ref 3.5–5.1)
Potassium: 5.6 mmol/L — ABNORMAL HIGH (ref 3.5–5.1)
Potassium: 5.7 mmol/L — ABNORMAL HIGH (ref 3.5–5.1)
Potassium: 5.2 mmol/L — ABNORMAL HIGH (ref 3.5–5.1)
Potassium: 5.8 mmol/L — ABNORMAL HIGH (ref 3.5–5.1)

## 2018-12-08 LAB — CBC
HCT: 40.4 % (ref 39.0–52.0)
Hemoglobin: 12.9 g/dL — ABNORMAL LOW (ref 13.0–17.0)
MCH: 28.4 pg (ref 26.0–34.0)
MCHC: 31.9 g/dL (ref 30.0–36.0)
MCV: 88.8 fL (ref 80.0–100.0)
Platelets: 182 10*3/uL (ref 150–400)
RBC: 4.55 MIL/uL (ref 4.22–5.81)
RDW: 13.8 % (ref 11.5–15.5)
WBC: 9 10*3/uL (ref 4.0–10.5)
nRBC: 0 % (ref 0.0–0.2)

## 2018-12-08 LAB — NA AND K (SODIUM & POTASSIUM), RAND UR
Potassium Urine: 11 mmol/L
Sodium, Ur: 68 mmol/L

## 2018-12-08 LAB — SARS CORONAVIRUS 2 (TAT 6-24 HRS): SARS Coronavirus 2: NEGATIVE

## 2018-12-08 MED ORDER — DEXTROSE 50 % IV SOLN
1.0000 | Freq: Once | INTRAVENOUS | Status: AC
  Administered 2018-12-08: 50 mL via INTRAVENOUS
  Filled 2018-12-08: qty 50

## 2018-12-08 MED ORDER — CYANOCOBALAMIN 1000 MCG/ML IJ SOLN
1000.0000 ug | Freq: Once | INTRAMUSCULAR | Status: AC
  Administered 2018-12-08: 1000 ug via INTRAMUSCULAR
  Filled 2018-12-08: qty 1

## 2018-12-08 MED ORDER — SODIUM POLYSTYRENE SULFONATE 15 GM/60ML PO SUSP
15.0000 g | Freq: Once | ORAL | Status: AC
  Administered 2018-12-08: 15 g via ORAL
  Filled 2018-12-08: qty 60

## 2018-12-08 MED ORDER — ALBUTEROL SULFATE (2.5 MG/3ML) 0.083% IN NEBU
10.0000 mg | INHALATION_SOLUTION | Freq: Once | RESPIRATORY_TRACT | Status: AC
  Administered 2018-12-08: 10 mg via RESPIRATORY_TRACT
  Filled 2018-12-08: qty 12

## 2018-12-08 MED ORDER — PATIROMER SORBITEX CALCIUM 8.4 G PO PACK
16.8000 g | PACK | Freq: Every day | ORAL | Status: DC
  Administered 2018-12-08 – 2018-12-10 (×3): 16.8 g via ORAL
  Filled 2018-12-08 (×3): qty 2

## 2018-12-08 MED ORDER — SODIUM ZIRCONIUM CYCLOSILICATE 10 G PO PACK
10.0000 g | PACK | ORAL | Status: AC
  Administered 2018-12-08: 10 g via ORAL
  Filled 2018-12-08: qty 1

## 2018-12-08 MED ORDER — INSULIN ASPART 100 UNIT/ML IV SOLN
5.0000 [IU] | Freq: Once | INTRAVENOUS | Status: AC
  Administered 2018-12-08: 5 [IU] via INTRAVENOUS
  Filled 2018-12-08: qty 0.05

## 2018-12-08 NOTE — Progress Notes (Signed)
CRITICAL VALUE STICKER  CRITICAL VALUE: potassium 6.6  MD NOTIFIED: Gardiner Barefoot, NP  TIME OF NOTIFICATION: 0003 12/08/2018  RESPONSE: new orders placed from angela seals, NP.  Will continue to monitor.

## 2018-12-08 NOTE — Progress Notes (Signed)
[place] at [place] NAME: Robert Conrad    MR#:  [id number]  DATE OF BIRTH:  1940/06/26  SUBJECTIVE:  CHIEF COMPLAINT:   Chief Complaint  Patient presents with  . Altered Mental Status  . Hallucinations   Came with confusion, noted to have acute renal failure and hyperkalemia.  Slightly better today as per wife was present in the room.  REVIEW OF SYSTEMS:  CONSTITUTIONAL: No fever, fatigue or weakness.  EYES: No blurred or double vision.  EARS, NOSE, AND THROAT: No tinnitus or ear pain.  RESPIRATORY: No cough, shortness of breath, wheezing or hemoptysis.  CARDIOVASCULAR: No chest pain, orthopnea, edema.  GASTROINTESTINAL: No nausea, vomiting, diarrhea or abdominal pain.  GENITOURINARY: No dysuria, hematuria.  ENDOCRINE: No polyuria, nocturia,  HEMATOLOGY: No anemia, easy bruising or bleeding SKIN: No rash or lesion. MUSCULOSKELETAL: No joint pain or arthritis.   NEUROLOGIC: No tingling, numbness, weakness.  PSYCHIATRY: No anxiety or depression.   ROS  DRUG ALLERGIES:   Allergies  Allergen Reactions  . Ciprofloxacin Hcl Rash    VITALS:  Blood pressure 112/66, pulse 89, temperature 97.9 F (36.6 C), temperature source Oral, resp. rate 20, height 5\' 9"  (1.753 m), weight 85 kg, SpO2 92 %.  PHYSICAL EXAMINATION:  GENERAL:  78 y.o.-year-old patient lying in the bed with no acute distress.  EYES: Pupils equal, round, reactive to light and accommodation. No scleral icterus. Extraocular muscles intact.  HEENT: Head atraumatic, normocephalic. Oropharynx and nasopharynx clear. Very hard of hearing. NECK:  Supple, no jugular venous distention. No thyroid enlargement, no tenderness.  LUNGS: Normal breath sounds bilaterally, no wheezing, rales,rhonchi or crepitation. No use of accessory muscles of respiration.  CARDIOVASCULAR: S1, S2 normal. No murmurs, rubs, or gallops.  ABDOMEN: Soft, nontender, nondistended. Bowel sounds present. No  organomegaly or mass.  EXTREMITIES: No pedal edema, cyanosis, or clubbing.  NEUROLOGIC: Cranial nerves II through XII are intact. Muscle strength 4/5 in all extremities. Sensation intact. Gait not checked.  PSYCHIATRIC: The patient is alert and oriented x 2.  SKIN: No obvious rash, lesion, or ulcer.   Physical Exam LABORATORY PANEL:   CBC Recent Labs  Lab 12/08/18 0544  WBC 9.0  HGB 12.9*  HCT 40.4  PLT 182   ------------------------------------------------------------------------------------------------------------------  Chemistries  Recent Labs  Lab 12/07/18 1235  12/08/18 0544  12/08/18 1214  NA 134*   < > 139  --   --   K 6.6*   < > 5.8*   < > 5.7*  CL 111   < > 110  --   --   CO2 17*   < > 21*  --   --   GLUCOSE 145*   < > 151*  --   --   BUN 48*   < > 42*  --   --   CREATININE 3.14*   < > 2.71*  --   --   CALCIUM 8.7*   < > 9.1  --   --   AST 14*  --   --   --   --   ALT 16  --   --   --   --   ALKPHOS 41  --   --   --   --   BILITOT 0.7  --   --   --   --    < > = values in this interval not displayed.   ------------------------------------------------------------------------------------------------------------------  Cardiac Enzymes No results for input(s): TROPONINI  in the last 168 hours. ------------------------------------------------------------------------------------------------------------------  RADIOLOGY:  Dg Chest 2 View  Result Date: 12/07/2018 CLINICAL DATA:  78 year old male with confusion, hallucinations since yesterday. Found down at 0400 hours. EXAM: CHEST - 2 VIEW COMPARISON:  04/22/2018 chest radiographs and earlier. FINDINGS: Lung volumes and mediastinal contours remain within normal limits. Prior CABG. The lungs appear stable in clear. No pneumothorax or pleural effusion. No acute osseous abnormality identified. IMPRESSION: No acute cardiopulmonary abnormality. Electronically Signed   By: Genevie Ann M.D.   On: 12/07/2018 14:52   Ct Head Wo  Contrast  Result Date: 12/07/2018 CLINICAL DATA:  Altered mental status. EXAM: CT HEAD WITHOUT CONTRAST TECHNIQUE: Contiguous axial images were obtained from the base of the skull through the vertex without intravenous contrast. COMPARISON:  03/18/2013 FINDINGS: Brain: Again noted is encephalomalacia in the right temporoparietal region compatible with prior insult or infarct. Stable low-density in the right frontal periventricular white matter. Negative for acute hemorrhage, mass lesion, midline shift, hydrocephalus or new large infarct. Vascular: Atherosclerotic calcifications involving the vertebral arteries. Skull: Normal. Negative for fracture or focal lesion. Sinuses/Orbits: No acute finding. Other: None. IMPRESSION: 1. No acute intracranial abnormality. 2. 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 Electronically Signed   By: Markus Daft M.D.   On: 12/07/2018 11:08   US Renal  Result Date: 12/08/2018 CLINICAL DATA:  Hyperkalemia.  Renal artery stenosis. EXAM: RENAL / URINARY TRACT ULTRASOUND COMPLETE COMPARISON:  CT 01/01/2015 FINDINGS: Right Kidney: Renal measurements: 9.1 x 5.0 x 5.5 cm = volume: 130 mL . Echogenicity within normal limits. No mass or hydronephrosis visualized. Left Kidney: Renal measurements: 10.5 x 4.6 x 3.7 cm = volume: 92 mL. Echogenicity within normal limits. No mass or hydronephrosis visualized. Insignificant 1 cm cyst at the upper pole. Bladder: Appears normal for degree of bladder distention. IMPRESSION: The kidneys are small but there is no acute finding. No sign of hydronephrosis or focal lesion. Left kidney smaller than the right. Electronically Signed   By: Nelson Chimes M.D.   On: 12/08/2018 09:20    ASSESSMENT AND PLAN:   Active Problems:   Altered mental status  78 y.o. male with past medical history of abdominal aneurysm, diabetes mellitus, GERD, hyperlipidemia, hypertension, right MCA stroke, CKD, right renal artery stenosis and CAD presenting to the ED with  altered mental status.  1. Altered mental status -likely due to metabolic derangement in a patient with history of CKD now in AKI. Patient found on the floor per wife unclear if he had syncopal episode. - Admitted to telemetry unit - CT head reviewed and shows no acute intracranial abnormality, if pt does not improve with improving renal func then may need MRI brain. - UA negative for UTI - Carotid ultrasound done - results pending. - Echocardiogram- EF 60%, Impaired relaxation, no major valvular abnormalities. - Checked TSH- 1.09 and vitamin B12- 71- will give Vit b12 IM once and need oral replacement on discharge.  2. Acute kidney injury on CKD 3 -BUN/creatinine elevated  - hx of right renal artery stenosis - Patient report he has been taking Aleve frequently - Hold nephrotoxins - Gentle IVFs - Hold NSAIDs and ACE inhibitors - renal ultrasound per nephrology- no hydronephrosis. - Nephrology consult appreciated, bicarb drip and close monitor. Pt's baseline creatinin is around 1.5 per nephro.  3. Hyperkalemia -likely due to AKI - Received IV calcium gluconate, insulin and dextrose in the ED - Check serial potassium - started on veltassa by nephro  4. DM: His  sugars have been well-controlled on current regimen  - Check Hbg A1c - Hold metformin in house - Continue home Insulin glargine at half dose - Low intensity sliding scale insulin coverage - FS BS qac and qhs   5. HLD  + Goal LDL<100 - Simvastatin 20mg  PO qhs  6. HTN  + Goal BP <130/80 - Continue metoprolol - Hold benazepril in the setting of AKI  7. History of right MCA strokes - ASA 81mg  PO daily    All the records are reviewed and case discussed with Care Management/Social Workerr. Management plans discussed with the patient, family and they are in agreement.  CODE STATUS: Full.  TOTAL TIME TAKING CARE OF THIS PATIENT: 35 minutes.   Spoke to his wife in room today.  POSSIBLE D/C IN 1-2 DAYS,  DEPENDING ON CLINICAL CONDITION.   Vaughan Basta M.D on 12/08/2018   Between 7am to 6pm - Pager - [phone]  After 6pm go to [email or web address] - password [email or web address] [place] [facility] [facility]  Office  [phone]  CC: Primary care physician; Albina Billet, MD  Note: This dictation was prepared with Dragon dictation along with smaller phrase technology. Any transcriptional errors that result from this process are unintentional.

## 2018-12-08 NOTE — Progress Notes (Signed)
CRITICAL VALUE STICKER  CRITICAL VALUE: potassium 5.4  MD NOTIFIED: Dr. Argie Ramming  RESPONSE: 15g kayexalate once   Will give and continue to monitor. Conley Simmonds, RN, BSN

## 2018-12-08 NOTE — Progress Notes (Signed)
Family Meeting Note  Advance Directive:no  Today a meeting took place with the Patient and spouse.  The following clinical team members were present during this meeting:MD  The following were discussed:Patient's diagnosis: CKD stage 3- ac renal failure, hyperkalemia, Patient's progosis: Unable to determine and Goals for treatment: Full Code  Additional follow-up to be provided: nephrology  Time spent during discussion:20 minutes  Vaughan Basta, MD

## 2018-12-08 NOTE — Plan of Care (Signed)
  Problem: Education: Goal: Knowledge of General Education information will improve Description: Including pain rating scale, medication(s)/side effects and non-pharmacologic comfort measures Outcome: Progressing   Problem: Health Behavior/Discharge Planning: Goal: Ability to manage health-related needs will improve Outcome: Progressing   Problem: Clinical Measurements: Goal: Ability to maintain clinical measurements within normal limits will improve Outcome: Not Progressing Note: Potassium still elevated over 5.5. Administered Veltassa this AM. Will continue to monitor lab values. Wenda Low [facility]

## 2018-12-08 NOTE — Progress Notes (Addendum)
[facility]  ROUNDING NOTE   Subjective:   Wife at bedside.  Patient confused. Overnight, required security to help with reorientation. Patient urinated on the floor of the room.   UOP 1100 recorded Bicarb gtt at 66mL/hr  K 5.8  Creatinine 2.7 (3.14)  Objective:  Vital signs in last 24 hours:  Temp:  [97.9 F (36.6 C)-98.2 F (36.8 C)] 97.9 F (36.6 C) (08/05 0040) Pulse Rate:  [81-114] 89 (08/05 0600) Resp:  [16-25] 20 (08/04 1957) BP: (85-145)/(55-83) 112/66 (08/05 0600) SpO2:  [92 %-100 %] 92 % (08/05 0501) Weight:  [85 kg] 85 kg (08/04 1534)  Weight change:  Filed Weights   12/07/18 1006 12/07/18 1534  Weight: 86 kg 85 kg    Intake/Output: I/O last 3 completed shifts: In: 673.4 [I.V.:629.4; IV Piggyback:44] Out: 3016 [WFUXN:2355]   Intake/Output this shift:  No intake/output data recorded.  Physical Exam: General: NAD, laying in bed  Head: Normocephalic, atraumatic. Moist oral mucosal membranes  Eyes: Anicteric, PERRL  Neck: Supple, trachea midline  Lungs:  Clear to auscultation  Heart: Regular rate and rhythm  Abdomen:  Soft, nontender,   Extremities:  no peripheral edema.  Neurologic: Alert to self only  Skin: No lesions        Basic Metabolic Panel: Recent Labs  Lab 12/07/18 1021 12/07/18 1235 12/07/18 1408 12/07/18 1542 12/07/18 2249 12/08/18 0040 12/08/18 0428 12/08/18 0544  NA 134* 134*  --   --   --  137  --  139  K 7.0* 6.6* 6.2* 6.8* 6.6* 6.5* 5.4* 5.8*  CL 110 111  --   --   --  112*  --  110  CO2 17* 17*  --   --   --  19*  --  21*  GLUCOSE 163* 145* 119*  --   --  138*  --  151*  BUN 48* 48*  --   --   --  44*  --  42*  CREATININE 3.14* 3.14*  --   --   --  2.74*  --  2.71*  CALCIUM 9.2 8.7*  --   --   --  8.9  --  9.1    Liver Function Tests: Recent Labs  Lab 12/07/18 1021 12/07/18 1235  AST 14* 14*  ALT 17 16  ALKPHOS 46 41  BILITOT 0.8 0.7  PROT 7.2 6.5  ALBUMIN 4.2 3.7   No results for input(s):  LIPASE, AMYLASE in the last 168 hours. No results for input(s): AMMONIA in the last 168 hours.  CBC: Recent Labs  Lab 12/07/18 1021 12/08/18 0544  WBC 11.6* 9.0  HGB 13.8 12.9*  HCT 43.4 40.4  MCV 89.9 88.8  PLT 218 182    Cardiac Enzymes: No results for input(s): CKTOTAL, CKMB, CKMBINDEX, TROPONINI in the last 168 hours.  BNP: Invalid input(s): POCBNP  CBG: Recent Labs  Lab 12/07/18 1646 12/07/18 1716 12/07/18 1821 12/07/18 2148 12/08/18 0143  GLUCAP 123* 112* 152* 136* 170*    Microbiology: Results for orders placed or performed during the hospital encounter of 12/07/18  SARS CORONAVIRUS 2 Nasal Swab Aptima Multi Swab     Status: None   Collection Time: 12/07/18  2:34 PM   Specimen: Aptima Multi Swab; Nasal Swab  Result Value Ref Range Status   SARS Coronavirus 2 NEGATIVE NEGATIVE Final    Comment: (NOTE) SARS-CoV-2 target nucleic acids are NOT DETECTED. The SARS-CoV-2 RNA is generally detectable in upper and lower respiratory specimens during  the acute phase of infection. Negative results do not preclude SARS-CoV-2 infection, do not rule out co-infections with other pathogens, and should not be used as the sole basis for treatment or other patient management decisions. Negative results must be combined with clinical observations, patient history, and epidemiological information. The expected result is Negative. Fact Sheet for Patients: SugarRoll.be Fact Sheet for Healthcare Providers: https://www.woods-mathews.com/ This test is not yet approved or cleared by the [place] FDA and  has been authorized for detection and/or diagnosis of SARS-CoV-2 by FDA under an Emergency Use Authorization (EUA). This EUA will remain  in effect (meaning this test can be used) for the duration of the COVID-19 declaration under Section 56 4(b)(1) of the Act, 21 U.S.C. section 360bbb-3(b)(1), unless the authorization is terminated  or revoked sooner. Performed at [facility], [place] [address] [place], [place] [address]     Coagulation Studies: No results for input(s): LABPROT, INR in the last 72 hours.  Urinalysis: Recent Labs    12/07/18 1123  COLORURINE STRAW*  LABSPEC 1.008  PHURINE 5.0  GLUCOSEU 50*  HGBUR NEGATIVE  BILIRUBINUR NEGATIVE  KETONESUR NEGATIVE  PROTEINUR NEGATIVE  NITRITE NEGATIVE  LEUKOCYTESUR NEGATIVE      Imaging: Dg Chest 2 View  Result Date: 12/07/2018 CLINICAL DATA:  77 year old male with confusion, hallucinations since yesterday. Found down at 0400 hours. EXAM: CHEST - 2 VIEW COMPARISON:  04/22/2018 chest radiographs and earlier. FINDINGS: Lung volumes and mediastinal contours remain within normal limits. Prior CABG. The lungs appear stable in clear. No pneumothorax or pleural effusion. No acute osseous abnormality identified. IMPRESSION: No acute cardiopulmonary abnormality. Electronically Signed   By: Genevie Ann M.D.   On: 12/07/2018 14:52   Ct Head Wo Contrast  Result Date: 12/07/2018 CLINICAL DATA:  Altered mental status. EXAM: CT HEAD WITHOUT CONTRAST TECHNIQUE: Contiguous axial images were obtained from the base of the skull through the vertex without intravenous contrast. COMPARISON:  03/18/2013 FINDINGS: Brain: Again noted is encephalomalacia in the right temporoparietal region compatible with prior insult or infarct. Stable low-density in the right frontal periventricular white matter. Negative for acute hemorrhage, mass lesion, midline shift, hydrocephalus or new large infarct. Vascular: Atherosclerotic calcifications involving the vertebral arteries. Skull: Normal. Negative for fracture or focal lesion. Sinuses/Orbits: No acute finding. Other: None. IMPRESSION: 1. No acute intracranial abnormality. 2. 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 Electronically Signed   By: Markus Daft M.D.   On: 12/07/2018 11:08   US Renal  Result Date: 12/08/2018 CLINICAL DATA:   Hyperkalemia.  Renal artery stenosis. EXAM: RENAL / URINARY TRACT ULTRASOUND COMPLETE COMPARISON:  CT 01/01/2015 FINDINGS: Right Kidney: Renal measurements: 9.1 x 5.0 x 5.5 cm = volume: 130 mL . Echogenicity within normal limits. No mass or hydronephrosis visualized. Left Kidney: Renal measurements: 10.5 x 4.6 x 3.7 cm = volume: 92 mL. Echogenicity within normal limits. No mass or hydronephrosis visualized. Insignificant 1 cm cyst at the upper pole. Bladder: Appears normal for degree of bladder distention. IMPRESSION: The kidneys are small but there is no acute finding. No sign of hydronephrosis or focal lesion. Left kidney smaller than the right. Electronically Signed   By: Nelson Chimes M.D.   On: 12/08/2018 09:20     Medications:   .  sodium bicarbonate  infusion 1000 mL 50 mL/hr at 12/08/18 1324   . aspirin EC  81 mg Oral Daily  . heparin  5,000 Units Subcutaneous Q8H  . insulin aspart  0-5 Units Subcutaneous  QHS  . insulin aspart  0-9 Units Subcutaneous TID WC  . insulin glargine  16 Units Subcutaneous QHS  . multivitamin-lutein  1 capsule Oral Daily  . pantoprazole  40 mg Oral Daily  . patiromer  16.8 g Oral Daily  . simvastatin  20 mg Oral Daily     Assessment/ Plan:  Mr. Locke Barrell Mabie is a 78 y.o. white male with chronic kidney disease stage IV followed by [facility], hypertension, diabetes mellitus type II, congestive heart failure, coronary artery disease status post CABG, hyperlipidemia, AAA, peripheral vascular disease, renal artery disease, gout, peptic ulcer disease/GERD, macular degeneration , who was admitted to [facility] on 12/07/2018 for altered mental status   1. Acute renal failure with hyperkalemia, hyponatremia and metabolic acidosis on chronic kidney disease stage III with proteinuria: unknown baseline creatinine. Follows with [facility], last seen in 11/2017. Chronic kidney disease secondary to diabetic nephropathy.  Acute renal failure source is unclear.  Concern for obstructive uropathy, progression of chronic kidney disease or prerenal azotemia.  Encephalopathy can be explained by uremia but full work up is necessary Urinalysis with glucosuria. Ultrasound with small kidneys Sodium has improved.  - No indication for dialysis.  - holding benazepril - Continue sodium bicarbonate infusion - Start patiromer due to hyperkalemia - Get baseline labs from PCP.   2. Hypertension: blood pressure at goal. Echocardiogram with impaired relaxation. Home regimen of metoprolol and benazepril. Holding medications.   3. Diabetes mellitus type II with chronic kidney disease: noninsulin dependent. Hemoglobin A1c of 6.8%.   Addendum: outpatient labs received from Dr. Juanell Fairly office. Creatinine was 1.48, GFR of 44 on 09/15/2018.    LOS: 1 Shellie Rogoff 8/5/202010:27 AM

## 2018-12-08 NOTE — Plan of Care (Signed)
  Problem: Activity: Goal: Risk for activity intolerance will decrease Outcome: Progressing Note: Up in room and has been up in hallway with one assist, very shakey though   Problem: Nutrition: Goal: Adequate nutrition will be maintained Outcome: Progressing   Problem: Elimination: Goal: Will not experience complications related to urinary retention Outcome: Progressing   Problem: Pain Managment: Goal: General experience of comfort will improve Outcome: Progressing Note: No complaints of pain this shift   Problem: Skin Integrity: Goal: Risk for impaired skin integrity will decrease Outcome: Progressing

## 2018-12-09 ENCOUNTER — Inpatient Hospital Stay: Payer: Medicare HMO

## 2018-12-09 LAB — GLUCOSE, CAPILLARY
Glucose-Capillary: 107 mg/dL — ABNORMAL HIGH (ref 70–99)
Glucose-Capillary: 164 mg/dL — ABNORMAL HIGH (ref 70–99)
Glucose-Capillary: 108 mg/dL — ABNORMAL HIGH (ref 70–99)
Glucose-Capillary: 144 mg/dL — ABNORMAL HIGH (ref 70–99)

## 2018-12-09 LAB — POTASSIUM
Potassium: 5.3 mmol/L — ABNORMAL HIGH (ref 3.5–5.1)
Potassium: 5.6 mmol/L — ABNORMAL HIGH (ref 3.5–5.1)
Potassium: 5.9 mmol/L — ABNORMAL HIGH (ref 3.5–5.1)

## 2018-12-09 MED ORDER — CYANOCOBALAMIN 1000 MCG/ML IJ SOLN
1000.0000 ug | Freq: Every day | INTRAMUSCULAR | Status: AC
  Administered 2018-12-10: 1000 ug via INTRAMUSCULAR
  Filled 2018-12-09: qty 1

## 2018-12-09 MED ORDER — CYANOCOBALAMIN 1000 MCG/ML IJ SOLN: 1000.0000 ug | Freq: Every day | INTRAMUSCULAR | Status: DC

## 2018-12-09 NOTE — Progress Notes (Signed)
[facility]  ROUNDING NOTE   Subjective:   Wife at bedside.  Patient is hard of hearing  Doing better today No LE edema 08/05 0701 - 08/06 0700 In: 1325.2 [P.O.:240; I.V.:1085.2] Out: 200 [Urine:200]    Objective:  Vital signs in last 24 hours:  Temp:  [97.8 F (36.6 C)-98.7 F (37.1 C)] 98.2 F (36.8 C) (08/06 0733) Pulse Rate:  [81-98] 98 (08/06 0733) Resp:  [20] 20 (08/06 0733) BP: (127-139)/(60-80) 137/80 (08/06 0733) SpO2:  [96 %-100 %] 100 % (08/06 0733)  Weight change:  Filed Weights   12/07/18 1006 12/07/18 1534  Weight: 86 kg 85 kg    Intake/Output: I/O last 3 completed shifts: In: 1954.6 [P.O.:240; I.V.:1714.6] Out: 1100 [Urine:1100]   Intake/Output this shift:  Total I/O In: 480 [P.O.:480] Out: 200 [Urine:200]  Physical Exam: General: NAD, laying in bed  Head: Normocephalic, atraumatic. Moist oral mucosal membranes  Eyes: Anicteric,   Neck: Supple, trachea midline  Lungs:  Clear to auscultation  Heart: Irregular   Abdomen:  Soft, nontender,   Extremities:  no peripheral edema.  Neurologic: Alert oriented, able to answer questions  Skin: No lesions        Basic Metabolic Panel: Recent Labs  Lab 12/07/18 1021 12/07/18 1235 12/07/18 1408  12/08/18 0040  12/08/18 0544  12/08/18 1614 12/08/18 2028 12/09/18 0005 12/09/18 0417 12/09/18 0930  NA 134* 134*  --   --  137  --  139  --   --   --   --   --   --   K 7.0* 6.6* 6.2*   < > 6.5*   < > 5.8*   < > 5.2* 5.8* 5.9* 5.6* 5.3*  CL 110 111  --   --  112*  --  110  --   --   --   --   --   --   CO2 17* 17*  --   --  19*  --  21*  --   --   --   --   --   --   GLUCOSE 163* 145* 119*  --  138*  --  151*  --   --   --   --   --   --   BUN 48* 48*  --   --  44*  --  42*  --   --   --   --   --   --   CREATININE 3.14* 3.14*  --   --  2.74*  --  2.71*  --   --   --   --   --   --   CALCIUM 9.2 8.7*  --   --  8.9  --  9.1  --   --   --   --   --   --    < > = values in this interval  not displayed.    Liver Function Tests: Recent Labs  Lab 12/07/18 1021 12/07/18 1235  AST 14* 14*  ALT 17 16  ALKPHOS 46 41  BILITOT 0.8 0.7  PROT 7.2 6.5  ALBUMIN 4.2 3.7   No results for input(s): LIPASE, AMYLASE in the last 168 hours. No results for input(s): AMMONIA in the last 168 hours.  CBC: Recent Labs  Lab 12/07/18 1021 12/08/18 0544  WBC 11.6* 9.0  HGB 13.8 12.9*  HCT 43.4 40.4  MCV 89.9 88.8  PLT 218 182    Cardiac Enzymes: No results  for input(s): CKTOTAL, CKMB, CKMBINDEX, TROPONINI in the last 168 hours.  BNP: Invalid input(s): POCBNP  CBG: Recent Labs  Lab 12/08/18 1159 12/08/18 1659 12/08/18 2133 12/09/18 0735 12/09/18 1143  GLUCAP 120* 105* 108* 107* 164*    Microbiology: Results for orders placed or performed during the hospital encounter of 12/07/18  SARS CORONAVIRUS 2 Nasal Swab Aptima Multi Swab     Status: None   Collection Time: 12/07/18  2:34 PM   Specimen: Aptima Multi Swab; Nasal Swab  Result Value Ref Range Status   SARS Coronavirus 2 NEGATIVE NEGATIVE Final    Comment: (NOTE) SARS-CoV-2 target nucleic acids are NOT DETECTED. The SARS-CoV-2 RNA is generally detectable in upper and lower respiratory specimens during the acute phase of infection. Negative results do not preclude SARS-CoV-2 infection, do not rule out co-infections with other pathogens, and should not be used as the sole basis for treatment or other patient management decisions. Negative results must be combined with clinical observations, patient history, and epidemiological information. The expected result is Negative. Fact Sheet for Patients: SugarRoll.be Fact Sheet for Healthcare Providers: https://www.woods-mathews.com/ This test is not yet approved or cleared by the [place] FDA and  has been authorized for detection and/or diagnosis of SARS-CoV-2 by FDA under an Emergency Use Authorization (EUA). This EUA  will remain  in effect (meaning this test can be used) for the duration of the COVID-19 declaration under Section 56 4(b)(1) of the Act, 21 U.S.C. section 360bbb-3(b)(1), unless the authorization is terminated or revoked sooner. Performed at [facility], [place] [address], [place], [place] [address]     Coagulation Studies: No results for input(s): LABPROT, INR in the last 72 hours.  Urinalysis: Recent Labs    12/07/18 1123  COLORURINE STRAW*  LABSPEC 1.008  PHURINE 5.0  GLUCOSEU 50*  HGBUR NEGATIVE  BILIRUBINUR NEGATIVE  KETONESUR NEGATIVE  PROTEINUR NEGATIVE  NITRITE NEGATIVE  LEUKOCYTESUR NEGATIVE      Imaging: Dg Chest 2 View  Result Date: 12/07/2018 CLINICAL DATA:  78 year old male with confusion, hallucinations since yesterday. Found down at 0400 hours. EXAM: CHEST - 2 VIEW COMPARISON:  04/22/2018 chest radiographs and earlier. FINDINGS: Lung volumes and mediastinal contours remain within normal limits. Prior CABG. The lungs appear stable in clear. No pneumothorax or pleural effusion. No acute osseous abnormality identified. IMPRESSION: No acute cardiopulmonary abnormality. Electronically Signed   By: Genevie Ann M.D.   On: 12/07/2018 14:52   US Renal  Result Date: 12/08/2018 CLINICAL DATA:  Hyperkalemia.  Renal artery stenosis. EXAM: RENAL / URINARY TRACT ULTRASOUND COMPLETE COMPARISON:  CT 01/01/2015 FINDINGS: Right Kidney: Renal measurements: 9.1 x 5.0 x 5.5 cm = volume: 130 mL . Echogenicity within normal limits. No mass or hydronephrosis visualized. Left Kidney: Renal measurements: 10.5 x 4.6 x 3.7 cm = volume: 92 mL. Echogenicity within normal limits. No mass or hydronephrosis visualized. Insignificant 1 cm cyst at the upper pole. Bladder: Appears normal for degree of bladder distention. IMPRESSION: The kidneys are small but there is no acute finding. No sign of hydronephrosis or focal lesion. Left kidney smaller than the right. Electronically Signed   By: Nelson Chimes  M.D.   On: 12/08/2018 09:20   US Carotid Bilateral  Result Date: 12/09/2018 CLINICAL DATA:  Syncope, previous stroke. Coronary artery disease, hyperlipidemia, diabetes. EXAM: BILATERAL CAROTID DUPLEX ULTRASOUND TECHNIQUE: Pearline Cables scale imaging, color Doppler and duplex ultrasound were performed of bilateral carotid and vertebral arteries in the neck. COMPARISON:  03/05/2010 FINDINGS: Criteria: Quantification  of carotid stenosis is based on velocity parameters that correlate the residual internal carotid diameter with NASCET-based stenosis levels, using the diameter of the distal internal carotid lumen as the denominator for stenosis measurement. The following velocity measurements were obtained: RIGHT ICA: 37/4 cm/sec CCA: 93/2 cm/sec SYSTOLIC ICA/CCA RATIO:  0.5 ECA: 72 cm/sec LEFT ICA: 93/30 cm/sec CCA: 35/57 cm/sec SYSTOLIC ICA/CCA RATIO:  1.3 ECA: 70 cm/sec RIGHT CAROTID ARTERY: Intimal thickening in the common carotid artery. Partially calcified plaque in the distal common carotid artery, bulb, proximal ECA and ICA. High resistance common carotid waveform. Apparent ICA occlusion shortly beyond its origin, as before. No string sign on power Doppler. External carotid patent. RIGHT VERTEBRAL ARTERY:  Normal flow direction and waveform. LEFT CAROTID ARTERY: Intimal thickening through the common carotid artery. Calcified plaque in the bulb, proximal ECA and ICA. Normal waveforms and color Doppler signal. LEFT VERTEBRAL ARTERY:  Normal flow direction and waveform. IMPRESSION: 1. Chronic  right ICA occlusion. 2. Left carotid bifurcation plaque resulting in less than 50% diameter stenosis. 3.  Antegrade bilateral vertebral arterial flow. Electronically Signed   By: Lucrezia Europe M.D.   On: 12/09/2018 10:05     Medications:   .  sodium bicarbonate  infusion 1000 mL 50 mL/hr at 12/09/18 1410   . aspirin EC  81 mg Oral Daily  . [START ON 12/10/2018] cyanocobalamin  1,000 mcg Intramuscular Daily  . heparin  5,000  Units Subcutaneous Q8H  . insulin aspart  0-5 Units Subcutaneous QHS  . insulin aspart  0-9 Units Subcutaneous TID WC  . insulin glargine  16 Units Subcutaneous QHS  . multivitamin-lutein  1 capsule Oral Daily  . pantoprazole  40 mg Oral Daily  . patiromer  16.8 g Oral Daily  . simvastatin  20 mg Oral Daily     Assessment/ Plan:  [name] is a 78 y.o. white male with chronic kidney disease stage IV followed by [facility], hypertension, diabetes mellitus type II, congestive heart failure, coronary artery disease status post CABG, hyperlipidemia, AAA, peripheral vascular disease, renal artery disease, gout, peptic ulcer disease/GERD, macular degeneration , who was admitted to [facility] on 12/07/2018 for altered mental status   1. Acute renal failure with hyperkalemia, hyponatremia and metabolic acidosis on chronic kidney disease stage III with proteinuria: unknown baseline creatinine. Follows with [facility], last seen in 11/2017. Baseline  Creatinine was 1.48, GFR of 44 on 09/15/2018.  Chronic kidney disease secondary to diabetic nephropathy.  Acute renal failure source is unclear. Concern for obstructive uropathy, progression of chronic kidney disease or prerenal azotemia.  Encephalopathy can be explained by uremia but full work up is necessary Urinalysis with glucosuria. Ultrasound with small kidneys Sodium has improved.  - No indication for dialysis.  - holding benazepril - Continue sodium bicarbonate infusion - continue patiromer due to hyperkalemia - Get baseline labs from PCP.   2. Hypertension: blood pressure at goal. Echocardiogram with impaired relaxation. Home regimen of metoprolol and benazepril. Holding medications.   3. Diabetes mellitus type II with chronic kidney disease: noninsulin dependent. Hemoglobin A1c of 6.8%.      LOS: 2 Robert Conrad 8/6/20202:30 PM

## 2018-12-09 NOTE — Progress Notes (Signed)
Patient remains off unit for testing. Will resume care upon return. Wenda Low [facility]

## 2018-12-09 NOTE — Progress Notes (Signed)
PT Cancellation Note  Patient Details Name: Robert Conrad MRN: [id number] DOB: August 14, 1940   Cancelled Treatment:    Reason Eval/Treat Not Completed: Medical issues which prohibited therapy(Consult received and chart reviewed.  Patient noted with critically elevated potassium levels (5.3), trending down and pending re-check around 1300.  Will continue to follow and initiate as medically appropriate.)   Jarrod Bodkins H. Owens Shark, PT, DPT, NCS 12/09/18, 11:11 AM [phone]

## 2018-12-09 NOTE — Care Management Important Message (Signed)
Important Message  Patient Details  Name: Robert Conrad MRN: [id number] Date of Birth: Nov 06, 1940   Medicare Important Message Given:  Yes  Initial Medicare IM given by Patient Access Associate on 12/08/2018 at 11:35am.  Still valid.    Dannette Barbara 12/09/2018, 12:03 PM

## 2018-12-09 NOTE — Progress Notes (Signed)
I just witnessed the patient pour his entire urinal down the sink, after being reminded not to get up on his own. Patient just laughed and shook his head. Wenda Low [facility]

## 2018-12-09 NOTE — Plan of Care (Signed)
  Problem: Education: Goal: Knowledge of General Education information will improve Description: Including pain rating scale, medication(s)/side effects and non-pharmacologic comfort measures Outcome: Not Progressing Note: Patient still acutely confused today. Dumped his entire urinal down the room sink, then proceeded to wash it out in the sink w/ bare hands. Wenda Low [facility]

## 2018-12-09 NOTE — Progress Notes (Signed)
[place] [facility] [place] NAME: Robert Conrad    MR#:  [id number]  DATE OF BIRTH:  January 16, 1941  SUBJECTIVE:  CHIEF COMPLAINT:   Chief Complaint  Patient presents with  . Altered Mental Status  . Hallucinations   Came with confusion, noted to have acute renal failure and hyperkalemia.  Clinically improving.  Mental status seem to be almost at his baseline .wife was present in the room.  On 2 L of oxygen  REVIEW OF SYSTEMS:  CONSTITUTIONAL: No fever, fatigue or weakness.  EYES: No blurred or double vision.  EARS, NOSE, AND THROAT: No tinnitus or ear pain.  RESPIRATORY: No cough, shortness of breath, wheezing or hemoptysis.  CARDIOVASCULAR: No chest pain, orthopnea, edema.  GASTROINTESTINAL: No nausea, vomiting, diarrhea or abdominal pain.  GENITOURINARY: No dysuria, hematuria.  ENDOCRINE: No polyuria, nocturia,  HEMATOLOGY: No anemia, easy bruising or bleeding SKIN: No rash or lesion. MUSCULOSKELETAL: No joint pain or arthritis.   NEUROLOGIC: No tingling, numbness, weakness.  PSYCHIATRY: No anxiety or depression.   ROS  DRUG ALLERGIES:   Allergies  Allergen Reactions  . Ciprofloxacin Hcl Rash    VITALS:  Blood pressure 137/80, pulse 98, temperature 98.2 F (36.8 C), resp. rate 20, height 5\' 9"  (1.753 m), weight 85 kg, SpO2 100 %.  PHYSICAL EXAMINATION:  GENERAL:  78 y.o.-year-old patient lying in the bed with no acute distress.  EYES: Pupils equal, round, reactive to light and accommodation. No scleral icterus. Extraocular muscles intact.  HEENT: Head atraumatic, normocephalic. Oropharynx and nasopharynx clear. Very hard of hearing. NECK:  Supple, no jugular venous distention. No thyroid enlargement, no tenderness.  LUNGS: Normal breath sounds bilaterally, no wheezing, rales,rhonchi or crepitation. No use of accessory muscles of respiration.  CARDIOVASCULAR: S1, S2 normal. No murmurs, rubs, or gallops.  ABDOMEN: Soft, nontender,  nondistended. Bowel sounds present. No organomegaly or mass.  EXTREMITIES: No pedal edema, cyanosis, or clubbing.  NEUROLOGIC: Cranial nerves II through XII are intact. Muscle strength 4/5 in all extremities. Sensation intact. Gait not checked.  PSYCHIATRIC: The patient is alert and oriented x 2-3.  SKIN: No obvious rash, lesion, or ulcer.   Physical Exam LABORATORY PANEL:   CBC Recent Labs  Lab 12/08/18 0544  WBC 9.0  HGB 12.9*  HCT 40.4  PLT 182   ------------------------------------------------------------------------------------------------------------------  Chemistries  Recent Labs  Lab 12/07/18 1235  12/08/18 0544  12/09/18 0930  NA 134*   < > 139  --   --   K 6.6*   < > 5.8*   < > 5.3*  CL 111   < > 110  --   --   CO2 17*   < > 21*  --   --   GLUCOSE 145*   < > 151*  --   --   BUN 48*   < > 42*  --   --   CREATININE 3.14*   < > 2.71*  --   --   CALCIUM 8.7*   < > 9.1  --   --   AST 14*  --   --   --   --   ALT 16  --   --   --   --   ALKPHOS 41  --   --   --   --   BILITOT 0.7  --   --   --   --    < > = values in this interval not displayed.   ------------------------------------------------------------------------------------------------------------------  Cardiac Enzymes No results for input(s): TROPONINI in the last 168 hours. ------------------------------------------------------------------------------------------------------------------  RADIOLOGY:  Dg Chest 2 View  Result Date: 12/07/2018 CLINICAL DATA:  78 year old male with confusion, hallucinations since yesterday. Found down at 0400 hours. EXAM: CHEST - 2 VIEW COMPARISON:  04/22/2018 chest radiographs and earlier. FINDINGS: Lung volumes and mediastinal contours remain within normal limits. Prior CABG. The lungs appear stable in clear. No pneumothorax or pleural effusion. No acute osseous abnormality identified. IMPRESSION: No acute cardiopulmonary abnormality. Electronically Signed   By: Genevie Ann  M.D.   On: 12/07/2018 14:52   US Renal  Result Date: 12/08/2018 CLINICAL DATA:  Hyperkalemia.  Renal artery stenosis. EXAM: RENAL / URINARY TRACT ULTRASOUND COMPLETE COMPARISON:  CT 01/01/2015 FINDINGS: Right Kidney: Renal measurements: 9.1 x 5.0 x 5.5 cm = volume: 130 mL . Echogenicity within normal limits. No mass or hydronephrosis visualized. Left Kidney: Renal measurements: 10.5 x 4.6 x 3.7 cm = volume: 92 mL. Echogenicity within normal limits. No mass or hydronephrosis visualized. Insignificant 1 cm cyst at the upper pole. Bladder: Appears normal for degree of bladder distention. IMPRESSION: The kidneys are small but there is no acute finding. No sign of hydronephrosis or focal lesion. Left kidney smaller than the right. Electronically Signed   By: Nelson Chimes M.D.   On: 12/08/2018 09:20   US Carotid Bilateral  Result Date: 12/09/2018 CLINICAL DATA:  Syncope, previous stroke. Coronary artery disease, hyperlipidemia, diabetes. EXAM: BILATERAL CAROTID DUPLEX ULTRASOUND TECHNIQUE: Pearline Cables scale imaging, color Doppler and duplex ultrasound were performed of bilateral carotid and vertebral arteries in the neck. COMPARISON:  03/05/2010 FINDINGS: Criteria: Quantification of carotid stenosis is based on velocity parameters that correlate the residual internal carotid diameter with NASCET-based stenosis levels, using the diameter of the distal internal carotid lumen as the denominator for stenosis measurement. The following velocity measurements were obtained: RIGHT ICA: 37/4 cm/sec CCA: 74/9 cm/sec SYSTOLIC ICA/CCA RATIO:  0.5 ECA: 72 cm/sec LEFT ICA: 93/30 cm/sec CCA: 44/96 cm/sec SYSTOLIC ICA/CCA RATIO:  1.3 ECA: 70 cm/sec RIGHT CAROTID ARTERY: Intimal thickening in the common carotid artery. Partially calcified plaque in the distal common carotid artery, bulb, proximal ECA and ICA. High resistance common carotid waveform. Apparent ICA occlusion shortly beyond its origin, as before. No string sign on power  Doppler. External carotid patent. RIGHT VERTEBRAL ARTERY:  Normal flow direction and waveform. LEFT CAROTID ARTERY: Intimal thickening through the common carotid artery. Calcified plaque in the bulb, proximal ECA and ICA. Normal waveforms and color Doppler signal. LEFT VERTEBRAL ARTERY:  Normal flow direction and waveform. IMPRESSION: 1. Chronic  right ICA occlusion. 2. Left carotid bifurcation plaque resulting in less than 50% diameter stenosis. 3.  Antegrade bilateral vertebral arterial flow. Electronically Signed   By: Lucrezia Europe M.D.   On: 12/09/2018 10:05    ASSESSMENT AND PLAN:   Active Problems:   Altered mental status  78 y.o. male with past medical history of abdominal aneurysm, diabetes mellitus, GERD, hyperlipidemia, hypertension, right MCA stroke, CKD, right renal artery stenosis and CAD presenting to the ED with altered mental status.  1. Altered mental status - due to metabolic encephalopathy from acute kidney injury on chronic kidney disease  Patient found on the floor per wife unclear if he had syncopal episode. -Clinically improving - CT head reviewed and shows no acute intracranial abnormality - UA negative for UTI - Carotid ultrasound done -chronic right ICA occlusion.  Left carotid bifurcation with less than 50% stenosis.  No interventions needed at  this time - Echocardiogram- EF 60%, Impaired relaxation, no major valvular abnormalities. -  TSH- 1.09 and vitamin B12- 71- will give Vit b12 IM while in the hospital and need oral replacement on discharge.  2. Acute kidney injury on CKD 3 -BUN/creatinine elevated  - hx of right renal artery stenosis - Patient report he has been taking Aleve frequently - Hold nephrotoxins - Gentle IVFs - Hold NSAIDs and ACE inhibitors - renal ultrasound per nephrology- no hydronephrosis. - Nephrology consult appreciated, discussed with Dr. Candiss Norse.  Continue bicarb drip and close monitor. Pt's baseline creatinin is around 1.5 per nephro.   Creatinine during this hospital course 3.14-2.74-2.71  3. Hyperkalemia -likely due to AKI - Received IV calcium gluconate, insulin and dextrose in the ED - Check serial potassium - started on veltassa by nephro  4. DM: His sugars have been well-controlled on current regimen  -  Hbg A1c 6.8 - Hold metformin in house - Continue home Insulin glargine at half dose - Low intensity sliding scale insulin coverage - FS BS qac and qhs   5. HLD  + Goal LDL<100 - Simvastatin 20mg  PO qhs  6. HTN  + Goal BP <130/80 - Continue metoprolol - Hold benazepril in the setting of AKI  7. History of right MCA strokes - ASA 81mg  PO daily  8.  Generalized weakness physical therapy assessment  All the records are reviewed and case discussed with Care Management/Social Workerr. Management plans discussed with the patient, wife at bedside and they are in agreement.  CODE STATUS: Full.  TOTAL TIME TAKING CARE OF THIS PATIENT: 35 minutes.   Spoke to his wife in room today.  POSSIBLE D/C IN 1-2 DAYS, DEPENDING ON CLINICAL CONDITION.   Nicholes Mango M.D on 12/09/2018   Between 7am to 6pm - Pager - [phone]  After 6pm go to [email or web address] - password [email or web address] [facility]  Office  [phone]  CC: Primary care physician; Albina Billet, MD  Note: This dictation was prepared with Dragon dictation along with smaller phrase technology. Any transcriptional errors that result from this process are unintentional.

## 2018-12-10 LAB — BASIC METABOLIC PANEL
Anion gap: 8 (ref 5–15)
BUN: 28 mg/dL — ABNORMAL HIGH (ref 8–23)
CO2: 32 mmol/L (ref 22–32)
Calcium: 9.3 mg/dL (ref 8.9–10.3)
Chloride: 99 mmol/L (ref 98–111)
Creatinine, Ser: 2.23 mg/dL — ABNORMAL HIGH (ref 0.61–1.24)
GFR calc Af Amer: 31 mL/min — ABNORMAL LOW (ref >60)
GFR calc non Af Amer: 27 mL/min — ABNORMAL LOW (ref >60)
Glucose, Bld: 121 mg/dL — ABNORMAL HIGH (ref 70–99)
Potassium: 4.7 mmol/L (ref 3.5–5.1)
Sodium: 139 mmol/L (ref 135–145)

## 2018-12-10 LAB — GLUCOSE, CAPILLARY
Glucose-Capillary: 111 mg/dL — ABNORMAL HIGH (ref 70–99)
Glucose-Capillary: 162 mg/dL — ABNORMAL HIGH (ref 70–99)

## 2018-12-10 LAB — CBC
HCT: 39.3 % (ref 39.0–52.0)
Hemoglobin: 12.7 g/dL — ABNORMAL LOW (ref 13.0–17.0)
MCH: 28.9 pg (ref 26.0–34.0)
MCHC: 32.3 g/dL (ref 30.0–36.0)
MCV: 89.3 fL (ref 80.0–100.0)
Platelets: 176 10*3/uL (ref 150–400)
RBC: 4.4 MIL/uL (ref 4.22–5.81)
RDW: 13.6 % (ref 11.5–15.5)
WBC: 8.3 10*3/uL (ref 4.0–10.5)
nRBC: 0 % (ref 0.0–0.2)

## 2018-12-10 LAB — MAGNESIUM: Magnesium: 1.5 mg/dL — ABNORMAL LOW (ref 1.7–2.4)

## 2018-12-10 MED ORDER — PATIROMER SORBITEX CALCIUM 8.4 G PO PACK: 16.8000 g | PACK | Freq: Every day | ORAL | 0 refills | Status: DC

## 2018-12-10 MED ORDER — TOUJEO SOLOSTAR 300 UNIT/ML ~~LOC~~ SOPN: 18.0000 [IU] | PEN_INJECTOR | Freq: Every day | SUBCUTANEOUS | 3 refills | Status: DC

## 2018-12-10 MED ORDER — SODIUM CHLORIDE 0.9 % IV SOLN
INTRAVENOUS | Status: DC | PRN
  Administered 2018-12-10: 500 mL via INTRAVENOUS

## 2018-12-10 MED ORDER — MAGNESIUM SULFATE 2 GM/50ML IV SOLN
2.0000 g | Freq: Once | INTRAVENOUS | Status: AC
  Administered 2018-12-10: 2 g via INTRAVENOUS
  Filled 2018-12-10: qty 50

## 2018-12-10 MED ORDER — TOUJEO SOLOSTAR 300 UNIT/ML ~~LOC~~ SOPN: 18.0000 [IU] | PEN_INJECTOR | Freq: Every day | SUBCUTANEOUS | 0 refills | Status: DC

## 2018-12-10 MED ORDER — VITAMIN B-12 1000 MCG PO TABS: 1000.0000 ug | ORAL_TABLET | Freq: Every day | ORAL | 0 refills | Status: DC

## 2018-12-10 NOTE — Progress Notes (Signed)
[facility]  ROUNDING NOTE   Subjective:   Wife at bedside.  Patient is hard of hearing  Doing better today No LE edema 08/06 0701 - 08/07 0700 In: 480 [P.O.:480] Out: 300 [Urine:300]    Objective:  Vital signs in last 24 hours:  Temp:  [97.6 F (36.4 C)-98 F (36.7 C)] 97.6 F (36.4 C) (08/07 0738) Pulse Rate:  [49-99] 82 (08/07 0738) Resp:  [19-20] 19 (08/07 0738) BP: (86-142)/(35-81) 142/78 (08/07 0738) SpO2:  [94 %-100 %] 94 % (08/07 1109) Weight:  [86.5 kg] 86.5 kg (08/07 0100)  Weight change:  Filed Weights   12/07/18 1006 12/07/18 1534 12/10/18 0100  Weight: 86 kg 85 kg 86.5 kg    Intake/Output: I/O last 3 completed shifts: In: 1565.2 [P.O.:480; I.V.:1085.2] Out: 500 [Urine:500]   Intake/Output this shift:  Total I/O In: 240 [P.O.:240] Out: 300 [Urine:300]  Physical Exam: General: NAD, laying in bed  Head: Normocephalic, atraumatic. Moist oral mucosal membranes  Eyes: Anicteric,   Neck: Supple, trachea midline  Lungs:  Clear to auscultation  Heart: Irregular   Abdomen:  Soft, nontender,   Extremities:  no peripheral edema.  Neurologic: Alert oriented, able to answer questions  Skin: No lesions        Basic Metabolic Panel: Recent Labs  Lab 12/07/18 1021 12/07/18 1235 12/07/18 1408  12/08/18 0040  12/08/18 0544  12/08/18 2028 12/09/18 0005 12/09/18 0417 12/09/18 0930 12/10/18 0539  NA 134* 134*  --   --  137  --  139  --   --   --   --   --  139  K 7.0* 6.6* 6.2*   < > 6.5*   < > 5.8*   < > 5.8* 5.9* 5.6* 5.3* 4.7  CL 110 111  --   --  112*  --  110  --   --   --   --   --  99  CO2 17* 17*  --   --  19*  --  21*  --   --   --   --   --  32  GLUCOSE 163* 145* 119*  --  138*  --  151*  --   --   --   --   --  121*  BUN 48* 48*  --   --  44*  --  42*  --   --   --   --   --  28*  CREATININE 3.14* 3.14*  --   --  2.74*  --  2.71*  --   --   --   --   --  2.23*  CALCIUM 9.2 8.7*  --   --  8.9  --  9.1  --   --   --   --   --   9.3  MG  --   --   --   --   --   --   --   --   --   --   --   --  1.5*   < > = values in this interval not displayed.    Liver Function Tests: Recent Labs  Lab 12/07/18 1021 12/07/18 1235  AST 14* 14*  ALT 17 16  ALKPHOS 46 41  BILITOT 0.8 0.7  PROT 7.2 6.5  ALBUMIN 4.2 3.7   No results for input(s): LIPASE, AMYLASE in the last 168 hours. No results for input(s): AMMONIA in the last 168 hours.  CBC: Recent Labs  Lab 12/07/18 1021 12/08/18 0544 12/10/18 0539  WBC 11.6* 9.0 8.3  HGB 13.8 12.9* 12.7*  HCT 43.4 40.4 39.3  MCV 89.9 88.8 89.3  PLT 218 182 176    Cardiac Enzymes: No results for input(s): CKTOTAL, CKMB, CKMBINDEX, TROPONINI in the last 168 hours.  BNP: Invalid input(s): POCBNP  CBG: Recent Labs  Lab 12/09/18 0735 12/09/18 1143 12/09/18 1705 12/09/18 2134 12/10/18 0740  GLUCAP 107* 164* 108* 144* 111*    Microbiology: Results for orders placed or performed during the hospital encounter of 12/07/18  SARS CORONAVIRUS 2 Nasal Swab Aptima Multi Swab     Status: None   Collection Time: 12/07/18  2:34 PM   Specimen: Aptima Multi Swab; Nasal Swab  Result Value Ref Range Status   SARS Coronavirus 2 NEGATIVE NEGATIVE Final    Comment: (NOTE) SARS-CoV-2 target nucleic acids are NOT DETECTED. The SARS-CoV-2 RNA is generally detectable in upper and lower respiratory specimens during the acute phase of infection. Negative results do not preclude SARS-CoV-2 infection, do not rule out co-infections with other pathogens, and should not be used as the sole basis for treatment or other patient management decisions. Negative results must be combined with clinical observations, patient history, and epidemiological information. The expected result is Negative. Fact Sheet for Patients: SugarRoll.be Fact Sheet for Healthcare Providers: https://www.woods-mathews.com/ This test is not yet approved or cleared by the [place] FDA and  has been authorized for detection and/or diagnosis of SARS-CoV-2 by FDA under an Emergency Use Authorization (EUA). This EUA will remain  in effect (meaning this test can be used) for the duration of the COVID-19 declaration under Section 56 4(b)(1) of the Act, 21 U.S.C. section 360bbb-3(b)(1), unless the authorization is terminated or revoked sooner. Performed at [facility], [place] [address], [place], [place] [address]     Coagulation Studies: No results for input(s): LABPROT, INR in the last 72 hours.  Urinalysis: No results for input(s): COLORURINE, LABSPEC, PHURINE, GLUCOSEU, HGBUR, BILIRUBINUR, KETONESUR, PROTEINUR, UROBILINOGEN, NITRITE, LEUKOCYTESUR in the last 72 hours.  Invalid input(s): APPERANCEUR    Imaging: US Carotid Bilateral  Result Date: 12/09/2018 CLINICAL DATA:  Syncope, previous stroke. Coronary artery disease, hyperlipidemia, diabetes. EXAM: BILATERAL CAROTID DUPLEX ULTRASOUND TECHNIQUE: Pearline Cables scale imaging, color Doppler and duplex ultrasound were performed of bilateral carotid and vertebral arteries in the neck. COMPARISON:  03/05/2010 FINDINGS: Criteria: Quantification of carotid stenosis is based on velocity parameters that correlate the residual internal carotid diameter with NASCET-based stenosis levels, using the diameter of the distal internal carotid lumen as the denominator for stenosis measurement. The following velocity measurements were obtained: RIGHT ICA: 37/4 cm/sec CCA: 87/8 cm/sec SYSTOLIC ICA/CCA RATIO:  0.5 ECA: 72 cm/sec LEFT ICA: 93/30 cm/sec CCA: 67/67 cm/sec SYSTOLIC ICA/CCA RATIO:  1.3 ECA: 70 cm/sec RIGHT CAROTID ARTERY: Intimal thickening in the common carotid artery. Partially calcified plaque in the distal common carotid artery, bulb, proximal ECA and ICA. High resistance common carotid waveform. Apparent ICA occlusion shortly beyond its origin, as before. No string sign on power Doppler. External carotid patent. RIGHT  VERTEBRAL ARTERY:  Normal flow direction and waveform. LEFT CAROTID ARTERY: Intimal thickening through the common carotid artery. Calcified plaque in the bulb, proximal ECA and ICA. Normal waveforms and color Doppler signal. LEFT VERTEBRAL ARTERY:  Normal flow direction and waveform. IMPRESSION: 1. Chronic  right ICA occlusion. 2. Left carotid bifurcation plaque resulting in less than 50% diameter stenosis. 3.  Antegrade bilateral vertebral arterial  flow. Electronically Signed   By: Lucrezia Europe M.D.   On: 12/09/2018 10:05     Medications:   . sodium chloride Stopped (12/10/18 1144)  .  sodium bicarbonate  infusion 1000 mL 50 mL/hr at 12/10/18 1147   . aspirin EC  81 mg Oral Daily  . heparin  5,000 Units Subcutaneous Q8H  . insulin aspart  0-5 Units Subcutaneous QHS  . insulin aspart  0-9 Units Subcutaneous TID WC  . insulin glargine  16 Units Subcutaneous QHS  . multivitamin-lutein  1 capsule Oral Daily  . pantoprazole  40 mg Oral Daily  . patiromer  16.8 g Oral Daily  . simvastatin  20 mg Oral Daily     Assessment/ Plan:  [name] is a 78 y.o. white male with chronic kidney disease stage IV followed by [facility], hypertension, diabetes mellitus type II, congestive heart failure, coronary artery disease status post CABG, hyperlipidemia, AAA, peripheral vascular disease, renal artery disease, gout, peptic ulcer disease/GERD, macular degeneration , who was admitted to [facility] on 12/07/2018 for altered mental status   1. Acute renal failure with hyperkalemia, hyponatremia and metabolic acidosis on chronic kidney disease stage III with proteinuria: unknown baseline creatinine. Follows with [facility], last seen in 11/2017. Baseline  Creatinine was 1.48, GFR of 44 on 09/15/2018.  Chronic kidney disease secondary to diabetic nephropathy.  Acute renal failure likely due to cardiorenal instability Encephalopathy can be explained by uremia but full work up is necessary Urinalysis  with glucosuria. Ultrasound with small kidneys Sodium has improved.  - No indication for dialysis.  - holding benazepril - Continue sodium bicarbonate infusion - continue patiromer due to hyperkalemia - Get baseline labs from PCP.   2. Hypertension: blood pressure at goal. Echocardiogram with impaired relaxation. Home regimen of metoprolol and benazepril. Holding medications.   3. Diabetes mellitus type II with chronic kidney disease: noninsulin dependent. Robert Conrad Lab Results  Component Value Date   HGBA1C 6.8 (H) 12/07/2018       LOS: 3 Robert Conrad 8/7/202011:49 AM

## 2018-12-10 NOTE — TOC Transition Note (Addendum)
Transition of Care [facility]) - CM/SW Discharge Note   Patient Details  Name: Kodee Ravert Peppers MRN: [id number] Date of Birth: 1940-12-01  [facility] CM/SW Contact:  Katrina Stack, RN Phone Number: 12/10/2018, 2:55 PM   Clinical Narrative:     Patient for discharge home today.  He and his wife are in agreement with home health nurse and physical therapy.  Wife says patient has been having balance issues and a few stubbles at home. No agency preference.  Referral for RN and PT called to and accepted by [facility].  Patient has a walker in the home.  No issues accessing medical care, with transportation or paying for meds.         Patient Goals and CMS Choice        Discharge Placement                       Discharge Plan and Services                                     Social Determinants of Health (SDOH) Interventions     Readmission Risk Interventions No flowsheet data found.

## 2018-12-10 NOTE — Discharge Summary (Signed)
[facility] NAME: Robert Conrad    MR#:  [id number]  DATE OF BIRTH:  1940/11/03  DATE OF ADMISSION:  12/07/2018 ADMITTING PHYSICIAN: Lang Snow, NP  DATE OF DISCHARGE:  12/10/18  PRIMARY CARE PHYSICIAN: Albina Billet, MD    ADMISSION DIAGNOSIS:  Hyperkalemia [E87.5] Syncope [R55] SOB (shortness of breath) [R06.02] Renal artery stenosis (HCC) [I70.1]  DISCHARGE DIAGNOSIS:  Hyperkalemia Acute kidney injury Renal artery stenosis Syncope  SECONDARY DIAGNOSIS:   Past Medical History:  Diagnosis Date  . 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 Arthritis   . Bowel trouble 1999  . Diabetes mellitus without complication ([place]) 9892   non insulin dependent  . Diverticulitis   . Eye problems 2010  . GERD (gastroesophageal reflux disease) 2009  . Gout 2009  . Hearing loss   . Heart disease 1990  . Hyperlipidemia 2009  . Hypertension 2009  . Obesity, unspecified   . Personal history of colonic polyps   . Personal history of tobacco use, presenting hazards to health   . Renal insufficiency   . Special screening for malignant neoplasms, colon   . Stroke (cerebrum) ([place])   . Stroke [facility]) 2009  . Ulcer 2009   stomach    HOSPITAL COURSE:   HPI  78 year old male with past medical history of abdominal aneurysm, diabetes mellitus, GERD, hyperlipidemia, hypertension, right MCA stroke, CKD, right renal artery stenosis and CAD presenting to the ED with altered mental status.  Patient is very hard of hearing so history mostly obtained from patient's wife who is currently at the bedside.  Per patient's wife, patient's been acting confused and hallucinating since yesterday.  She found him on the floor this morning at around 4 AM unsure if he fell or was just lying down.  Patient's wife states that he has been urinating a lot for the past 3 days and unsteady on his feet.  Patient also complains of worsening vision  due to history of macular degeneration, right ear fullness and pressure also complained of shortness of breath and nonproductive cough over the last few days.  Denies fevers or chills, nausea or vomiting, chest pain or other associated symptoms.   On arrival to the ED, he was afebrile with blood pressure 144/50 mm Hg and pulse rate 79 beats/min. There were no focal neurological deficits; he was alert and oriented x3 but very hard of hearing.  Initial labs revealed sodium of 134, potassium 7.0, BUN 48, creatinine 3.14, WBC 11.6, UA negative for UTI.  A noncontrast CT head was obtained and showed no acute intracranial abnormality.  Patient received sodium bicarb, IV calcium gluconate, insulin and dextrose for hyperkalemia.  Patient will be admitted under hospitalist service for further management.  1.Altered mental status- due to metabolic encephalopathy from acute kidney injury on chronic kidney disease  Patient found on the floor per wife unclear if he had syncopal episode. -Clinically improving -CT head reviewed and shows no acute intracranial abnormality -UA negative for UTI - Carotid ultrasound done -chronic right ICA occlusion.  Left carotid bifurcation with less than 50% stenosis.  No interventions needed at this time - Echocardiogram- EF 60%, Impaired relaxation, no major valvular abnormalities. -  TSH- 1.09 and vitamin B12- 71- will give Vit b12 IM while in the hospital and need oral replacement on discharge.  2.Acute kidney injury on CKD 3-BUN/creatinine elevated - hx of right renal artery stenosis - Patient report he  has been taking Aleve frequently -Hold nephrotoxins -GentleIVFs provided during the hospital course -Hold NSAIDs and ACE inhibitors - renal ultrasound pernephrology- no hydronephrosis. -Nephrology consult appreciated, discussed with Dr. Candiss Norse.  Continue bicarb drip and close monitor. Pt's baseline creatinin is around 1.5 per nephro.  Creatinine during this  hospital course 3.14-2.74-2.2.2.  Okay to discharge patient from nephrology standpoint and outpatient follow-up in a week is recommended  3.Hyperkalemia-likely due to AKI -Received IV calcium gluconate, insulin and dextrose in the ED - started on veltassa by nephro -Hyperkalemia resolved, discontinued Veltassa at the time of discharge  4.DM: His sugars have been well-controlled on current regimen -  Hbg A1c 6.8 -Hold metformin in house - Continue home Insulin glargine at 18 units -Low intensity sliding scale insulin coverage provided during the hospital course -FS BS qac and qhs  5.HLD  + Goal LDL<100 - Simvastatin20mg  PO qhs  6.HTN  + Goal BP <130/80 -Continue metoprolol -Hold benazepril in the setting of AKI  7. History of right MCA strokes - ASA 81mg  PO daily  8.  Generalized weakness physical therapy assessment-no PT needs identified will arrange home health PT RN  9.  B12 deficiency given B12 shots during the hospital course will continue B12   Discharge patient home DISCHARGE CONDITIONS:   Fair  CONSULTS OBTAINED:  Treatment Team:  Lavonia Dana, MD   PROCEDURES  None   DRUG ALLERGIES:   Allergies  Allergen Reactions  . Ciprofloxacin Hcl Rash    DISCHARGE MEDICATIONS:   Allergies as of 12/10/2018      Reactions   Ciprofloxacin Hcl Rash      Medication List    STOP taking these medications   benazepril 20 MG tablet Commonly known as: LOTENSIN   metFORMIN 500 MG 24 hr tablet Commonly known as: GLUCOPHAGE-XR     TAKE these medications   aspirin 81 MG tablet Take 81 mg by mouth daily.   metoprolol succinate 25 MG 24 hr tablet Commonly known as: TOPROL-XL Take 25 mg by mouth 2 (two) times daily.   omeprazole 20 MG capsule Commonly known as: PRILOSEC Take 20 mg by mouth daily.   PreserVision AREDS 2 Caps Take 1 capsule by mouth daily.   simvastatin 20 MG tablet Commonly known as: ZOCOR Take 20 mg by mouth daily.    Toujeo SoloStar 300 UNIT/ML Sopn Generic drug: Insulin Glargine (1 Unit Dial) Inject 18 Units into the skin daily. What changed: how much to take   vitamin B-12 1000 MCG tablet Commonly known as: CYANOCOBALAMIN Take 1 tablet (1,000 mcg total) by mouth daily.        DISCHARGE INSTRUCTIONS:   Follow-up with primary care physician in 3 days Follow-up with nephrology Dr. Candiss Norse in a week  DIET:  Cardiac diet and Diabetic diet  DISCHARGE CONDITION:  Fair  ACTIVITY:  Activity as tolerated  OXYGEN:  Home Oxygen: No.   Oxygen Delivery: room air  DISCHARGE LOCATION:  home   If you experience worsening of your admission symptoms, develop shortness of breath, life threatening emergency, suicidal or homicidal thoughts you must seek medical attention immediately by calling 911 or calling your MD immediately  if symptoms less severe.  You Must read complete instructions/literature along with all the possible adverse reactions/side effects for all the Medicines you take and that have been prescribed to you. Take any new Medicines after you have completely understood and accpet all the possible adverse reactions/side effects.   Please note  You were cared for  by a hospitalist during your hospital stay. If you have any questions about your discharge medications or the care you received while you were in the hospital after you are discharged, you can call the unit and asked to speak with the hospitalist on call if the hospitalist that took care of you is not available. Once you are discharged, your primary care physician will handle any further medical issues. Please note that NO REFILLS for any discharge medications will be authorized once you are discharged, as it is imperative that you return to your primary care physician (or establish a relationship with a primary care physician if you do not have one) for your aftercare needs so that they can reassess your need for medications and  monitor your lab values.     Today  Chief Complaint  Patient presents with  . Altered Mental Status  . Hallucinations   Pt is feeling fine.  Wants to go home.  Okay to discharge patient from nephrology standpoint  ROS:  CONSTITUTIONAL: Denies fevers, chills. Denies any fatigue, weakness.  EYES: Denies blurry vision, double vision, eye pain. EARS, NOSE, THROAT: Denies tinnitus, ear pain, hearing loss. RESPIRATORY: Denies cough, wheeze, shortness of breath.  CARDIOVASCULAR: Denies chest pain, palpitations, edema.  GASTROINTESTINAL: Denies nausea, vomiting, diarrhea, abdominal pain. Denies bright red blood per rectum. GENITOURINARY: Denies dysuria, hematuria. ENDOCRINE: Denies nocturia or thyroid problems. HEMATOLOGIC AND LYMPHATIC: Denies easy bruising or bleeding. SKIN: Denies rash or lesion. MUSCULOSKELETAL: Denies pain in neck, back, shoulder, knees, hips or arthritic symptoms.  NEUROLOGIC: Denies paralysis, paresthesias.  PSYCHIATRIC: Denies anxiety or depressive symptoms.   VITAL SIGNS:  Blood pressure (!) 142/78, pulse 82, temperature 97.6 F (36.4 C), temperature source Oral, resp. rate 19, height 5\' 9"  (1.753 m), weight 86.5 kg, SpO2 94 %.  I/O:    Intake/Output Summary (Last 24 hours) at 12/10/2018 1431 Last data filed at 12/10/2018 1302 Gross per 24 hour  Intake 240 ml  Output 625 ml  Net -385 ml    PHYSICAL EXAMINATION:  GENERAL:  78 y.o.-year-old patient lying in the bed with no acute distress.  EYES: Pupils equal, round, reactive to light and accommodation. No scleral icterus. Extraocular muscles intact.  HEENT: Head atraumatic, normocephalic. Oropharynx and nasopharynx clear.  NECK:  Supple, no jugular venous distention. No thyroid enlargement, no tenderness.  LUNGS: Normal breath sounds bilaterally, no wheezing, rales,rhonchi or crepitation. No use of accessory muscles of respiration.  CARDIOVASCULAR: S1, S2 normal. No murmurs, rubs, or gallops.  ABDOMEN:  Soft, non-tender, non-distended. Bowel sounds present.  EXTREMITIES: No pedal edema, cyanosis, or clubbing.  NEUROLOGIC: Cranial nerves II through XII are intact. Muscle strength at his baseline in all extremities. Sensation intact. Gait not checked.  PSYCHIATRIC: The patient is alert and oriented x 3.  SKIN: No obvious rash, lesion, or ulcer.   DATA REVIEW:   CBC Recent Labs  Lab 12/10/18 0539  WBC 8.3  HGB 12.7*  HCT 39.3  PLT 176    Chemistries  Recent Labs  Lab 12/07/18 1235  12/10/18 0539  NA 134*   < > 139  K 6.6*   < > 4.7  CL 111   < > 99  CO2 17*   < > 32  GLUCOSE 145*   < > 121*  BUN 48*   < > 28*  CREATININE 3.14*   < > 2.23*  CALCIUM 8.7*   < > 9.3  MG  --   --  1.5*  AST 14*  --   --  ALT 16  --   --   ALKPHOS 41  --   --   BILITOT 0.7  --   --    < > = values in this interval not displayed.    Cardiac Enzymes No results for input(s): TROPONINI in the last 168 hours.  Microbiology Results  Results for orders placed or performed during the hospital encounter of 12/07/18  SARS CORONAVIRUS 2 Nasal Swab Aptima Multi Swab     Status: None   Collection Time: 12/07/18  2:34 PM   Specimen: Aptima Multi Swab; Nasal Swab  Result Value Ref Range Status   SARS Coronavirus 2 NEGATIVE NEGATIVE Final    Comment: (NOTE) SARS-CoV-2 target nucleic acids are NOT DETECTED. The SARS-CoV-2 RNA is generally detectable in upper and lower respiratory specimens during the acute phase of infection. Negative results do not preclude SARS-CoV-2 infection, do not rule out co-infections with other pathogens, and should not be used as the sole basis for treatment or other patient management decisions. Negative results must be combined with clinical observations, patient history, and epidemiological information. The expected result is Negative. Fact Sheet for Patients: SugarRoll.be Fact Sheet for Healthcare  Providers: https://www.woods-mathews.com/ This test is not yet approved or cleared by the [place] FDA and  has been authorized for detection and/or diagnosis of SARS-CoV-2 by FDA under an Emergency Use Authorization (EUA). This EUA will remain  in effect (meaning this test can be used) for the duration of the COVID-19 declaration under Section 56 4(b)(1) of the Act, 21 U.S.C. section 360bbb-3(b)(1), unless the authorization is terminated or revoked sooner. Performed at [facility], [place] [address], [place], [place] [address]     RADIOLOGY:  Dg Chest 2 View  Result Date: 12/07/2018 CLINICAL DATA:  78 year old male with confusion, hallucinations since yesterday. Found down at 0400 hours. EXAM: CHEST - 2 VIEW COMPARISON:  04/22/2018 chest radiographs and earlier. FINDINGS: Lung volumes and mediastinal contours remain within normal limits. Prior CABG. The lungs appear stable in clear. No pneumothorax or pleural effusion. No acute osseous abnormality identified. IMPRESSION: No acute cardiopulmonary abnormality. Electronically Signed   By: Genevie Ann M.D.   On: 12/07/2018 14:52   Ct Head Wo Contrast  Result Date: 12/07/2018 CLINICAL DATA:  Altered mental status. EXAM: CT HEAD WITHOUT CONTRAST TECHNIQUE: Contiguous axial images were obtained from the base of the skull through the vertex without intravenous contrast. COMPARISON:  03/18/2013 FINDINGS: Brain: Again noted is encephalomalacia in the right temporoparietal region compatible with prior insult or infarct. Stable low-density in the right frontal periventricular white matter. Negative for acute hemorrhage, mass lesion, midline shift, hydrocephalus or new large infarct. Vascular: Atherosclerotic calcifications involving the vertebral arteries. Skull: Normal. Negative for fracture or focal lesion. Sinuses/Orbits: No acute finding. Other: None. IMPRESSION: 1. No acute intracranial abnormality. 2. 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 Electronically Signed   By: Markus Daft M.D.   On: 12/07/2018 11:08   US Renal  Result Date: 12/08/2018 CLINICAL DATA:  Hyperkalemia.  Renal artery stenosis. EXAM: RENAL / URINARY TRACT ULTRASOUND COMPLETE COMPARISON:  CT 01/01/2015 FINDINGS: Right Kidney: Renal measurements: 9.1 x 5.0 x 5.5 cm = volume: 130 mL . Echogenicity within normal limits. No mass or hydronephrosis visualized. Left Kidney: Renal measurements: 10.5 x 4.6 x 3.7 cm = volume: 92 mL. Echogenicity within normal limits. No mass or hydronephrosis visualized. Insignificant 1 cm cyst at the upper pole. Bladder: Appears normal for degree of bladder distention. IMPRESSION: The  kidneys are small but there is no acute finding. No sign of hydronephrosis or focal lesion. Left kidney smaller than the right. Electronically Signed   By: Nelson Chimes M.D.   On: 12/08/2018 09:20   US Carotid Bilateral  Result Date: 12/09/2018 CLINICAL DATA:  Syncope, previous stroke. Coronary artery disease, hyperlipidemia, diabetes. EXAM: BILATERAL CAROTID DUPLEX ULTRASOUND TECHNIQUE: Pearline Cables scale imaging, color Doppler and duplex ultrasound were performed of bilateral carotid and vertebral arteries in the neck. COMPARISON:  03/05/2010 FINDINGS: Criteria: Quantification of carotid stenosis is based on velocity parameters that correlate the residual internal carotid diameter with NASCET-based stenosis levels, using the diameter of the distal internal carotid lumen as the denominator for stenosis measurement. The following velocity measurements were obtained: RIGHT ICA: 37/4 cm/sec CCA: 25/8 cm/sec SYSTOLIC ICA/CCA RATIO:  0.5 ECA: 72 cm/sec LEFT ICA: 93/30 cm/sec CCA: 52/77 cm/sec SYSTOLIC ICA/CCA RATIO:  1.3 ECA: 70 cm/sec RIGHT CAROTID ARTERY: Intimal thickening in the common carotid artery. Partially calcified plaque in the distal common carotid artery, bulb, proximal ECA and ICA. High resistance common carotid waveform. Apparent ICA occlusion  shortly beyond its origin, as before. No string sign on power Doppler. External carotid patent. RIGHT VERTEBRAL ARTERY:  Normal flow direction and waveform. LEFT CAROTID ARTERY: Intimal thickening through the common carotid artery. Calcified plaque in the bulb, proximal ECA and ICA. Normal waveforms and color Doppler signal. LEFT VERTEBRAL ARTERY:  Normal flow direction and waveform. IMPRESSION: 1. Chronic  right ICA occlusion. 2. Left carotid bifurcation plaque resulting in less than 50% diameter stenosis. 3.  Antegrade bilateral vertebral arterial flow. Electronically Signed   By: Lucrezia Europe M.D.   On: 12/09/2018 10:05    EKG:   Orders placed or performed during the hospital encounter of 12/07/18  . EKG 12-Lead  . EKG 12-Lead  . EKG 12-Lead  . EKG 12-Lead      Management plans discussed with the patient, family and they are in agreement.  CODE STATUS:     Code Status Orders  (From admission, onward)         Start     Ordered   12/07/18 1327  Full code  Continuous     12/07/18 1332        Code Status History    This patient has a current code status but no historical code status.   Advance Care Planning Activity      TOTAL TIME TAKING CARE OF THIS PATIENT: 45  minutes.   Note: This dictation was prepared with Dragon dictation along with smaller phrase technology. Any transcriptional errors that result from this process are unintentional.   @M[facility] @  on 12/10/2018 at 2:31 PM  Between 7am to 6pm - Pager - [phone]  After 6pm go to [email or web address] - password [email or web address] [facility]  [facility]  Office  [phone]  CC: Primary care physician; Albina Billet, MD

## 2018-12-10 NOTE — Progress Notes (Signed)
Patient discharged to home with family. Tele and IV d/c'd prior to discharge. Verbalizes understanding of discharge instructions.

## 2018-12-10 NOTE — Evaluation (Signed)
Physical Therapy Evaluation Patient Details Name: Robert Conrad Kellen MRN: [id number] DOB: February 06, 1941 Today's Date: 12/10/2018   History of Present Illness  presented to ER secondary to AMS, hallucinations, found on floor by wife; admitted for management of AMS, likely related to metabolic abnormalities (now corrected)  Clinical Impression  Upon evaluation, patient alert and oriented to basic information; follows simple commands, generally HOH. Bilat UE/LE strength and ROM grossly symmetrical and WFL; no focal weakness, sensory or coordination deficit appreciated. Denies pain.  Able to complete bed mobility with mod indep; sit/stand, basic transfers and gait (400') without assist device, distant sup/mod indep. Demonstrates reciprocal stepping pattern with good step height/length, fair/good cadence; completes dynamic gait components without buckling, LOB or safety concern.  Reports gait performance/overall mobility is baseline for him; wife present, observing and verifies. Of note, maintains sats >93-94% at rest and with exertion on RA throughout session. No acute PT needs identified at this time.  Will complete initial order at this time; please re-consult should needs change.    Follow Up Recommendations No PT follow up    Equipment Recommendations       Recommendations for Other Services       Precautions / Restrictions Precautions Precautions: Fall Restrictions Weight Bearing Restrictions: No      Mobility  Bed Mobility Overal bed mobility: Modified Independent                Transfers Overall transfer level: Modified independent Equipment used: None                Ambulation/Gait Ambulation/Gait assistance: Supervision;Modified independent (Device/Increase time) Gait Distance (Feet): 400 Feet Assistive device: None       General Gait Details: reciprocal stepping pattern with good step height/length, fair/good cadence; completes dynamic gait components  without buckling, LOB or safety concern.  Reports gait performance/overall mobility is baseline for him; wife present, observing and verifies.  Stairs            Wheelchair Mobility    Modified Rankin (Stroke Patients Only)       Balance Overall balance assessment: Modified Independent                                           Pertinent Vitals/Pain Pain Assessment: No/denies pain    Home Living Family/patient expects to be discharged to:: Private residence Living Arrangements: Spouse/significant other   Type of Home: House Home Access: Stairs to enter   [employer] of Steps: 3 front, 5 back; bilat rails (close enough) at each Home Layout: One level Home Equipment: Walker - 2 wheels;Cane - single point      Prior Function Level of Independence: Independent         Comments: Indep with ADLs, household and community mobilization without assist device; no driving (due to visual difficulties); denies additional fall history     Hand Dominance        Extremity/Trunk Assessment   Upper Extremity Assessment Upper Extremity Assessment: Overall WFL for tasks assessed    Lower Extremity Assessment Lower Extremity Assessment: Overall WFL for tasks assessed(grossly 4 to 4+/5 throughout bilat LEs)       Communication   Communication: HOH  Cognition Arousal/Alertness: Awake/alert Behavior During Therapy: WFL for tasks assessed/performed Overall Cognitive Status: Within Functional Limits for tasks assessed  General Comments      Exercises     Assessment/Plan    PT Assessment Patent does not need any further PT services  PT Problem List         PT Treatment Interventions      PT Goals (Current goals can be found in the Care Plan section)  Acute Rehab PT Goals Patient Stated Goal: to go home! PT Goal Formulation: All assessment and education complete, DC therapy Time  For Goal Achievement: 12/10/18 Potential to Achieve Goals: Good    Frequency     Barriers to discharge        Co-evaluation               AM-PAC PT "6 Clicks" Mobility  Outcome Measure Help needed turning from your back to your side while in a flat bed without using bedrails?: None Help needed moving from lying on your back to sitting on the side of a flat bed without using bedrails?: None Help needed moving to and from a bed to a chair (including a wheelchair)?: None Help needed standing up from a chair using your arms (e.g., wheelchair or bedside chair)?: None Help needed to walk in hospital room?: None Help needed climbing 3-5 steps with a railing? : None 6 Click Score: 24    End of Session Equipment Utilized During Treatment: Gait belt Activity Tolerance: Patient tolerated treatment well Patient left: in bed;with call bell/phone within reach;with bed alarm set;with family/visitor present Nurse Communication: Mobility status PT Visit Diagnosis: Difficulty in walking, not elsewhere classified (R26.2);Muscle weakness (generalized) (M62.81)    Time: 8242-3536 PT Time Calculation (min) (ACUTE ONLY): 18 min   Charges:   PT Evaluation $PT Eval Moderate Complexity: 1 Mod          Toy Samarin H. Owens Shark, PT, DPT, NCS 12/10/18, 11:29 AM [phone]

## 2018-12-10 NOTE — Progress Notes (Signed)
Pt had a 7 beat run vtach. Pt asymptomatic, MD made aware. Magnesium to be added to morning labs.

## 2018-12-10 NOTE — Discharge Instructions (Signed)
Follow-up with primary care physician in 3 days Follow-up with nephrology Dr. Candiss Norse in a week

## 2019-05-26 ENCOUNTER — Ambulatory Visit: Payer: Medicare HMO

## 2019-12-01 ENCOUNTER — Ambulatory Visit (INDEPENDENT_AMBULATORY_CARE_PROVIDER_SITE_OTHER): Payer: Medicare HMO | Admitting: Vascular Surgery

## 2019-12-01 ENCOUNTER — Encounter (INDEPENDENT_AMBULATORY_CARE_PROVIDER_SITE_OTHER): Payer: Self-pay | Admitting: Vascular Surgery

## 2019-12-01 ENCOUNTER — Ambulatory Visit (INDEPENDENT_AMBULATORY_CARE_PROVIDER_SITE_OTHER): Payer: Medicare HMO

## 2019-12-01 ENCOUNTER — Other Ambulatory Visit: Payer: Self-pay

## 2019-12-01 VITALS — BP 136/72 | HR 64 | Resp 16 | Ht 68.0 in | Wt 199.0 lb

## 2019-12-01 DIAGNOSIS — E1151 Type 2 diabetes mellitus with diabetic peripheral angiopathy without gangrene: Secondary | ICD-10-CM

## 2019-12-01 DIAGNOSIS — I714 Abdominal aortic aneurysm, without rupture, unspecified: Secondary | ICD-10-CM

## 2019-12-01 DIAGNOSIS — I739 Peripheral vascular disease, unspecified: Secondary | ICD-10-CM | POA: Diagnosis not present

## 2019-12-01 DIAGNOSIS — I701 Atherosclerosis of renal artery: Secondary | ICD-10-CM | POA: Diagnosis not present

## 2019-12-01 DIAGNOSIS — I10 Essential (primary) hypertension: Secondary | ICD-10-CM

## 2019-12-01 DIAGNOSIS — E782 Mixed hyperlipidemia: Secondary | ICD-10-CM

## 2019-12-01 DIAGNOSIS — Z794 Long term (current) use of insulin: Secondary | ICD-10-CM

## 2019-12-01 NOTE — Progress Notes (Signed)
MRN : [id number]  Robert Conrad is a 79 y.o. (1941/01/12) male who presents with chief complaint of No chief complaint on file. Marland Kitchen  History of Present Illness:  The patient returns to the office for surveillance of a known abdominal aortic aneurysm. Patient denies abdominal pain or back pain, no other abdominal complaints. No changes suggesting embolic episodes.   There have been no interval changes in the patient's overall health care since his last visit.  Patient denies amaurosis fugax or TIA symptoms. There is no history of claudication or rest pain symptoms of the lower extremities. The patient denies angina or shortness of breath.   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    No outpatient medications have been marked as taking for the 12/01/19 encounter (Appointment) with Delana Meyer, Dolores Lory, MD.    Past Medical History:  Diagnosis Date   Abdominal aneurysm without mention of rupture    Arthritis    Bowel trouble 1999   Diabetes mellitus without complication ([place]) 1607   non insulin dependent   Diverticulitis    Eye problems 2010   GERD (gastroesophageal reflux disease) 2009   Gout 2009   Hearing loss    Heart disease 1990   Hyperlipidemia 2009   Hypertension 2009   Obesity, unspecified    Personal history of colonic polyps    Personal history of tobacco use, presenting hazards to health    Renal insufficiency    Special screening for malignant neoplasms, colon    Stroke (cerebrum) ([place])    Stroke ([place]) 2009   Ulcer 2009   stomach    Past Surgical History:  Procedure Laterality Date   CARDIAC CATHETERIZATION  09-30-13   COLONOSCOPY  2009   COLONOSCOPY  03-07-11   Dr Jamal Collin   COLONOSCOPY  03/07/2011   COLONOSCOPY WITH PROPOFOL N/A 07/16/2016   Procedure: COLONOSCOPY WITH PROPOFOL;  Surgeon: Christene Lye, MD;  Location:  [facility] ENDOSCOPY;  Service: Endoscopy;  Laterality: N/A;   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   CORONARY ARTERY BYPASS GRAFT     EYE SURGERY  2009   HERNIA REPAIR  2010   ventral and umbilical    STOMACH SURGERY     sigmoid colon resection    Social History Social History   Tobacco Use   Smoking status: Never Smoker   Smokeless tobacco: Never Used   Tobacco comment: quit in 1990  Substance Use Topics   Alcohol use: No   Drug use: No    Family History Family History  Problem Relation Age of Onset   Cancer Mother    Heart attack Father     Allergies  Allergen Reactions   Ciprofloxacin Hcl Rash     REVIEW OF SYSTEMS (Negative unless checked)  Constitutional: [] Weight loss  [] Fever  [] Chills Cardiac: [] Chest pain   [] Chest pressure   [] Palpitations   [] Shortness of breath when laying flat   [] Shortness of breath with exertion. Vascular:  [] Pain in legs with walking   [] Pain in legs at rest  [] History of DVT   [] Phlebitis   [] Swelling in legs   [] Varicose veins   [] Non-healing ulcers Pulmonary:   [] Uses home oxygen   [] Productive cough   [] Hemoptysis   [] Wheeze  [] COPD   [] Asthma Neurologic:  [] Dizziness   [] Seizures   [] History of stroke   [] History of TIA  [] Aphasia   []   Vissual changes   [] Weakness or numbness in arm   [] Weakness or numbness in leg Musculoskeletal:   [] Joint swelling   [] Joint pain   [] Low back pain Hematologic:  [] Easy bruising  [] Easy bleeding   [] Hypercoagulable state   [] Anemic Gastrointestinal:  [] Diarrhea   [] Vomiting  [] Gastroesophageal reflux/heartburn   [] Difficulty swallowing. Genitourinary:  [] Chronic kidney disease   [] Difficult urination  [] Frequent urination   [] Blood in urine Skin:  [] Rashes   [] Ulcers  Psychological:  [] History of anxiety   []  History of major depression.  Physical Examination  There were no vitals filed for this visit. There is no height or weight on file to calculate BMI. Gen: WD/WN, NAD Head: [place]/AT,  No temporalis wasting.  Ear/Nose/Throat: Hearing grossly intact, nares w/o erythema or drainage Eyes: PER, EOMI, sclera nonicteric.  Neck: Supple, no large masses.   Pulmonary:  Good air movement, no audible wheezing bilaterally, no use of accessory muscles.  Cardiac: RRR, no JVD Vascular:  Vessel Right Left  Radial Palpable Palpable  Gastrointestinal: Non-distended. No guarding/no peritoneal signs.  Musculoskeletal: M/S 5/5 throughout.  No deformity or atrophy.  Neurologic: CN 2-12 intact. Symmetrical.  Speech is fluent. Motor exam as listed above. Psychiatric: Judgment intact, Mood & affect appropriate for pt's clinical situation. Dermatologic: No rashes or ulcers noted.  No changes consistent with cellulitis. Lymph : No lichenification or skin changes of chronic lymphedema.  CBC Lab Results  Component Value Date   WBC 8.3 12/10/2018   HGB 12.7 (L) 12/10/2018   HCT 39.3 12/10/2018   MCV 89.3 12/10/2018   PLT 176 12/10/2018    BMET    Component Value Date/Time   NA 139 12/10/2018 0539   NA 139 11/22/2013 1147   K 4.7 12/10/2018 0539   K 4.7 11/22/2013 1147   CL 99 12/10/2018 0539   CL 104 11/22/2013 1147   CO2 32 12/10/2018 0539   CO2 28 11/22/2013 1147   GLUCOSE 121 (H) 12/10/2018 0539   GLUCOSE 131 (H) 11/22/2013 1147   BUN 28 (H) 12/10/2018 0539   BUN 22 (H) 11/22/2013 1147   CREATININE 2.23 (H) 12/10/2018 0539   CREATININE 1.62 (H) 11/22/2013 1147   CALCIUM 9.3 12/10/2018 0539   CALCIUM 8.7 11/22/2013 1147   GFRNONAA 27 (L) 12/10/2018 0539   GFRNONAA 41 (L) 11/22/2013 1147   GFRAA 31 (L) 12/10/2018 0539   GFRAA 48 (L) 11/22/2013 1147   CrCl cannot be calculated (Patient's most recent lab result is older than the maximum 21 days allowed.).  COAG No results found for: INR, PROTIME  Radiology No results found.   Assessment/Plan 1. Abdominal aortic aneurysm (AAA) without rupture ([place]) No surgery or intervention at this time. The patient has an  asymptomatic abdominal aortic aneurysm that is less than 4 cm in maximal diameter.  I have discussed the natural history of abdominal aortic aneurysm and the small risk of rupture for aneurysm less than 5 cm in size.  However, as these small aneurysms tend to enlarge over time, continued surveillance with ultrasound or CT scan is mandatory.  I have also discussed optimizing medical management with hypertension and lipid control and the importance of abstinence from tobacco.  The patient is also encouraged to exercise a minimum of 30 minutes 4 times a week.  Should the patient develop new onset abdominal or back pain or signs of peripheral embolization they are instructed to seek medical attention immediately and to alert the physician providing care that they have an  aneurysm.  The patient voices their understanding. The patient will return in 12 months with an aortic duplex.  - VAS US AORTA/IVC/ILIACS; Future  2. PAD (peripheral artery disease) (HCC)  Recommend:  The patient has evidence of atherosclerosis of the lower extremities with claudication.  The patient does not voice lifestyle limiting changes at this point in time.  Noninvasive studies do not suggest clinically significant change.  No invasive studies, angiography or surgery at this time The patient should continue walking and begin a more formal exercise program.  The patient should continue antiplatelet therapy and aggressive treatment of the lipid abnormalities  No changes in the patient's medications at this time  The patient should continue wearing graduated compression socks 10-15 mmHg strength to control the mild edema.    3. Renal artery stenosis (HCC) Patient's blood pressure is within the goal range of the American Heart Association.  At present he is on only 1 antihypertensive medication.  No renal artery intervention is indicated at this time  4. Essential hypertension Continue antihypertensive medications as  already ordered, these medications have been reviewed and there are no changes at this time.   5. Type 2 diabetes mellitus with diabetic peripheral angiopathy without gangrene, with long-term current use of insulin ([place]) Continue hypoglycemic medications as already ordered, these medications have been reviewed and there are no changes at this time.  Hgb A1C to be monitored as already arranged by primary service   6. Mixed hyperlipidemia Continue statin as ordered and reviewed, no changes at this time    Hortencia Pilar, MD  12/01/2019 8:20 AM

## 2020-09-13 ENCOUNTER — Emergency Department
Admission: EM | Admit: 2020-09-13 | Discharge: 2020-09-13 | Discharge status: Against Medical Advice | Payer: Medicare HMO | Attending: Emergency Medicine | Admitting: Emergency Medicine

## 2020-09-13 ENCOUNTER — Other Ambulatory Visit: Payer: Self-pay

## 2020-09-13 ENCOUNTER — Emergency Department: Payer: Medicare HMO

## 2020-09-13 DIAGNOSIS — Z794 Long term (current) use of insulin: Secondary | ICD-10-CM | POA: Diagnosis not present

## 2020-09-13 DIAGNOSIS — Z951 Presence of aortocoronary bypass graft: Secondary | ICD-10-CM | POA: Insufficient documentation

## 2020-09-13 DIAGNOSIS — Z7982 Long term (current) use of aspirin: Secondary | ICD-10-CM | POA: Insufficient documentation

## 2020-09-13 DIAGNOSIS — E119 Type 2 diabetes mellitus without complications: Secondary | ICD-10-CM | POA: Insufficient documentation

## 2020-09-13 DIAGNOSIS — I2510 Atherosclerotic heart disease of native coronary artery without angina pectoris: Secondary | ICD-10-CM | POA: Diagnosis not present

## 2020-09-13 DIAGNOSIS — I10 Essential (primary) hypertension: Secondary | ICD-10-CM | POA: Diagnosis not present

## 2020-09-13 DIAGNOSIS — Z79899 Other long term (current) drug therapy: Secondary | ICD-10-CM | POA: Insufficient documentation

## 2020-09-13 DIAGNOSIS — R55 Syncope and collapse: Secondary | ICD-10-CM | POA: Diagnosis not present

## 2020-09-13 LAB — TROPONIN I (HIGH SENSITIVITY)
Troponin I (High Sensitivity): 8 ng/L (ref <18)
Troponin I (High Sensitivity): 8 ng/L (ref <18)

## 2020-09-13 LAB — CBC
HCT: 39.3 % (ref 39.0–52.0)
Hemoglobin: 12.5 g/dL — ABNORMAL LOW (ref 13.0–17.0)
MCH: 28 pg (ref 26.0–34.0)
MCHC: 31.8 g/dL (ref 30.0–36.0)
MCV: 88.1 fL (ref 80.0–100.0)
Platelets: 218 10*3/uL (ref 150–400)
RBC: 4.46 MIL/uL (ref 4.22–5.81)
RDW: 13.8 % (ref 11.5–15.5)
WBC: 7.7 10*3/uL (ref 4.0–10.5)
nRBC: 0 % (ref 0.0–0.2)

## 2020-09-13 LAB — BASIC METABOLIC PANEL
Anion gap: 9 (ref 5–15)
BUN: 34 mg/dL — ABNORMAL HIGH (ref 8–23)
CO2: 21 mmol/L — ABNORMAL LOW (ref 22–32)
Calcium: 8.9 mg/dL (ref 8.9–10.3)
Chloride: 106 mmol/L (ref 98–111)
Creatinine, Ser: 2.45 mg/dL — ABNORMAL HIGH (ref 0.61–1.24)
GFR, Estimated: 26 mL/min — ABNORMAL LOW (ref >60)
Glucose, Bld: 150 mg/dL — ABNORMAL HIGH (ref 70–99)
Potassium: 4.5 mmol/L (ref 3.5–5.1)
Sodium: 136 mmol/L (ref 135–145)

## 2020-09-13 NOTE — ED Notes (Signed)
Pt signed AMA form, pt alert and oriented and ambulatory at time of AMA. Declining wheelchair.

## 2020-09-13 NOTE — Discharge Instructions (Signed)
See Dr. Ubaldo Glassing as soon as possible.  Return to the ER for symptoms of concern if unable to get an appointment.

## 2020-09-13 NOTE — ED Triage Notes (Signed)
Pt to ED with wife for syncopal episodes. Wife reports pt had syncopal episode witnessed this am, takes ASA. Wife states pt told her he has been having multiple syncopal episodes starting this week Cardiac hx.  Pt disoriented. HOH Skin color  WDL

## 2020-09-13 NOTE — ED Provider Notes (Signed)
[facility] Emergency Department Provider Note ____________________________________________   Event Date/Time   First MD Initiated Contact with Patient 09/13/20 1651     (approximate)  I have reviewed the triage vital signs and the nursing notes.   HISTORY  Chief Complaint Loss of Consciousness  HPI Robert Conrad is a 80 y.o. male with history of diabetes, renal insufficiency, CVA, PAD presents to the emergency department for treatment and evaluation of syncope.  This morning he was standing at the table while his wife was at the kitchen sink doing dishes. She heard him fall onto the tile floor. When she got to him, he was on his back. She states that he was awake, but seemed foggy headed. She assisted him out of the floor to his recliner. She is aware of one other similar event this week, but didn't witness it. She reports that he got up sometime at night and then told her later that he had "passed out." She states that she wasn't overly concerned and had planned to wait until his upcoming appointments in June, but because of how hard he fell today she decided to make him come in.     Past Medical History:  Diagnosis Date  . 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 Arthritis   . Bowel trouble 1999  . Diabetes mellitus without complication ([place]) 3086   non insulin dependent  . Diverticulitis   . Eye problems 2010  . GERD (gastroesophageal reflux disease) 2009  . Gout 2009  . Hearing loss   . Heart disease 1990  . Hyperlipidemia 2009  . Hypertension 2009  . Obesity, unspecified   . Personal history of colonic polyps   . Personal history of tobacco use, presenting hazards to health   . Renal insufficiency   . Special screening for malignant neoplasms, colon   . Stroke (cerebrum) ([place])   . Stroke [facility]) 2009  . Ulcer 2009   stomach    Patient Active Problem List   Diagnosis Date Noted  . Altered mental status 12/07/2018  . PAD  (peripheral artery disease) ([employer]) 12/17/2016  . Diabetes ([place]) 12/17/2016  . Essential hypertension 12/17/2016  . Hyperlipidemia 12/17/2016  . Renal artery stenosis ([place]) 10/05/2013  . CAD (coronary artery disease), 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/27/2013  . History of colon polyps 02/24/2013  . Abdominal aortic aneurysm ([place]) 02/24/2013    Past Surgical History:  Procedure Laterality Date  . CARDIAC CATHETERIZATION  09-30-13  . COLONOSCOPY  2009  . COLONOSCOPY  03-07-11   Dr Jamal Collin  . COLONOSCOPY  03/07/2011  . COLONOSCOPY WITH PROPOFOL N/A 07/16/2016   Procedure: COLONOSCOPY WITH PROPOFOL;  Surgeon: Christene Lye, MD;  Location: [facility] ENDOSCOPY;  Service: Endoscopy;  Laterality: N/A;  . 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 CORONARY ARTERY BYPASS GRAFT    . EYE SURGERY  2009  . HERNIA REPAIR  2010   ventral and umbilical   . STOMACH SURGERY     sigmoid colon resection    Prior to Admission medications   Medication Sig Start Date End Date Taking? Authorizing Provider  amLODipine (NORVASC) 5 MG tablet Take 1 tablet by mouth daily. 03/28/19   [provider]  aspirin 81 MG tablet Take 81 mg by mouth daily.    [provider]  metoprolol succinate (TOPROL-XL) 25 MG 24 hr tablet Take 25 mg by mouth 2 (two) times daily.  02/05/13   [provider]  Multiple Vitamin (MULTIVITAMIN) tablet Take 1 tablet by mouth daily.    [provider]  Multiple Vitamins-Minerals (PRESERVISION AREDS 2) CAPS Take 1 capsule by mouth daily. Patient not taking: Reported on 12/01/2019    [provider]  omeprazole (PRILOSEC) 20 MG capsule Take 20 mg by mouth daily.  02/02/13   [provider]  simvastatin (ZOCOR) 20 MG tablet Take 20 mg by mouth daily.  02/16/13   [provider]  TOUJEO SOLOSTAR 300 UNIT/ML SOPN Inject 18 Units into the skin daily. 12/10/18   Nicholes Mango, MD  vitamin B-12 (CYANOCOBALAMIN) 1000 MCG tablet Take 1 tablet  (1,000 mcg total) by mouth daily. Patient not taking: Reported on 12/01/2019 12/10/18   Nicholes Mango, MD    Allergies Ciprofloxacin hcl  Family History  Problem Relation Age of Onset  . Cancer Mother   . Heart attack Father     Social History Social History   Tobacco Use  . Smoking status: Never Smoker  . Smokeless tobacco: Never Used  . Tobacco comment: quit in 1990  Substance Use Topics  . Alcohol use: No  . Drug use: No    Review of Systems  Constitutional: No fever/chills Eyes: No visual changes. ENT: No sore throat. Cardiovascular: Denies chest pain. Respiratory: Denies shortness of breath. Gastrointestinal: No abdominal pain.  No nausea, no vomiting.  No diarrhea.  No constipation. Genitourinary: Negative for dysuria. Musculoskeletal: Negative for back pain. Negative for pain in extremities. Skin: Negative for rash. Neurological: Negative for headaches, focal weakness or numbness. ____________________________________________   PHYSICAL EXAM:  VITAL SIGNS: ED Triage Vitals  Enc Vitals Group     BP 09/13/20 1614 (!) 130/59     Pulse Rate 09/13/20 1614 75     Resp 09/13/20 1614 20     Temp 09/13/20 1614 98.2 F (36.8 C)     Temp Source 09/13/20 1614 Oral     SpO2 09/13/20 1614 95 %     Weight 09/13/20 1615 200 lb (90.7 kg)     Height 09/13/20 1615 5\' 8"  (1.727 m)     Head Circumference --      Peak Flow --      Pain Score --      Pain Loc --      Pain Edu? --      Excl. in [place]? --     Constitutional: Alert and oriented. Well appearing and in no acute distress. Eyes: Conjunctivae are normal. PERRL. EOMI. Head: Atraumatic. Nose: No congestion/rhinnorhea. Mouth/Throat: Mucous membranes are moist.  Oropharynx non-erythematous. Neck: No stridor.   Hematological/Lymphatic/Immunilogical: No cervical lymphadenopathy. Cardiovascular: Normal rate, regular rhythm. Grossly normal heart sounds.  Good peripheral circulation. Respiratory: Normal respiratory  effort.  No retractions. Lungs CTAB. Gastrointestinal: Soft and nontender. No distention. No abdominal bruits. Genitourinary:  Musculoskeletal: No focal midline tenderness over length of spine. No lower extremity tenderness nor edema. No hip/pelvic pain on squeeze.  Neurologic:  Normal speech and language. No gross focal neurologic deficits are appreciated. No gait instability. Skin:  Skin is warm, dry and intact. No rash noted. Psychiatric: Mood and affect are normal. Speech and behavior are normal.  ____________________________________________   LABS (all labs ordered are listed, but only abnormal results are displayed)  Labs Reviewed  BASIC METABOLIC PANEL - Abnormal; Notable for the following components:      Result Value   CO2 21 (*)    Glucose, Bld 150 (*)    BUN 34 (*)    Creatinine,  Ser 2.45 (*)    GFR, Estimated 26 (*)    All other components within normal limits  CBC - Abnormal; Notable for the following components:   Hemoglobin 12.5 (*)    All other components within normal limits  URINALYSIS, COMPLETE (UACMP) WITH MICROSCOPIC  TROPONIN I (HIGH SENSITIVITY)  TROPONIN I (HIGH SENSITIVITY)   ____________________________________________  EKG  ED ECG REPORT I, Janise Gora, FNP-BC personally viewed and interpreted this ECG.   Date: 09/13/2020  EKG Time: 1621  Rate: 76  Rhythm: 1st degree AV block  Axis: normal  Intervals:right bundle branch block  ST&T Change: no ST change  ____________________________________________  RADIOLOGY  ED MD interpretation:    Chest x-ray negative for acute cardiopulmonary abnormality.  CT of the head and cervical spine negative for acute concerns.   I, Sherrie George, personally viewed and evaluated these images (plain radiographs) as part of my medical decision making, as well as reviewing the written report by the radiologist.  Official radiology report(s): DG Chest 2 View  Result Date: 09/13/2020 CLINICAL DATA:   Shortness of breath, syncopal episode, history diabetes mellitus, hypertension, stroke, former smoker EXAM: CHEST - 2 VIEW COMPARISON:  12/07/2018 FINDINGS: Normal heart size post CABG. Mediastinal contours and pulmonary vascularity normal. Atherosclerotic calcification aorta. Chronic bronchitic changes. No acute infiltrate, pleural effusion, or pneumothorax. Osseous structures unremarkable. IMPRESSION: No acute abnormalities. Chronic bronchitic changes and postsurgical changes of CABG. Aortic Atherosclerosis (ICD10-I70.0). Electronically Signed   By: Lavonia Dana M.D.   On: 09/13/2020 17:11   CT HEAD WO CONTRAST  Result Date: 09/13/2020 CLINICAL DATA:  Syncopal episode.  Trauma to the head and neck. EXAM: CT HEAD WITHOUT CONTRAST CT CERVICAL SPINE WITHOUT CONTRAST TECHNIQUE: Multidetector CT imaging of the head and cervical spine was performed following the standard protocol without intravenous contrast. Multiplanar CT image reconstructions of the cervical spine were also generated. COMPARISON:  03/18/2013 FINDINGS: CT HEAD FINDINGS Brain: Age related atrophy. No focal abnormality affects the brainstem or cerebellum. Cerebral hemispheres show old infarction in the right temporal lobe and posterior insula and at the right frontoparietal vertex. No sign of acute infarction, mass lesion, hemorrhage, hydrocephalus or extra-axial collection. Vascular: There is atherosclerotic calcification of the major vessels at the base of the brain. Skull: No skull fracture. Sinuses/Orbits: Clear/normal Other: None CT CERVICAL SPINE FINDINGS Alignment: No traumatic malalignment. Straightening of the normal cervical lordosis. Skull base and vertebrae: No fracture or primary bone lesion. Soft tissues and spinal canal: No traumatic soft tissue finding. Disc levels:  Foramen magnum widely patent.  C1-2 is unremarkable. C2-3: Chronic facet arthropathy on the right. Right foraminal narrowing due to bony encroachment. C3-4: Chronic facet  arthropathy on the right. Bony foraminal narrowing on the right. C4-5: Chronic facet arthropathy right worse than left. No compressive canal or foraminal narrowing. C5-6: Chronic facet arthropathy on the right. Bony foraminal narrowing on the right. C6-7: Chronic disc degeneration with loss of disc height and prominent endplate osteophytes. Central canal stenosis and bilateral bony foraminal stenosis. C7-T1: Facet osteoarthritis.  Mild bilateral foraminal narrowing. Upper chest: Negative Other: None IMPRESSION: Head CT: No acute or traumatic finding. Atrophy. Old infarctions in the right MCA territory. Cervical spine CT: No acute or traumatic finding. Chronic facet arthropathy right worse than left. Chronic disc space narrowing C6-7. Bony foraminal narrowing as outlined above. Electronically Signed   By: Nelson Chimes M.D.   On: 09/13/2020 16:50   CT Cervical Spine Wo Contrast  Result Date: 09/13/2020 CLINICAL DATA:  Syncopal episode.  Trauma to the head and neck. EXAM: CT HEAD WITHOUT CONTRAST CT CERVICAL SPINE WITHOUT CONTRAST TECHNIQUE: Multidetector CT imaging of the head and cervical spine was performed following the standard protocol without intravenous contrast. Multiplanar CT image reconstructions of the cervical spine were also generated. COMPARISON:  03/18/2013 FINDINGS: CT HEAD FINDINGS Brain: Age related atrophy. No focal abnormality affects the brainstem or cerebellum. Cerebral hemispheres show old infarction in the right temporal lobe and posterior insula and at the right frontoparietal vertex. No sign of acute infarction, mass lesion, hemorrhage, hydrocephalus or extra-axial collection. Vascular: There is atherosclerotic calcification of the major vessels at the base of the brain. Skull: No skull fracture. Sinuses/Orbits: Clear/normal Other: None CT CERVICAL SPINE FINDINGS Alignment: No traumatic malalignment. Straightening of the normal cervical lordosis. Skull base and vertebrae: No fracture or  primary bone lesion. Soft tissues and spinal canal: No traumatic soft tissue finding. Disc levels:  Foramen magnum widely patent.  C1-2 is unremarkable. C2-3: Chronic facet arthropathy on the right. Right foraminal narrowing due to bony encroachment. C3-4: Chronic facet arthropathy on the right. Bony foraminal narrowing on the right. C4-5: Chronic facet arthropathy right worse than left. No compressive canal or foraminal narrowing. C5-6: Chronic facet arthropathy on the right. Bony foraminal narrowing on the right. C6-7: Chronic disc degeneration with loss of disc height and prominent endplate osteophytes. Central canal stenosis and bilateral bony foraminal stenosis. C7-T1: Facet osteoarthritis.  Mild bilateral foraminal narrowing. Upper chest: Negative Other: None IMPRESSION: Head CT: No acute or traumatic finding. Atrophy. Old infarctions in the right MCA territory. Cervical spine CT: No acute or traumatic finding. Chronic facet arthropathy right worse than left. Chronic disc space narrowing C6-7. Bony foraminal narrowing as outlined above. Electronically Signed   By: Nelson Chimes M.D.   On: 09/13/2020 16:50    ____________________________________________   PROCEDURES  Procedure(s) performed (including Critical Care):  Procedures  ____________________________________________   INITIAL IMPRESSION / ASSESSMENT AND PLAN     80 year old male presenting to the emergency department for treatment and evaluation after experiencing a syncopal episode earlier today.  See HPI for further details.  Awaiting lab, chest x-ray, and CT results.  Patient denies chest pain or shortness of breath at this time.  He denies injury from his fall.  His wife is at bedside who provides the majority of the information as the patient is extremely hard of hearing.  DIFFERENTIAL DIAGNOSIS  Electrolyte imbalance, cardiac event, simple syncope, intracranial pathology  ED COURSE  Labs, CT, and exam reassuring.    ----------------------------------------- 6:38 PM on 09/13/2020 -----------------------------------------  Patient suddenly became very upset about being in the ER and very much wants to leave. His wife is at bedside who is trying to talk him into staying. Plan was to admit for cardiac monitoring and further workup to ensure his cardiac rhythm wasn't changing and causing his syncopal events. He is angry with his wife for wanting him to stay in the hospital. I have advised him that he may die if he leaves. He states, "well if that happens it was just my time to go." No documented history of dementia and makes his own decisions. Will have him sign AMA.  Dr. Charna Archer also spoke with patient. No change in plan. He was instructed to follow up with Dr. Ubaldo Glassing as soon as possible.    ___________________________________________   FINAL CLINICAL IMPRESSION(S) / ED DIAGNOSES  Final diagnoses:  Syncope and collapse     ED Discharge Orders  None       Robert Conrad was evaluated in Emergency Department on 09/13/2020 for the symptoms described in the history of present illness. He was evaluated in the context of the global COVID-19 pandemic, which necessitated consideration that the patient might be at risk for infection with the SARS-CoV-2 virus that causes COVID-19. Institutional protocols and algorithms that pertain to the evaluation of patients at risk for COVID-19 are in a state of rapid change based on information released by regulatory bodies including the CDC and federal and state organizations. These policies and algorithms were followed during the patient's care in the ED.   Note:  This document was prepared using Dragon voice recognition software and may include unintentional dictation errors.   Victorino Dike, FNP 09/13/20 1851    Blake Divine, MD 09/18/20 [phone]

## 2020-09-28 ENCOUNTER — Other Ambulatory Visit: Payer: Self-pay

## 2020-09-28 ENCOUNTER — Emergency Department: Payer: Medicare HMO

## 2020-09-28 ENCOUNTER — Emergency Department
Admission: EM | Admit: 2020-09-28 | Discharge: 2020-09-28 | Disposition: A | Discharge status: Home / Self Care | Payer: Medicare HMO | Attending: Emergency Medicine | Admitting: Emergency Medicine

## 2020-09-28 DIAGNOSIS — E119 Type 2 diabetes mellitus without complications: Secondary | ICD-10-CM | POA: Diagnosis not present

## 2020-09-28 DIAGNOSIS — Z79899 Other long term (current) drug therapy: Secondary | ICD-10-CM | POA: Insufficient documentation

## 2020-09-28 DIAGNOSIS — I10 Essential (primary) hypertension: Secondary | ICD-10-CM | POA: Insufficient documentation

## 2020-09-28 DIAGNOSIS — Z794 Long term (current) use of insulin: Secondary | ICD-10-CM | POA: Diagnosis not present

## 2020-09-28 DIAGNOSIS — Z7982 Long term (current) use of aspirin: Secondary | ICD-10-CM | POA: Diagnosis not present

## 2020-09-28 DIAGNOSIS — R404 Transient alteration of awareness: Secondary | ICD-10-CM | POA: Diagnosis not present

## 2020-09-28 DIAGNOSIS — I2510 Atherosclerotic heart disease of native coronary artery without angina pectoris: Secondary | ICD-10-CM | POA: Diagnosis not present

## 2020-09-28 DIAGNOSIS — R451 Restlessness and agitation: Secondary | ICD-10-CM | POA: Insufficient documentation

## 2020-09-28 DIAGNOSIS — R4182 Altered mental status, unspecified: Secondary | ICD-10-CM | POA: Diagnosis present

## 2020-09-28 DIAGNOSIS — Z951 Presence of aortocoronary bypass graft: Secondary | ICD-10-CM | POA: Insufficient documentation

## 2020-09-28 DIAGNOSIS — R410 Disorientation, unspecified: Secondary | ICD-10-CM | POA: Diagnosis not present

## 2020-09-28 LAB — COMPREHENSIVE METABOLIC PANEL
ALT: 20 U/L (ref 0–44)
AST: 27 U/L (ref 15–41)
Albumin: 3.9 g/dL (ref 3.5–5.0)
Alkaline Phosphatase: 40 U/L (ref 38–126)
Anion gap: 8 (ref 5–15)
BUN: 33 mg/dL — ABNORMAL HIGH (ref 8–23)
CO2: 23 mmol/L (ref 22–32)
Calcium: 9.2 mg/dL (ref 8.9–10.3)
Chloride: 103 mmol/L (ref 98–111)
Creatinine, Ser: 2.24 mg/dL — ABNORMAL HIGH (ref 0.61–1.24)
GFR, Estimated: 29 mL/min — ABNORMAL LOW (ref >60)
Glucose, Bld: 117 mg/dL — ABNORMAL HIGH (ref 70–99)
Potassium: 4.9 mmol/L (ref 3.5–5.1)
Sodium: 134 mmol/L — ABNORMAL LOW (ref 135–145)
Total Bilirubin: 1.3 mg/dL — ABNORMAL HIGH (ref 0.3–1.2)
Total Protein: 7 g/dL (ref 6.5–8.1)

## 2020-09-28 LAB — CBC
HCT: 39.5 % (ref 39.0–52.0)
Hemoglobin: 12.5 g/dL — ABNORMAL LOW (ref 13.0–17.0)
MCH: 28 pg (ref 26.0–34.0)
MCHC: 31.6 g/dL (ref 30.0–36.0)
MCV: 88.4 fL (ref 80.0–100.0)
Platelets: 205 10*3/uL (ref 150–400)
RBC: 4.47 MIL/uL (ref 4.22–5.81)
RDW: 13.9 % (ref 11.5–15.5)
WBC: 7.9 10*3/uL (ref 4.0–10.5)
nRBC: 0 % (ref 0.0–0.2)

## 2020-09-28 NOTE — ED Notes (Signed)
Pt escorted to ER room. Pt now stating that he does not want to stay for further evaluation. Pt's son at bedside who is attempting to redirect pt but he is still stating that he wants to leave. PA notified.

## 2020-09-28 NOTE — ED Provider Notes (Signed)
[facility] Emergency Department Provider Note  ____________________________________________   Event Date/Time   First MD Initiated Contact with Patient 09/28/20 1239     (approximate)  I have reviewed the triage vital signs and the nursing notes.  HISTORY  Chief Complaint Altered Mental Status  HPI Robert Conrad is a 80 y.o. male with the below medical history, presents to the ED accompanied by his adult son, for the office when he was presenting for cardiac evaluation.  According to the son, the patient was at his normal level of health and cognition when he picked him up this morning to attend the appointment.  Sometime in route to the appointment, the son reports that the dad was "not his dad."  He describes the patient being irate and agitated.  He was not physically combative, but apparently was not his typical jovial self, but was making negative remarks.  The behavior continue while they were in the outpatient cardiology clinic.  The providers here apparently evaluated the patient where he was found to be ANO x4.  There were no obvious signs of stroke or weakness.  The son reports the father continued to show signs of confusion and inability to find words when answering questions.  According to the son report, patient apparently had a similar episode several weeks ago but resolved spontaneous.  He also had a mechanical fall 2 weeks ago, and was evaluated in the ED with a negative head neck CT at that time.  Patient apparently left AMA at that time, related to the plan for further work-up.  He presents today, apparently back to baseline according to the son, at the time of this evaluation.  The patient is adamant about the fact that he does not want to stay in the ED, and is "refusing further treatment." The son reports the father is apparently back to baseline at this time.  He is not endorsing any pain, weakness, or recent falls.  He does not seem to clearly  understand why he was brought to the ED, but is aware of the fact that he has a CT scan pending, and is being evaluated because he had some confusion.    Past Medical History:  Diagnosis Date  . 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 Arthritis   . Bowel trouble 1999  . Diabetes mellitus without complication ([place]) 5366   non insulin dependent  . Diverticulitis   . Eye problems 2010  . GERD (gastroesophageal reflux disease) 2009  . Gout 2009  . Hearing loss   . Heart disease 1990  . Hyperlipidemia 2009  . Hypertension 2009  . Obesity, unspecified   . Personal history of colonic polyps   . Personal history of tobacco use, presenting hazards to health   . Renal insufficiency   . Special screening for malignant neoplasms, colon   . Stroke (cerebrum) ([place])   . Stroke [facility]) 2009  . Ulcer 2009   stomach    Patient Active Problem List   Diagnosis Date Noted  . Altered mental status 12/07/2018  . PAD (peripheral artery disease) ([place]) 12/17/2016  . Diabetes ([place]) 12/17/2016  . Essential hypertension 12/17/2016  . Hyperlipidemia 12/17/2016  . Renal artery stenosis ([place]) 10/05/2013  . CAD (coronary artery disease), 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/27/2013  . History of colon polyps 02/24/2013  . Abdominal aortic aneurysm ([place]) 02/24/2013    Past Surgical History:  Procedure Laterality Date  . CARDIAC CATHETERIZATION  09-30-13  .  COLONOSCOPY  2009  . COLONOSCOPY  03-07-11   Dr Jamal Collin  . COLONOSCOPY  03/07/2011  . COLONOSCOPY WITH PROPOFOL N/A 07/16/2016   Procedure: COLONOSCOPY WITH PROPOFOL;  Surgeon: Christene Lye, MD;  Location: [facility] ENDOSCOPY;  Service: Endoscopy;  Laterality: N/A;  . 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 CORONARY ARTERY BYPASS GRAFT    . EYE SURGERY  2009  . HERNIA REPAIR  2010   ventral and umbilical   . STOMACH SURGERY     sigmoid colon resection    Prior to Admission medications   Medication Sig Start Date End Date Taking?  Authorizing Provider  amLODipine (NORVASC) 5 MG tablet Take 1 tablet by mouth daily. 03/28/19   [provider]  aspirin 81 MG tablet Take 81 mg by mouth daily.    [provider]  metoprolol succinate (TOPROL-XL) 25 MG 24 hr tablet Take 25 mg by mouth 2 (two) times daily.  02/05/13   [provider]  Multiple Vitamin (MULTIVITAMIN) tablet Take 1 tablet by mouth daily.    [provider]  Multiple Vitamins-Minerals (PRESERVISION AREDS 2) CAPS Take 1 capsule by mouth daily. Patient not taking: Reported on 12/01/2019    [provider]  omeprazole (PRILOSEC) 20 MG capsule Take 20 mg by mouth daily.  02/02/13   [provider]  simvastatin (ZOCOR) 20 MG tablet Take 20 mg by mouth daily.  02/16/13   [provider]  TOUJEO SOLOSTAR 300 UNIT/ML SOPN Inject 18 Units into the skin daily. 12/10/18   Nicholes Mango, MD  vitamin B-12 (CYANOCOBALAMIN) 1000 MCG tablet Take 1 tablet (1,000 mcg total) by mouth daily. Patient not taking: Reported on 12/01/2019 12/10/18   Nicholes Mango, MD    Allergies Ciprofloxacin hcl  Family History  Problem Relation Age of Onset  . Cancer Mother   . Heart attack Father     Social History Social History   Tobacco Use  . Smoking status: Never Smoker  . Smokeless tobacco: Never Used  . Tobacco comment: quit in 1990  Substance Use Topics  . Alcohol use: No  . Drug use: No    Review of Systems  Constitutional: No fever/chills Eyes: No visual changes. ENT: No sore throat. Cardiovascular: Denies chest pain. Respiratory: Denies shortness of breath. Gastrointestinal: No abdominal pain.  No nausea, no vomiting.  No diarrhea.  No constipation. Genitourinary: Negative for dysuria. Musculoskeletal: Negative for back pain. Skin: Negative for rash. Neurological: Negative for headaches, focal weakness or numbness. ____________________________________________   PHYSICAL EXAM:  VITAL SIGNS: ED Triage Vitals   Enc Vitals Group     BP 09/28/20 1128 (!) 147/59     Pulse Rate 09/28/20 1128 65     Resp 09/28/20 1128 16     Temp 09/28/20 1128 97.9 F (36.6 C)     Temp Source 09/28/20 1128 Oral     SpO2 09/28/20 1128 96 %     Weight 09/28/20 1130 210 lb (95.3 kg)     Height 09/28/20 1130 5\' 8"  (1.727 m)     Head Circumference --      Peak Flow --      Pain Score --      Pain Loc --      Pain Edu? --      Excl. in [place]? --    Constitutional: Alert and oriented. Well appearing and in no acute distress. Eyes: Conjunctivae are normal. PERRL. EOMI. Head: Atraumatic. Mouth/Throat: Mucous membranes are moist.  Oropharynx non-erythematous. Neck: No stridor.   Cardiovascular: Normal rate, regular rhythm. Grossly normal heart sounds.  Good peripheral circulation. Respiratory: Normal respiratory effort.  No retractions. Lungs CTAB. Gastrointestinal: Soft and nontender. No distention. No abdominal bruits. No CVA tenderness. Musculoskeletal: No lower extremity tenderness nor edema.  No joint effusions. Neurologic:  Normal speech and language. No gross focal neurologic deficits are appreciated. No gait instability. Skin:  Skin is warm, dry and intact. No rash noted. Psychiatric: Mood is and affect is . Speech stressed and behavior is normal. ____________________________________________   LABS (all labs ordered are listed, but only abnormal results are displayed)  Labs Reviewed  COMPREHENSIVE METABOLIC PANEL - Abnormal; Notable for the following components:      Result Value   Sodium 134 (*)    Glucose, Bld 117 (*)    BUN 33 (*)    Creatinine, Ser 2.24 (*)    Total Bilirubin 1.3 (*)    GFR, Estimated 29 (*)    All other components within normal limits  CBC - Abnormal; Notable for the following components:   Hemoglobin 12.5 (*)    All other components within normal limits  CBG MONITORING, ED    ____________________________________________  EKG  ____________________________________________  RADIOLOGY I, Melvenia Needles, personally viewed and evaluated these images (plain radiographs) as part of my medical decision making, as well as reviewing the written report by the radiologist.  ED MD interpretation:  Agree with report  Official radiology report(s): CT Head Wo Contrast  Result Date: 09/28/2020 CLINICAL DATA:  Neuro deficit, acute, stroke suspected AMS, hx stroke EXAM: CT HEAD WITHOUT CONTRAST TECHNIQUE: Contiguous axial images were obtained from the base of the skull through the vertex without intravenous contrast. COMPARISON:  CT  09/13/2020 FINDINGS: Brain: No evidence of acute intracranial hemorrhage. There is right temporal lobe, posterior insula, and right frontoparietal vertex encephalomalacia compatible with old infarct. There is no evidence of new large territory infarct.No evidence of extra-axial collection. Unchanged mild atrophy. Vascular: Calcified atherosclerotic disease. Skull: Normal. Negative for fracture or focal lesion. Sinuses/Orbits: Mild ethmoid air cell mucosal thickening. Other: None. IMPRESSION: No acute intracranial abnormality, specifically no acute hemorrhage. 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 If there is concern for acute infarct, consider MRI. Electronically Signed   By: Maurine Simmering   On: 09/28/2020 12:46    ____________________________________________   PROCEDURES  Procedure(s) performed (including Critical Care):  Procedures   ____________________________________________   INITIAL IMPRESSION / ASSESSMENT AND PLAN / ED COURSE  As part of my medical decision making, I reviewed the following data within the [place] History obtained from family, Old chart reviewed, Radiograph reviewed WNL and Notes from prior ED visits   Differential diagnosis includes, but is not limited to, alcohol, illicit or prescription  medications, or other toxic ingestion; intracranial pathology such as stroke or intracerebral hemorrhage; fever or infectious causes including sepsis; hypoxemia and/or hypercarbia; uremia; trauma; endocrine related disorders such as diabetes, hypoglycemia, and thyroid-related diseases; hypertensive encephalopathy; etc.  Geriatric patient presents with accompanied by his adult son, from the outpatient cardiology clinic for evaluation of altered level status.  Patient apparently had an episode where he was confused in route to his outpatient procedure.  He was referred to the ED from the cardiology clinic for evaluation.  Patient apparently is at baseline according to the son, upon arrival to the ED.  Patient is adamant about not wanting any intervention at this time.  He is also refusing treatment.  His exam and CT scan are reassuring as it shows no acute hemorrhagic event.  Patient declined further evaluation by MRI at this time.  He is of sound mind and is capable of making medical decisions according to the son.  Patient will discharge at this time under his own recognizance, to follow-up with primary provider.  Referrals been made to neurology.  Return precautions have been discussed. ____________________________________________   FINAL CLINICAL IMPRESSION(S) / ED DIAGNOSES  Final diagnoses:  Transient alteration of awareness     ED Discharge Orders    None      *Please note:  Robert Conrad was evaluated in Emergency Department on 09/28/2020 for the symptoms described in the history of present illness. He was evaluated in the context of the global COVID-19 pandemic, which necessitated consideration that the patient might be at risk for infection with the SARS-CoV-2 virus that causes COVID-19. Institutional protocols and algorithms that pertain to the evaluation of patients at risk for COVID-19 are in a state of rapid change based on information released by regulatory bodies including the  CDC and federal and state organizations. These policies and algorithms were followed during the patient's care in the ED.  Some ED evaluations and interventions may be delayed as a result of limited staffing during and the pandemic.*   Note:  This document was prepared using Dragon voice recognition software and may include unintentional dictation errors.    Melvenia Needles, PA-C 09/28/20 1431    Blake Divine, MD 09/28/20 1455

## 2020-09-28 NOTE — ED Triage Notes (Addendum)
Pt arrives from outpatient procedure. Pt was to have a electrocardiogram at 1100, but was brought to ed for altered mental status and confusion. Pt alert and oriented x 4 on arrival. No obvious stoke symptoms at this time. Pt son reports increased confusion and inability to delayed when answering questions. Pt son states " he took forever this morning to decide where he was going to eat, and he normally tells you right away".  Son reports ymptoms started several weeks ago but resolved for a few days. Per son pt seemed fine initially but after being with pt for a short time he noticed confusion. Denies any increased weakness, on assessment strength equal in all extremities,no facial droop, speech clear. NAD noted at this time.

## 2020-09-28 NOTE — Discharge Instructions (Addendum)
Your exam and CT scan of the head is normal at this time. Further testing and evaluation is recommended. You have decided to discharge from the ED before a complete work-up can be completed. You should return to the ED for recurrent symptoms, or see Neurology as referred.

## 2020-10-24 ENCOUNTER — Other Ambulatory Visit: Payer: Self-pay | Admitting: Nephrology

## 2020-10-24 DIAGNOSIS — N184 Chronic kidney disease, stage 4 (severe): Secondary | ICD-10-CM

## 2020-10-24 DIAGNOSIS — N179 Acute kidney failure, unspecified: Secondary | ICD-10-CM

## 2020-10-26 ENCOUNTER — Emergency Department: Payer: Medicare HMO

## 2020-10-26 ENCOUNTER — Emergency Department
Admission: EM | Admit: 2020-10-26 | Discharge: 2020-10-26 | Disposition: A | Discharge status: Home / Self Care | Payer: Medicare HMO | Attending: Emergency Medicine | Admitting: Emergency Medicine

## 2020-10-26 ENCOUNTER — Other Ambulatory Visit: Payer: Self-pay

## 2020-10-26 DIAGNOSIS — I10 Essential (primary) hypertension: Secondary | ICD-10-CM | POA: Insufficient documentation

## 2020-10-26 DIAGNOSIS — Z87891 Personal history of nicotine dependence: Secondary | ICD-10-CM | POA: Insufficient documentation

## 2020-10-26 DIAGNOSIS — M7989 Other specified soft tissue disorders: Secondary | ICD-10-CM | POA: Diagnosis not present

## 2020-10-26 DIAGNOSIS — Z794 Long term (current) use of insulin: Secondary | ICD-10-CM | POA: Insufficient documentation

## 2020-10-26 DIAGNOSIS — Z79899 Other long term (current) drug therapy: Secondary | ICD-10-CM | POA: Diagnosis not present

## 2020-10-26 DIAGNOSIS — Z951 Presence of aortocoronary bypass graft: Secondary | ICD-10-CM | POA: Insufficient documentation

## 2020-10-26 DIAGNOSIS — Z7982 Long term (current) use of aspirin: Secondary | ICD-10-CM | POA: Insufficient documentation

## 2020-10-26 DIAGNOSIS — R410 Disorientation, unspecified: Secondary | ICD-10-CM | POA: Diagnosis present

## 2020-10-26 DIAGNOSIS — E119 Type 2 diabetes mellitus without complications: Secondary | ICD-10-CM | POA: Diagnosis not present

## 2020-10-26 DIAGNOSIS — I2510 Atherosclerotic heart disease of native coronary artery without angina pectoris: Secondary | ICD-10-CM | POA: Diagnosis not present

## 2020-10-26 LAB — COMPREHENSIVE METABOLIC PANEL
ALT: 16 U/L (ref 0–44)
AST: 15 U/L (ref 15–41)
Albumin: 4 g/dL (ref 3.5–5.0)
Alkaline Phosphatase: 35 U/L — ABNORMAL LOW (ref 38–126)
Anion gap: 8 (ref 5–15)
BUN: 42 mg/dL — ABNORMAL HIGH (ref 8–23)
CO2: 23 mmol/L (ref 22–32)
Calcium: 9.1 mg/dL (ref 8.9–10.3)
Chloride: 103 mmol/L (ref 98–111)
Creatinine, Ser: 2.49 mg/dL — ABNORMAL HIGH (ref 0.61–1.24)
GFR, Estimated: 25 mL/min — ABNORMAL LOW (ref >60)
Glucose, Bld: 110 mg/dL — ABNORMAL HIGH (ref 70–99)
Potassium: 4.7 mmol/L (ref 3.5–5.1)
Sodium: 134 mmol/L — ABNORMAL LOW (ref 135–145)
Total Bilirubin: 0.8 mg/dL (ref 0.3–1.2)
Total Protein: 7.3 g/dL (ref 6.5–8.1)

## 2020-10-26 LAB — URINALYSIS, COMPLETE (UACMP) WITH MICROSCOPIC
Bilirubin Urine: NEGATIVE
Glucose, UA: NEGATIVE mg/dL
Hgb urine dipstick: NEGATIVE
Ketones, ur: NEGATIVE mg/dL
Leukocytes,Ua: NEGATIVE
Nitrite: NEGATIVE
Protein, ur: NEGATIVE mg/dL
Specific Gravity, Urine: 1.005 (ref 1.005–1.030)
Squamous Epithelial / HPF: NONE SEEN (ref 0–5)
pH: 5 (ref 5.0–8.0)

## 2020-10-26 LAB — PROTIME-INR
INR: 1 (ref 0.8–1.2)
Prothrombin Time: 13.5 seconds (ref 11.4–15.2)

## 2020-10-26 LAB — CBC
HCT: 40.2 % (ref 39.0–52.0)
Hemoglobin: 12.9 g/dL — ABNORMAL LOW (ref 13.0–17.0)
MCH: 28 pg (ref 26.0–34.0)
MCHC: 32.1 g/dL (ref 30.0–36.0)
MCV: 87.4 fL (ref 80.0–100.0)
Platelets: 186 10*3/uL (ref 150–400)
RBC: 4.6 MIL/uL (ref 4.22–5.81)
RDW: 14.2 % (ref 11.5–15.5)
WBC: 8.7 10*3/uL (ref 4.0–10.5)
nRBC: 0 % (ref 0.0–0.2)

## 2020-10-26 NOTE — ED Provider Notes (Signed)
[facility] Emergency Department Provider Note ____________________________________________   Event Date/Time   First MD Initiated Contact with Patient 10/26/20 1801     (approximate)  I have reviewed the triage vital signs and the nursing notes.   HISTORY  Chief Complaint Altered Mental Status  HPI Robert Conrad is a 80 y.o. male with history of CVA, AMS, CVA and other chronic history as listed below presents to the emergency department for treatment and evaluation of increased confusion that gets worse at night.  Per the daughter, over the past couple of weeks he has become very restless and is not sleeping. He has had appointments with both cardiology and nephrology to investigate several episodes of altered mental status/syncope without a specific diagnosis being made at this time.  He is scheduled to see nephrology for "an ultrasound of my belly."  Patient denies any pain, weakness, shortness of breath, fevers, or recent falls.  Daughter mentions the possibility of a urinary tract infection. She has not noticed any weakness. He has also had worsening bilateral lower leg swelling over the past few days.         Past Medical History:  Diagnosis Date   Abdominal aneurysm without mention of rupture    Arthritis    Bowel trouble 1999   Diabetes mellitus without complication ([place]) 7846   non insulin dependent   Diverticulitis    Eye problems 2010   GERD (gastroesophageal reflux disease) 2009   Gout 2009   Hearing loss    Heart disease 1990   Hyperlipidemia 2009   Hypertension 2009   Obesity, unspecified    Personal history of colonic polyps    Personal history of tobacco use, presenting hazards to health    Renal insufficiency    Special screening for malignant neoplasms, colon    Stroke (cerebrum) ([place])    Stroke ([place]) 2009   Ulcer 2009   stomach    Patient Active Problem List   Diagnosis Date Noted   Altered mental status 12/07/2018    PAD (peripheral artery disease) ([place]) 12/17/2016   Diabetes ([place]) 12/17/2016   Essential hypertension 12/17/2016   Hyperlipidemia 12/17/2016   Renal artery stenosis ([place]) 10/05/2013   CAD (coronary artery disease), 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/27/2013   History of colon polyps 02/24/2013   Abdominal aortic aneurysm ([place]) 02/24/2013    Past Surgical History:  Procedure Laterality Date   CARDIAC CATHETERIZATION  09-30-13   COLONOSCOPY  2009   COLONOSCOPY  03-07-11   Dr Jamal Collin   COLONOSCOPY  03/07/2011   COLONOSCOPY WITH PROPOFOL N/A 07/16/2016   Procedure: COLONOSCOPY WITH PROPOFOL;  Surgeon: Christene Lye, MD;  Location: [facility] ENDOSCOPY;  Service: Endoscopy;  Laterality: N/A;   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   CORONARY ARTERY BYPASS GRAFT     EYE SURGERY  2009   HERNIA REPAIR  2010   ventral and umbilical    STOMACH SURGERY     sigmoid colon resection    Prior to Admission medications   Medication Sig Start Date End Date Taking? Authorizing Provider  amLODipine (NORVASC) 5 MG tablet Take 1 tablet by mouth daily. 03/28/19   [provider]  aspirin 81 MG tablet Take 81 mg by mouth daily.    [provider]  metoprolol succinate (TOPROL-XL) 25 MG 24 hr tablet Take 25 mg by mouth 2 (two) times daily.  02/05/13   [provider]  Multiple Vitamin (MULTIVITAMIN) tablet Take 1 tablet  by mouth daily.    [provider]  Multiple Vitamins-Minerals (PRESERVISION AREDS 2) CAPS Take 1 capsule by mouth daily. Patient not taking: Reported on 12/01/2019    [provider]  omeprazole (PRILOSEC) 20 MG capsule Take 20 mg by mouth daily.  02/02/13   [provider]  simvastatin (ZOCOR) 20 MG tablet Take 20 mg by mouth daily.  02/16/13   [provider]  TOUJEO SOLOSTAR 300 UNIT/ML SOPN Inject 18 Units into the skin daily. 12/10/18   Nicholes Mango, MD  vitamin B-12 (CYANOCOBALAMIN) 1000 MCG tablet Take 1 tablet (1,000 mcg  total) by mouth daily. Patient not taking: Reported on 12/01/2019 12/10/18   Nicholes Mango, MD    Allergies Ciprofloxacin hcl  Family History  Problem Relation Age of Onset   Cancer Mother    Heart attack Father     Social History Social History   Tobacco Use   Smoking status: Former    Years: 40.00    Pack years: 0.00    Types: Cigarettes   Smokeless tobacco: Never   Tobacco comments:    quit in 1990  Substance Use Topics   Alcohol use: No   Drug use: No    Review of Systems  Constitutional: No fever/chills Eyes: No visual changes. ENT: No sore throat. Cardiovascular: Denies chest pain. Respiratory: Denies shortness of breath. Gastrointestinal: No abdominal pain.  No nausea, no vomiting.  No diarrhea.  No constipation. Genitourinary: Negative for dysuria. Musculoskeletal: Negative for back pain. Skin: Negative for rash. Neurological: Negative for headaches, focal weakness or numbness  ____________________________________________   PHYSICAL EXAM:  VITAL SIGNS: ED Triage Vitals  Enc Vitals Group     BP 10/26/20 1545 (!) 152/63     Pulse Rate 10/26/20 1545 66     Resp 10/26/20 1545 18     Temp 10/26/20 1545 98.4 F (36.9 C)     Temp Source 10/26/20 1545 Oral     SpO2 10/26/20 1545 98 %     Weight 10/26/20 1546 198 lb (89.8 kg)     Height 10/26/20 1546 5\' 8"  (1.727 m)     Head Circumference --      Peak Flow --      Pain Score 10/26/20 1546 0     Pain Loc --      Pain Edu? --      Excl. in [place]? --     Constitutional: Alert and oriented. Well appearing and in no acute distress. Eyes: Conjunctivae are normal. PERRL. EOMI. Head: Atraumatic. Nose: No congestion/rhinnorhea. Mouth/Throat: Mucous membranes are moist.  Oropharynx non-erythematous. Neck: No stridor.   Hematological/Lymphatic/Immunilogical: No cervical lymphadenopathy. Cardiovascular: Normal rate, regular rhythm. Grossly normal heart sounds.  Good peripheral circulation. Bilateral 3+ pitting  edema lower extremities. Respiratory: Normal respiratory effort.  No retractions. Lungs CTAB. Gastrointestinal: Soft and nontender. Distended but not rigid. No abdominal bruits. No CVA tenderness. Genitourinary:  Musculoskeletal: No lower extremity tenderness.  Neurologic:  Normal speech and language. No gross focal neurologic deficits are appreciated. No gait instability--pacing in room without assistance. Skin:  Skin is warm, dry and intact. No rash noted. Psychiatric: Mood and affect are normal. Speech and behavior are normal.  ____________________________________________   LABS (all labs ordered are listed, but only abnormal results are displayed)  Labs Reviewed  COMPREHENSIVE METABOLIC PANEL - Abnormal; Notable for the following components:      Result Value   Sodium 134 (*)    Glucose, Bld 110 (*)  BUN 42 (*)    Creatinine, Ser 2.49 (*)    Alkaline Phosphatase 35 (*)    GFR, Estimated 25 (*)    All other components within normal limits  CBC - Abnormal; Notable for the following components:   Hemoglobin 12.9 (*)    All other components within normal limits  URINALYSIS, COMPLETE (UACMP) WITH MICROSCOPIC - Abnormal; Notable for the following components:   Color, Urine STRAW (*)    APPearance CLEAR (*)    Bacteria, UA RARE (*)    All other components within normal limits  PROTIME-INR  BRAIN NATRIURETIC PEPTIDE   ____________________________________________  EKG  ED ECG REPORT I, Marissah Vandemark, FNP-BC personally viewed and interpreted this ECG.   Date: 10/26/2020  Rate: 63  Rhythm: unchanged from previous tracings, normal sinus rhythm, 1st degree AV block  Axis: normal  Intervals:first-degree A-V block   ST&T Change: no ST elevation  ____________________________________________  RADIOLOGY  ED MD interpretation:    Chest xray without acute concerns. CT head without acute intracranial findings.   I, Sherrie George, personally viewed and evaluated these images  (plain radiographs) as part of my medical decision making, as well as reviewing the written report by the radiologist.  Official radiology report(s): DG Chest 1 View  Result Date: 10/26/2020 CLINICAL DATA:  Lower extremity swelling.  Bilateral leg edema. EXAM: CHEST  1 VIEW COMPARISON:  09/13/2020 FINDINGS: Post median sternotomy and CABG. The second uppermost sternal wire is broken, unchanged. Stable heart size and mediastinal contours. Borderline cardiomegaly. No pulmonary edema. Mild chronic bronchial thickening is unchanged. No focal airspace disease. No significant pleural effusion. No pneumothorax. Stable osseous structures. IMPRESSION: No acute chest findings. Stable borderline cardiomegaly post CABG. Electronically Signed   By: Keith Rake M.D.   On: 10/26/2020 19:10   CT Head Wo Contrast  Result Date: 10/26/2020 CLINICAL DATA:  Altered mental status. EXAM: CT HEAD WITHOUT CONTRAST TECHNIQUE: Contiguous axial images were obtained from the base of the skull through the vertex without intravenous contrast. COMPARISON:  CT head dated Sep 28, 2020. FINDINGS: Brain: No evidence of acute infarction, hemorrhage, hydrocephalus, extra-axial collection or mass lesion/mass effect. Old right MCA territory infarct again noted. Stable atrophy. Vascular: Calcified atherosclerosis at the skullbase. No hyperdense vessel. Skull: Normal. Negative for fracture or focal lesion. Sinuses/Orbits: No acute finding. Other: None. IMPRESSION: 1. No acute intracranial abnormality. 2. Old right MCA territory infarct. Electronically Signed   By: Titus Dubin M.D.   On: 10/26/2020 18:47    ____________________________________________   PROCEDURES  Procedure(s) performed (including Critical Care):  Procedures  ____________________________________________   INITIAL IMPRESSION / ASSESSMENT AND PLAN   80 year old male presenting to the ER for symptoms as described in HPI. Will get labs, chest x-ray and head CT.  Patient anxious to leave.   DIFFERENTIAL DIAGNOSIS  UTI, dementia, CVA  ED COURSE  Chest x-ray and CT head reassuring. No UTI or concerning findings in lab studies. Results were discussed with patient and family. He was offered admission for further work up of confusion, but patient declined. He is currently alert and oriented x 4. Family willing to take him home. Strict ER return precautions given. Encouraged to call and schedule a follow up with PCP for early next week.     ___________________________________________   FINAL CLINICAL IMPRESSION(S) / ED DIAGNOSES  Final diagnoses:  Confusion     ED Discharge Orders     None        Robert Conrad  was evaluated in Emergency Department on 10/26/2020 for the symptoms described in the history of present illness. He was evaluated in the context of the global COVID-19 pandemic, which necessitated consideration that the patient might be at risk for infection with the SARS-CoV-2 virus that causes COVID-19. Institutional protocols and algorithms that pertain to the evaluation of patients at risk for COVID-19 are in a state of rapid change based on information released by regulatory bodies including the CDC and federal and state organizations. These policies and algorithms were followed during the patient's care in the ED.   Note:  This document was prepared using Dragon voice recognition software and may include unintentional dictation errors.    Victorino Dike, FNP 10/26/20 2012    Harvest Dark, MD 10/26/20 [phone]

## 2020-10-26 NOTE — ED Notes (Signed)
Additional urine sample sent to lab.

## 2020-10-26 NOTE — ED Notes (Signed)
This RN to bedside to assess pt. Pt currently away at imaging. Pt's visitor remains at bedside.

## 2020-10-26 NOTE — ED Notes (Addendum)
See triage note. Pt in with confusion. Urination frequent per daughter. Pt resp reg/unlabored; skin dry; calmly sitting on stretcher. HOH. Reports mild abd and back pain. Abd distended but soft.

## 2020-10-26 NOTE — ED Triage Notes (Addendum)
Pt with daughter for AMS- for the past 2 weeks pt has been confused- pt has been walking across the street, sitting in the neighbors yard, and not letting his wife go to bed- pt daughter thinks it could be a UTI and pt has an appointment with his urologist next week for an [place]- pt states he has been urinating more frequently

## 2020-10-26 NOTE — ED Notes (Signed)
Provider CT to bedside.

## 2020-10-26 NOTE — Discharge Instructions (Addendum)
Please follow up with primary care early next week.  Return to the ER for symptoms of concern if unable to schedule an appointment.

## 2020-10-29 ENCOUNTER — Other Ambulatory Visit: Payer: Self-pay

## 2020-10-29 ENCOUNTER — Ambulatory Visit
Admission: RE | Admit: 2020-10-29 | Discharge: 2020-10-29 | Disposition: A | Discharge status: Home / Self Care | Payer: Medicare HMO | Source: Ambulatory Visit | Attending: Nephrology | Admitting: Nephrology

## 2020-10-29 DIAGNOSIS — N184 Chronic kidney disease, stage 4 (severe): Secondary | ICD-10-CM | POA: Insufficient documentation

## 2020-10-29 DIAGNOSIS — N179 Acute kidney failure, unspecified: Secondary | ICD-10-CM | POA: Diagnosis not present

## 2020-11-12 ENCOUNTER — Ambulatory Visit (INDEPENDENT_AMBULATORY_CARE_PROVIDER_SITE_OTHER): Payer: Medicare HMO

## 2020-11-12 ENCOUNTER — Other Ambulatory Visit: Payer: Self-pay

## 2020-11-12 ENCOUNTER — Ambulatory Visit (INDEPENDENT_AMBULATORY_CARE_PROVIDER_SITE_OTHER): Payer: Medicare HMO | Admitting: Vascular Surgery

## 2020-11-12 DIAGNOSIS — I714 Abdominal aortic aneurysm, without rupture, unspecified: Secondary | ICD-10-CM

## 2020-11-13 ENCOUNTER — Other Ambulatory Visit: Payer: Self-pay | Admitting: Physician Assistant

## 2020-11-13 ENCOUNTER — Other Ambulatory Visit (HOSPITAL_COMMUNITY): Payer: Self-pay | Admitting: Physician Assistant

## 2020-11-13 DIAGNOSIS — R413 Other amnesia: Secondary | ICD-10-CM

## 2020-11-18 ENCOUNTER — Ambulatory Visit
Admission: RE | Admit: 2020-11-18 | Discharge: 2020-11-18 | Disposition: A | Discharge status: Home / Self Care | Payer: Medicare HMO | Source: Ambulatory Visit | Attending: Physician Assistant | Admitting: Physician Assistant

## 2020-11-18 ENCOUNTER — Other Ambulatory Visit: Payer: Self-pay

## 2020-11-18 DIAGNOSIS — R413 Other amnesia: Secondary | ICD-10-CM | POA: Diagnosis not present

## 2020-11-19 ENCOUNTER — Other Ambulatory Visit
Admission: RE | Admit: 2020-11-19 | Discharge: 2020-11-19 | Disposition: A | Discharge status: Home / Self Care | Payer: Medicare HMO | Source: Ambulatory Visit | Attending: Physician Assistant | Admitting: Physician Assistant

## 2020-11-19 DIAGNOSIS — R441 Visual hallucinations: Secondary | ICD-10-CM | POA: Insufficient documentation

## 2020-11-19 DIAGNOSIS — R413 Other amnesia: Secondary | ICD-10-CM | POA: Insufficient documentation

## 2020-11-19 DIAGNOSIS — R410 Disorientation, unspecified: Secondary | ICD-10-CM | POA: Insufficient documentation

## 2020-11-19 DIAGNOSIS — R4182 Altered mental status, unspecified: Secondary | ICD-10-CM | POA: Diagnosis present

## 2020-11-19 LAB — AMMONIA: Ammonia: 11 umol/L (ref 9–35)

## 2020-11-20 ENCOUNTER — Encounter (INDEPENDENT_AMBULATORY_CARE_PROVIDER_SITE_OTHER): Payer: Self-pay | Admitting: *Deleted

## 2020-11-23 ENCOUNTER — Emergency Department: Payer: Medicare HMO

## 2020-11-23 ENCOUNTER — Other Ambulatory Visit: Payer: Self-pay

## 2020-11-23 DIAGNOSIS — I714 Abdominal aortic aneurysm, without rupture: Secondary | ICD-10-CM | POA: Diagnosis present

## 2020-11-23 DIAGNOSIS — M109 Gout, unspecified: Secondary | ICD-10-CM | POA: Diagnosis present

## 2020-11-23 DIAGNOSIS — R410 Disorientation, unspecified: Secondary | ICD-10-CM

## 2020-11-23 DIAGNOSIS — Z8673 Personal history of transient ischemic attack (TIA), and cerebral infarction without residual deficits: Secondary | ICD-10-CM

## 2020-11-23 DIAGNOSIS — Z87891 Personal history of nicotine dependence: Secondary | ICD-10-CM

## 2020-11-23 DIAGNOSIS — Z7982 Long term (current) use of aspirin: Secondary | ICD-10-CM

## 2020-11-23 DIAGNOSIS — A419 Sepsis, unspecified organism: Secondary | ICD-10-CM | POA: Diagnosis not present

## 2020-11-23 DIAGNOSIS — B9620 Unspecified Escherichia coli [E. coli] as the cause of diseases classified elsewhere: Secondary | ICD-10-CM | POA: Diagnosis present

## 2020-11-23 DIAGNOSIS — H9190 Unspecified hearing loss, unspecified ear: Secondary | ICD-10-CM | POA: Diagnosis present

## 2020-11-23 DIAGNOSIS — Z20822 Contact with and (suspected) exposure to covid-19: Secondary | ICD-10-CM | POA: Diagnosis present

## 2020-11-23 DIAGNOSIS — N3001 Acute cystitis with hematuria: Secondary | ICD-10-CM | POA: Diagnosis present

## 2020-11-23 DIAGNOSIS — F05 Delirium due to known physiological condition: Secondary | ICD-10-CM | POA: Diagnosis present

## 2020-11-23 DIAGNOSIS — Z8719 Personal history of other diseases of the digestive system: Secondary | ICD-10-CM

## 2020-11-23 DIAGNOSIS — E669 Obesity, unspecified: Secondary | ICD-10-CM | POA: Diagnosis present

## 2020-11-23 DIAGNOSIS — N179 Acute kidney failure, unspecified: Secondary | ICD-10-CM | POA: Diagnosis present

## 2020-11-23 DIAGNOSIS — E1151 Type 2 diabetes mellitus with diabetic peripheral angiopathy without gangrene: Secondary | ICD-10-CM | POA: Diagnosis present

## 2020-11-23 DIAGNOSIS — R6520 Severe sepsis without septic shock: Secondary | ICD-10-CM | POA: Diagnosis present

## 2020-11-23 DIAGNOSIS — J189 Pneumonia, unspecified organism: Secondary | ICD-10-CM | POA: Diagnosis not present

## 2020-11-23 DIAGNOSIS — I129 Hypertensive chronic kidney disease with stage 1 through stage 4 chronic kidney disease, or unspecified chronic kidney disease: Secondary | ICD-10-CM | POA: Diagnosis present

## 2020-11-23 DIAGNOSIS — G9341 Metabolic encephalopathy: Secondary | ICD-10-CM | POA: Diagnosis present

## 2020-11-23 DIAGNOSIS — E1122 Type 2 diabetes mellitus with diabetic chronic kidney disease: Secondary | ICD-10-CM | POA: Diagnosis present

## 2020-11-23 DIAGNOSIS — N400 Enlarged prostate without lower urinary tract symptoms: Secondary | ICD-10-CM | POA: Diagnosis present

## 2020-11-23 DIAGNOSIS — Z881 Allergy status to other antibiotic agents status: Secondary | ICD-10-CM

## 2020-11-23 DIAGNOSIS — F0390 Unspecified dementia without behavioral disturbance: Secondary | ICD-10-CM | POA: Diagnosis present

## 2020-11-23 DIAGNOSIS — E875 Hyperkalemia: Secondary | ICD-10-CM | POA: Diagnosis present

## 2020-11-23 DIAGNOSIS — E785 Hyperlipidemia, unspecified: Secondary | ICD-10-CM | POA: Diagnosis present

## 2020-11-23 DIAGNOSIS — K219 Gastro-esophageal reflux disease without esophagitis: Secondary | ICD-10-CM | POA: Diagnosis present

## 2020-11-23 DIAGNOSIS — Z6826 Body mass index (BMI) 26.0-26.9, adult: Secondary | ICD-10-CM

## 2020-11-23 DIAGNOSIS — Z9049 Acquired absence of other specified parts of digestive tract: Secondary | ICD-10-CM

## 2020-11-23 DIAGNOSIS — I959 Hypotension, unspecified: Secondary | ICD-10-CM | POA: Diagnosis present

## 2020-11-23 DIAGNOSIS — Z79899 Other long term (current) drug therapy: Secondary | ICD-10-CM

## 2020-11-23 DIAGNOSIS — N1832 Chronic kidney disease, stage 3b: Secondary | ICD-10-CM | POA: Diagnosis present

## 2020-11-23 DIAGNOSIS — M1990 Unspecified osteoarthritis, unspecified site: Secondary | ICD-10-CM | POA: Diagnosis present

## 2020-11-23 DIAGNOSIS — I701 Atherosclerosis of renal artery: Secondary | ICD-10-CM | POA: Diagnosis present

## 2020-11-23 DIAGNOSIS — Z951 Presence of aortocoronary bypass graft: Secondary | ICD-10-CM

## 2020-11-23 DIAGNOSIS — I2510 Atherosclerotic heart disease of native coronary artery without angina pectoris: Secondary | ICD-10-CM | POA: Diagnosis present

## 2020-11-23 LAB — CBC WITH DIFFERENTIAL/PLATELET
Abs Immature Granulocytes: 0.22 10*3/uL — ABNORMAL HIGH (ref 0.00–0.07)
Basophils Absolute: 0.1 10*3/uL (ref 0.0–0.1)
Basophils Relative: 0 %
Eosinophils Absolute: 0 10*3/uL (ref 0.0–0.5)
Eosinophils Relative: 0 %
HCT: 42.7 % (ref 39.0–52.0)
Hemoglobin: 13.6 g/dL (ref 13.0–17.0)
Immature Granulocytes: 1 %
Lymphocytes Relative: 8 %
Lymphs Abs: 1.7 10*3/uL (ref 0.7–4.0)
MCH: 28.6 pg (ref 26.0–34.0)
MCHC: 31.9 g/dL (ref 30.0–36.0)
MCV: 89.7 fL (ref 80.0–100.0)
Monocytes Absolute: 2.6 10*3/uL — ABNORMAL HIGH (ref 0.1–1.0)
Monocytes Relative: 12 %
Neutro Abs: 17.2 10*3/uL — ABNORMAL HIGH (ref 1.7–7.7)
Neutrophils Relative %: 79 %
Platelets: 127 10*3/uL — ABNORMAL LOW (ref 150–400)
RBC: 4.76 MIL/uL (ref 4.22–5.81)
RDW: 14.4 % (ref 11.5–15.5)
WBC: 21.8 10*3/uL — ABNORMAL HIGH (ref 4.0–10.5)
nRBC: 0 % (ref 0.0–0.2)

## 2020-11-23 LAB — COMPREHENSIVE METABOLIC PANEL
ALT: 26 U/L (ref 0–44)
AST: 17 U/L (ref 15–41)
Albumin: 3.3 g/dL — ABNORMAL LOW (ref 3.5–5.0)
Alkaline Phosphatase: 38 U/L (ref 38–126)
Anion gap: 6 (ref 5–15)
BUN: 76 mg/dL — ABNORMAL HIGH (ref 8–23)
CO2: 23 mmol/L (ref 22–32)
Calcium: 9 mg/dL (ref 8.9–10.3)
Chloride: 107 mmol/L (ref 98–111)
Creatinine, Ser: 4.11 mg/dL — ABNORMAL HIGH (ref 0.61–1.24)
GFR, Estimated: 14 mL/min — ABNORMAL LOW (ref >60)
Glucose, Bld: 161 mg/dL — ABNORMAL HIGH (ref 70–99)
Potassium: 6.4 mmol/L (ref 3.5–5.1)
Sodium: 136 mmol/L (ref 135–145)
Total Bilirubin: 0.8 mg/dL (ref 0.3–1.2)
Total Protein: 6.4 g/dL — ABNORMAL LOW (ref 6.5–8.1)

## 2020-11-23 LAB — LACTIC ACID, PLASMA: Lactic Acid, Venous: 1.2 mmol/L (ref 0.5–1.9)

## 2020-11-23 NOTE — ED Triage Notes (Signed)
Pt brought by ems for hematuria.  Pt is not able to articulate why he is in the ed. Pt is alert to self only at this time. Per ems pt from home with "smelly urine, back pain, hematuria, urgency". Per ems granddaughter is coming to ed for history.

## 2020-11-24 ENCOUNTER — Inpatient Hospital Stay
Admission: EM | Admit: 2020-11-24 | Discharge: 2020-11-28 | DRG: 871 | Disposition: A | Discharge status: Home Health | Payer: Medicare HMO | Attending: Internal Medicine | Admitting: Internal Medicine

## 2020-11-24 ENCOUNTER — Other Ambulatory Visit: Payer: Self-pay

## 2020-11-24 ENCOUNTER — Emergency Department: Payer: Medicare HMO

## 2020-11-24 DIAGNOSIS — E1151 Type 2 diabetes mellitus with diabetic peripheral angiopathy without gangrene: Secondary | ICD-10-CM | POA: Diagnosis present

## 2020-11-24 DIAGNOSIS — R410 Disorientation, unspecified: Secondary | ICD-10-CM

## 2020-11-24 DIAGNOSIS — Z20822 Contact with and (suspected) exposure to covid-19: Secondary | ICD-10-CM | POA: Diagnosis present

## 2020-11-24 DIAGNOSIS — N400 Enlarged prostate without lower urinary tract symptoms: Secondary | ICD-10-CM

## 2020-11-24 DIAGNOSIS — N390 Urinary tract infection, site not specified: Secondary | ICD-10-CM | POA: Diagnosis not present

## 2020-11-24 DIAGNOSIS — I129 Hypertensive chronic kidney disease with stage 1 through stage 4 chronic kidney disease, or unspecified chronic kidney disease: Secondary | ICD-10-CM | POA: Diagnosis present

## 2020-11-24 DIAGNOSIS — E669 Obesity, unspecified: Secondary | ICD-10-CM | POA: Diagnosis present

## 2020-11-24 DIAGNOSIS — E875 Hyperkalemia: Secondary | ICD-10-CM

## 2020-11-24 DIAGNOSIS — I714 Abdominal aortic aneurysm, without rupture, unspecified: Secondary | ICD-10-CM | POA: Diagnosis present

## 2020-11-24 DIAGNOSIS — N179 Acute kidney failure, unspecified: Secondary | ICD-10-CM | POA: Diagnosis present

## 2020-11-24 DIAGNOSIS — A419 Sepsis, unspecified organism: Secondary | ICD-10-CM | POA: Diagnosis present

## 2020-11-24 DIAGNOSIS — G9341 Metabolic encephalopathy: Secondary | ICD-10-CM | POA: Diagnosis present

## 2020-11-24 DIAGNOSIS — M109 Gout, unspecified: Secondary | ICD-10-CM | POA: Diagnosis present

## 2020-11-24 DIAGNOSIS — M1990 Unspecified osteoarthritis, unspecified site: Secondary | ICD-10-CM | POA: Diagnosis present

## 2020-11-24 DIAGNOSIS — J189 Pneumonia, unspecified organism: Secondary | ICD-10-CM

## 2020-11-24 DIAGNOSIS — I701 Atherosclerosis of renal artery: Secondary | ICD-10-CM | POA: Diagnosis present

## 2020-11-24 DIAGNOSIS — R6520 Severe sepsis without septic shock: Secondary | ICD-10-CM

## 2020-11-24 DIAGNOSIS — F0390 Unspecified dementia without behavioral disturbance: Secondary | ICD-10-CM | POA: Diagnosis present

## 2020-11-24 DIAGNOSIS — I959 Hypotension, unspecified: Secondary | ICD-10-CM | POA: Diagnosis present

## 2020-11-24 DIAGNOSIS — N3001 Acute cystitis with hematuria: Secondary | ICD-10-CM

## 2020-11-24 DIAGNOSIS — R319 Hematuria, unspecified: Secondary | ICD-10-CM

## 2020-11-24 DIAGNOSIS — N189 Chronic kidney disease, unspecified: Secondary | ICD-10-CM

## 2020-11-24 DIAGNOSIS — E1122 Type 2 diabetes mellitus with diabetic chronic kidney disease: Secondary | ICD-10-CM | POA: Diagnosis present

## 2020-11-24 DIAGNOSIS — I10 Essential (primary) hypertension: Secondary | ICD-10-CM | POA: Diagnosis not present

## 2020-11-24 DIAGNOSIS — F05 Delirium due to known physiological condition: Secondary | ICD-10-CM | POA: Diagnosis present

## 2020-11-24 DIAGNOSIS — B9620 Unspecified Escherichia coli [E. coli] as the cause of diseases classified elsewhere: Secondary | ICD-10-CM | POA: Diagnosis present

## 2020-11-24 DIAGNOSIS — I2510 Atherosclerotic heart disease of native coronary artery without angina pectoris: Secondary | ICD-10-CM | POA: Diagnosis present

## 2020-11-24 DIAGNOSIS — N1832 Chronic kidney disease, stage 3b: Secondary | ICD-10-CM | POA: Diagnosis present

## 2020-11-24 DIAGNOSIS — H9190 Unspecified hearing loss, unspecified ear: Secondary | ICD-10-CM | POA: Diagnosis present

## 2020-11-24 DIAGNOSIS — K219 Gastro-esophageal reflux disease without esophagitis: Secondary | ICD-10-CM | POA: Diagnosis present

## 2020-11-24 DIAGNOSIS — E119 Type 2 diabetes mellitus without complications: Secondary | ICD-10-CM

## 2020-11-24 DIAGNOSIS — I739 Peripheral vascular disease, unspecified: Secondary | ICD-10-CM | POA: Diagnosis present

## 2020-11-24 HISTORY — DX: Sepsis, unspecified organism: A41.9

## 2020-11-24 HISTORY — DX: Severe sepsis without septic shock: R65.20

## 2020-11-24 LAB — URINALYSIS, COMPLETE (UACMP) WITH MICROSCOPIC
Bilirubin Urine: NEGATIVE
Glucose, UA: NEGATIVE mg/dL
Ketones, ur: NEGATIVE mg/dL
Nitrite: NEGATIVE
Protein, ur: 30 mg/dL — AB
RBC / HPF: >50 RBC/hpf — ABNORMAL HIGH (ref 0–5)
Specific Gravity, Urine: 1.017 (ref 1.005–1.030)
WBC, UA: >50 WBC/hpf — ABNORMAL HIGH (ref 0–5)
pH: 5 (ref 5.0–8.0)

## 2020-11-24 LAB — CREATININE, SERUM
Creatinine, Ser: 3.49 mg/dL — ABNORMAL HIGH (ref 0.61–1.24)
GFR, Estimated: 17 mL/min — ABNORMAL LOW (ref >60)

## 2020-11-24 LAB — PROTIME-INR
INR: 1.3 — ABNORMAL HIGH (ref 0.8–1.2)
INR: 1.3 — ABNORMAL HIGH (ref 0.8–1.2)
Prothrombin Time: 15.9 seconds — ABNORMAL HIGH (ref 11.4–15.2)
Prothrombin Time: 16.2 seconds — ABNORMAL HIGH (ref 11.4–15.2)

## 2020-11-24 LAB — CBG MONITORING, ED
Glucose-Capillary: 136 mg/dL — ABNORMAL HIGH (ref 70–99)
Glucose-Capillary: 102 mg/dL — ABNORMAL HIGH (ref 70–99)
Glucose-Capillary: 93 mg/dL (ref 70–99)
Glucose-Capillary: 98 mg/dL (ref 70–99)

## 2020-11-24 LAB — CBC
HCT: 35.1 % — ABNORMAL LOW (ref 39.0–52.0)
HCT: 38.6 % — ABNORMAL LOW (ref 39.0–52.0)
Hemoglobin: 11.4 g/dL — ABNORMAL LOW (ref 13.0–17.0)
Hemoglobin: 12.3 g/dL — ABNORMAL LOW (ref 13.0–17.0)
MCH: 28.8 pg (ref 26.0–34.0)
MCH: 28.8 pg (ref 26.0–34.0)
MCHC: 32.5 g/dL (ref 30.0–36.0)
MCHC: 31.9 g/dL (ref 30.0–36.0)
MCV: 88.6 fL (ref 80.0–100.0)
MCV: 90.4 fL (ref 80.0–100.0)
Platelets: 94 10*3/uL — ABNORMAL LOW (ref 150–400)
Platelets: 107 10*3/uL — ABNORMAL LOW (ref 150–400)
RBC: 3.96 MIL/uL — ABNORMAL LOW (ref 4.22–5.81)
RBC: 4.27 MIL/uL (ref 4.22–5.81)
RDW: 14.5 % (ref 11.5–15.5)
RDW: 14.6 % (ref 11.5–15.5)
WBC: 18.8 10*3/uL — ABNORMAL HIGH (ref 4.0–10.5)
WBC: 19.5 10*3/uL — ABNORMAL HIGH (ref 4.0–10.5)
nRBC: 0 % (ref 0.0–0.2)
nRBC: 0 % (ref 0.0–0.2)

## 2020-11-24 LAB — PROCALCITONIN: Procalcitonin: 0.43 ng/mL

## 2020-11-24 LAB — RESP PANEL BY RT-PCR (FLU A&B, COVID) ARPGX2
Influenza A by PCR: NEGATIVE
Influenza B by PCR: NEGATIVE
SARS Coronavirus 2 by RT PCR: NEGATIVE

## 2020-11-24 LAB — POTASSIUM: Potassium: 5.6 mmol/L — ABNORMAL HIGH (ref 3.5–5.1)

## 2020-11-24 LAB — APTT: aPTT: 33 seconds (ref 24–36)

## 2020-11-24 LAB — CORTISOL-AM, BLOOD: Cortisol - AM: 28.4 ug/dL — ABNORMAL HIGH (ref 6.7–22.6)

## 2020-11-24 LAB — LACTIC ACID, PLASMA: Lactic Acid, Venous: 1.2 mmol/L (ref 0.5–1.9)

## 2020-11-24 MED ORDER — LACTATED RINGERS IV BOLUS
1000.0000 mL | Freq: Once | INTRAVENOUS | Status: AC
  Administered 2020-11-24: 1000 mL via INTRAVENOUS

## 2020-11-24 MED ORDER — HALOPERIDOL LACTATE 5 MG/ML IJ SOLN
1.5000 mg | Freq: Once | INTRAMUSCULAR | Status: AC
  Administered 2020-11-24: 1.5 mg via INTRAVENOUS
  Filled 2020-11-24: qty 1

## 2020-11-24 MED ORDER — ENOXAPARIN SODIUM 30 MG/0.3ML IJ SOSY
30.0000 mg | PREFILLED_SYRINGE | INTRAMUSCULAR | Status: DC
  Administered 2020-11-24 – 2020-11-26 (×3): 30 mg via SUBCUTANEOUS
  Filled 2020-11-24 (×4): qty 0.3

## 2020-11-24 MED ORDER — ACETAMINOPHEN 650 MG RE SUPP: 650.0000 mg | Freq: Four times a day (QID) | RECTAL | Status: DC | PRN

## 2020-11-24 MED ORDER — SODIUM CHLORIDE 0.9 % IV SOLN
1.0000 g | INTRAVENOUS | Status: DC
  Administered 2020-11-24 – 2020-11-25 (×2): 1 g via INTRAVENOUS
  Filled 2020-11-24 (×2): qty 10
  Filled 2020-11-24: qty 1

## 2020-11-24 MED ORDER — ALBUTEROL SULFATE (2.5 MG/3ML) 0.083% IN NEBU
10.0000 mg | INHALATION_SOLUTION | Freq: Once | RESPIRATORY_TRACT | Status: AC
  Administered 2020-11-24: 10 mg via RESPIRATORY_TRACT
  Filled 2020-11-24: qty 12

## 2020-11-24 MED ORDER — SODIUM BICARBONATE 8.4 % IV SOLN
50.0000 meq | Freq: Once | INTRAVENOUS | Status: AC
  Administered 2020-11-24: 50 meq via INTRAVENOUS
  Filled 2020-11-24: qty 50

## 2020-11-24 MED ORDER — ACETAMINOPHEN 325 MG PO TABS: 650.0000 mg | ORAL_TABLET | Freq: Four times a day (QID) | ORAL | Status: DC | PRN

## 2020-11-24 MED ORDER — HALOPERIDOL LACTATE 5 MG/ML IJ SOLN
1.0000 mg | Freq: Four times a day (QID) | INTRAMUSCULAR | Status: DC | PRN
Start: 2020-11-24 — End: 2020-11-24
  Administered 2020-11-24: 1 mg via INTRAVENOUS
  Filled 2020-11-24: qty 1

## 2020-11-24 MED ORDER — INSULIN ASPART 100 UNIT/ML IV SOLN
10.0000 [IU] | Freq: Once | INTRAVENOUS | Status: AC
  Administered 2020-11-24: 10 [IU] via INTRAVENOUS
  Filled 2020-11-24: qty 0.1

## 2020-11-24 MED ORDER — SODIUM ZIRCONIUM CYCLOSILICATE 10 G PO PACK
10.0000 g | PACK | ORAL | Status: AC
  Administered 2020-11-24: 10 g via ORAL
  Filled 2020-11-24: qty 1

## 2020-11-24 MED ORDER — LACTATED RINGERS IV BOLUS (SEPSIS)
1000.0000 mL | Freq: Once | INTRAVENOUS | Status: AC
  Administered 2020-11-24: 1000 mL via INTRAVENOUS

## 2020-11-24 MED ORDER — INSULIN ASPART 100 UNIT/ML IV SOLN
5.0000 [IU] | Freq: Once | INTRAVENOUS | Status: AC
  Administered 2020-11-24: 5 [IU] via INTRAVENOUS
  Filled 2020-11-24: qty 0.05

## 2020-11-24 MED ORDER — LACTATED RINGERS IV SOLN: INTRAVENOUS | Status: DC

## 2020-11-24 MED ORDER — DEXTROSE 50 % IV SOLN
25.0000 g | Freq: Once | INTRAVENOUS | Status: AC
  Administered 2020-11-24: 25 g via INTRAVENOUS
  Filled 2020-11-24: qty 50

## 2020-11-24 MED ORDER — DROPERIDOL 2.5 MG/ML IJ SOLN
2.5000 mg | Freq: Once | INTRAMUSCULAR | Status: AC
  Administered 2020-11-24: 2.5 mg via INTRAVENOUS
  Filled 2020-11-24: qty 2

## 2020-11-24 MED ORDER — CALCIUM GLUCONATE-NACL 1-0.675 GM/50ML-% IV SOLN
1.0000 g | Freq: Once | INTRAVENOUS | Status: AC
  Administered 2020-11-24: 1000 mg via INTRAVENOUS
  Filled 2020-11-24: qty 50

## 2020-11-24 MED ORDER — HALOPERIDOL LACTATE 5 MG/ML IJ SOLN
2.0000 mg | Freq: Four times a day (QID) | INTRAMUSCULAR | Status: DC | PRN
  Administered 2020-11-25 – 2020-11-27 (×4): 2 mg via INTRAVENOUS
  Filled 2020-11-24 (×4): qty 1

## 2020-11-24 MED ORDER — ONDANSETRON HCL 4 MG PO TABS: 4.0000 mg | ORAL_TABLET | Freq: Four times a day (QID) | ORAL | Status: DC | PRN

## 2020-11-24 MED ORDER — QUETIAPINE FUMARATE 25 MG PO TABS
12.5000 mg | ORAL_TABLET | Freq: Every day | ORAL | Status: DC
  Administered 2020-11-24 – 2020-11-27 (×4): 12.5 mg via ORAL
  Filled 2020-11-24 (×4): qty 1

## 2020-11-24 MED ORDER — INSULIN ASPART 100 UNIT/ML IJ SOLN
0.0000 [IU] | INTRAMUSCULAR | Status: DC
  Administered 2020-11-24: 2 [IU] via SUBCUTANEOUS
  Filled 2020-11-24: qty 1

## 2020-11-24 MED ORDER — ONDANSETRON HCL 4 MG/2ML IJ SOLN: 4.0000 mg | Freq: Four times a day (QID) | INTRAMUSCULAR | Status: DC | PRN

## 2020-11-24 MED ORDER — SODIUM CHLORIDE 0.9 % IV SOLN
2.0000 g | Freq: Once | INTRAVENOUS | Status: AC
  Administered 2020-11-24: 2 g via INTRAVENOUS
  Filled 2020-11-24: qty 2

## 2020-11-24 MED ORDER — DEXTROSE 50 % IV SOLN
1.0000 | Freq: Once | INTRAVENOUS | Status: AC
  Administered 2020-11-24: 50 mL via INTRAVENOUS
  Filled 2020-11-24: qty 50

## 2020-11-24 NOTE — H&P (Signed)
History and Physical    Robert Conrad ELF:[id number] DOB: [birth year] DOA: 11/24/2020  PCP: Albina Billet, MD   Patient coming from: home  I have personally briefly reviewed patient's old medical records in [place]  Chief Complaint: AMS, fever, foul odor to urine  HPI: Robert Conrad is a 80 y.o. male with medical history significant for DM, HTN, PAD, AAA, renal artery stenosis with CKD 4, suspected dementia, and history of CABG at age 82 who was brought to the emergency room with a several day history of altered mental status and increasing confusion, fever and foul-smelling urine.  History is limited due to altered mental status and is taken from daughter at bedside and ED records.  ED course: On arrival febrile at 100.9 with pulse 92 BP 100/50 respirations 36 with O2 sat 94% on room air Blood work with leukocytosis of 21,000, lactic acid 1.2/1.2.  CMP significant for creatinine of 4.11 up from baseline of 3.17 with potassium of 6.4, normal bicarb of 23.  Urinalysis with large leukocyte x-rays few bacteria and greater than 50 WBC per hpf  EKG, personally viewed and interpreted: Sinus rhythm at 93 with nonspecific ST-T wave changes  Imaging: CT abdomen and pelvis nonacute.  4.2 cm infrarenal AAA Chest x-ray: No active disease  Patient started on sepsis fluids and IV antibiotics.   Hyperkalemia was treated with insulin and dextrose Hospitalist consulted for admission.  Review of Systems: Unable to obtain due to altered mental status and dementia   Past Medical History:  Diagnosis Date   Abdominal aneurysm without mention of rupture    Arthritis    Bowel trouble 1999   Diabetes mellitus without complication ([place]) 5852   non insulin dependent   Diverticulitis    Eye problems 2010   GERD (gastroesophageal reflux disease) 2009   Gout 2009   Hearing loss    Heart disease 1990   Hyperlipidemia 2009   Hypertension 2009   Obesity, unspecified    Personal history  of colonic polyps    Personal history of tobacco use, presenting hazards to health    Renal insufficiency    Special screening for malignant neoplasms, colon    Stroke (cerebrum) ([place])    Stroke ([place]) 2009   Ulcer 2009   stomach    Past Surgical History:  Procedure Laterality Date   CARDIAC CATHETERIZATION  09-30-13   COLONOSCOPY  2009   COLONOSCOPY  03-07-11   Dr Jamal Collin   COLONOSCOPY  03/07/2011   COLONOSCOPY WITH PROPOFOL N/A 07/16/2016   Procedure: COLONOSCOPY WITH PROPOFOL;  Surgeon: Christene Lye, MD;  Location: [facility] ENDOSCOPY;  Service: Endoscopy;  Laterality: N/A;   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 His smoking use included cigarettes. He has never used smokeless tobacco. He reports that he does not drink alcohol and does not use drugs.  Allergies  Allergen Reactions   Ciprofloxacin Hcl Rash    Family History  Problem Relation Age of Onset   Cancer Mother    Heart attack Father       Prior to Admission medications   Medication Sig Start Date End Date Taking? Authorizing Provider  amLODipine (NORVASC) 5 MG tablet Take 1 tablet by  mouth daily. 03/28/19   [provider]  aspirin 81 MG tablet Take 81 mg by mouth daily.    [provider]  metoprolol succinate (TOPROL-XL) 25 MG 24 hr tablet Take 25 mg by mouth 2 (two) times daily.  02/05/13   [provider]  Multiple Vitamin (MULTIVITAMIN) tablet Take 1 tablet by mouth daily.    [provider]  Multiple Vitamins-Minerals (PRESERVISION AREDS 2) CAPS Take 1 capsule by mouth daily. Patient not taking: Reported on 12/01/2019    [provider]  omeprazole (PRILOSEC) 20 MG capsule Take 20 mg by mouth daily.  02/02/13   [provider]  simvastatin (ZOCOR) 20 MG tablet Take 20  mg by mouth daily.  02/16/13   [provider]  TOUJEO SOLOSTAR 300 UNIT/ML SOPN Inject 18 Units into the skin daily. 12/10/18   Nicholes Mango, MD  vitamin B-12 (CYANOCOBALAMIN) 1000 MCG tablet Take 1 tablet (1,000 mcg total) by mouth daily. Patient not taking: Reported on 12/01/2019 12/10/18   Nicholes Mango, MD    Physical Exam: Vitals:   11/24/20 0130 11/24/20 0145 11/24/20 0200 11/24/20 0215  BP:  (!) 125/54    Pulse: 90 93 96 98  Resp: (!) 21 (!) 31 18 19   Temp:      TempSrc:      SpO2: 95% 98% 97% 98%  Weight:      Height:         Vitals:   11/24/20 0130 11/24/20 0145 11/24/20 0200 11/24/20 0215  BP:  (!) 125/54    Pulse: 90 93 96 98  Resp: (!) 21 (!) 31 18 19   Temp:      TempSrc:      SpO2: 95% 98% 97% 98%  Weight:      Height:          Constitutional: Lethargic. Not in any apparent distress HEENT:      Head: Normocephalic and atraumatic.         Eyes: PERLA, EOMI, Conjunctivae are normal. Sclera is non-icteric.       Mouth/Throat: Mucous membranes are moist.       Neck: Supple with no signs of meningismus. Cardiovascular: Tachycardic. No murmurs, gallops, or rubs. 2+ symmetrical distal pulses are present . No JVD. No LE edema Respiratory: Respiratory effort normal .Lungs sounds clear bilaterally. No wheezes, crackles, or rhonchi.  Gastrointestinal: Soft, non tender, and non distended with positive bowel sounds.  Genitourinary: No CVA tenderness. Musculoskeletal: Nontender with normal range of motion in all extremities. No cyanosis, or erythema of extremities. Neurologic:  Face is symmetric. Moving all extremities. No gross focal neurologic deficits . Skin: Skin is warm, dry.  No rash or ulcers Psychiatric: Difficult to assess due to lethargy   Labs on Admission: I have personally reviewed following labs and imaging studies  CBC: Recent Labs  Lab 11/23/20 2246  WBC 21.8*  NEUTROABS 17.2*  HGB 13.6  HCT 42.7  MCV 89.7  PLT 277*   Basic Metabolic  Panel: Recent Labs  Lab 11/23/20 2246  NA 136  K 6.4*  CL 107  CO2 23  GLUCOSE 161*  BUN 76*  CREATININE 4.11*  CALCIUM 9.0   GFR: Estimated Creatinine Clearance: 15.4 mL/min (A) (by C-G formula based on SCr of 4.11 mg/dL (H)). Liver Function Tests: Recent Labs  Lab 11/23/20 2246  AST 17  ALT 26  ALKPHOS 38  BILITOT 0.8  PROT 6.4*  ALBUMIN 3.3*   No results for input(s): LIPASE, AMYLASE  in the last 168 hours. Recent Labs  Lab 11/19/20 1615  AMMONIA 11   Coagulation Profile: Recent Labs  Lab 11/24/20 0047  INR 1.3*   Cardiac Enzymes: No results for input(s): CKTOTAL, CKMB, CKMBINDEX, TROPONINI in the last 168 hours. BNP (last 3 results) No results for input(s): PROBNP in the last 8760 hours. HbA1C: No results for input(s): HGBA1C in the last 72 hours. CBG: No results for input(s): GLUCAP in the last 168 hours. Lipid Profile: No results for input(s): CHOL, HDL, LDLCALC, TRIG, CHOLHDL, LDLDIRECT in the last 72 hours. Thyroid Function Tests: No results for input(s): TSH, T4TOTAL, FREET4, T3FREE, THYROIDAB in the last 72 hours. Anemia Panel: No results for input(s): VITAMINB12, FOLATE, FERRITIN, TIBC, IRON, RETICCTPCT in the last 72 hours. Urine analysis:    Component Value Date/Time   COLORURINE YELLOW (A) 11/24/2020 0041   APPEARANCEUR HAZY (A) 11/24/2020 0041   APPEARANCEUR Clear 03/18/2013 1107   LABSPEC 1.017 11/24/2020 0041   LABSPEC 1.004 03/18/2013 1107   PHURINE 5.0 11/24/2020 0041   GLUCOSEU NEGATIVE 11/24/2020 0041   GLUCOSEU 50 mg/dL 03/18/2013 1107   HGBUR LARGE (A) 11/24/2020 0041   BILIRUBINUR NEGATIVE 11/24/2020 0041   BILIRUBINUR Negative 03/18/2013 1107   KETONESUR NEGATIVE 11/24/2020 0041   PROTEINUR 30 (A) 11/24/2020 0041   NITRITE NEGATIVE 11/24/2020 0041   LEUKOCYTESUR LARGE (A) 11/24/2020 0041   LEUKOCYTESUR Negative 03/18/2013 1107    Radiological Exams on Admission: DG Chest Port 1 View  Result Date: 11/23/2020 CLINICAL  DATA:  80 year old male with hematuria. EXAM: PORTABLE CHEST 1 VIEW COMPARISON:  Chest radiograph dated 10/26/2020. FINDINGS: No focal consolidation pleural effusion, pneumothorax. The cardiac silhouette is within limits. Atherosclerotic calcification the aorta. Median sternotomy wires. No acute osseous pathology. IMPRESSION: No active cardiopulmonary disease. Electronically Signed   By: Anner Crete M.D.   On: 11/23/2020 23:37   CT Renal Stone Study  Result Date: 11/24/2020 CLINICAL DATA:  Sepsis and renal failure EXAM: CT ABDOMEN AND PELVIS WITHOUT CONTRAST TECHNIQUE: Multidetector CT imaging of the abdomen and pelvis was performed following the standard protocol without IV contrast. COMPARISON:  01/01/2015 FINDINGS: LOWER CHEST: Normal. HEPATOBILIARY: Normal hepatic contours. No intra- or extrahepatic biliary dilatation. There is cholelithiasis without acute inflammation. PANCREAS: Normal pancreas. No ductal dilatation or peripancreatic fluid collection. SPLEEN: Normal. ADRENALS/URINARY TRACT: There is a right adrenal lesion measuring 14 mm with an attenuation of -1 HU. The other adrenal gland is normal. No hydronephrosis, nephroureterolithiasis or solid renal mass. The urinary bladder is normal for degree of distention STOMACH/BOWEL: There is no hiatal hernia. Normal duodenal course and caliber. No small bowel dilatation or inflammation. No focal colonic abnormality. Normal appendix. VASCULAR/LYMPHATIC: Infrarenal abdominal aortic aneurysm measures 4.2 cm. REPRODUCTIVE: Prostate is enlarged, measuring 5.7 cm MUSCULOSKELETAL. No bony spinal canal stenosis or focal osseous abnormality. OTHER: None. IMPRESSION: 1. No acute abnormality of the abdomen or pelvis. 2. Cholelithiasis without acute inflammation. 3. Right adrenal adenoma, unchanged. 4. 4.2 cm infrarenal abdominal aortic aneurysm. 5. Enlarged prostate. Electronically Signed   By: Ulyses Jarred M.D.   On: 11/24/2020 02:06      Assessment/Plan 80 year old male with history of DM, HTN, PAD, AAA, renal artery stenosis with CKD 4, suspected dementia who was brought to the emergency room with a couple day history of altered mental status and confusion, fever and foul-smelling urine.    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HCC)   Right renal artery stenosis - Creatinine 4.11 up from baseline of 3.17 - Continue IV hydration and monitor and avoid nephrotoxins  History of CABG --No complaints of chest pain    Abdominal aortic aneurysm (HCC) - CT abdomen and pelvis showing 4.2 AAA infrarenal    PAD (peripheral artery disease) (HCC) - Continue antiplatelet and statins    Diabetes (HCC) - Sliding scale insulin coverage    Essential hypertension -Hold home antihypertensives due to soft blood pressures and resume as appropriate    DVT prophylaxis: Lovenox  Code Status: full code (daughter to discuss code status with mother) Family Communication:  Daughter at bedside  Disposition Plan: Back to previous home environment Consults called: none  Status:At the time of admission, it appears that the appropriate admission status for this patient is INPATIENT. This is judged to be reasonable and necessary in order to provide the required intensity of service to ensure the patient's safety given the presenting symptoms, physical exam findings, and initial radiographic and laboratory data in the context of their  Comorbid  conditions.   Patient requires inpatient status due to high intensity of service, high risk for further deterioration and high frequency of surveillance required.   I certify that at the point of admission it is my clinical judgment that the patient will require inpatient hospital care spanning beyond [place] MD [facility]     11/24/2020, 2:46 AM

## 2020-11-24 NOTE — ED Notes (Signed)
ED Provider at bedside. 

## 2020-11-24 NOTE — [place] Notes (Signed)
PHARMACY -  BRIEF ANTIBIOTIC NOTE   Pharmacy has received consult(s) for Cefepime from an ED provider.  The patient's profile has been reviewed for ht/wt/allergies/indication/available labs.    One time order(s) placed for Cefepime 2 gm IV X 1  Further antibiotics/pharmacy consults should be ordered by admitting physician if indicated.                       Thank you, Ayeisha Lindenberger D 11/24/2020  1:01 AM

## 2020-11-24 NOTE — [place] Notes (Signed)
CODE SEPSIS - PHARMACY COMMUNICATION  **Broad Spectrum Antibiotics should be administered within 1 hour of Sepsis diagnosis**  Time Code Sepsis Called/Page Received: 7/23 @ 0047  Antibiotics Ordered: Cefepime 2 gm   Time of 1st antibiotic administration: Cefepime 2 gm IV X 1   7/23 @ 0139  Additional action taken by pharmacy:   If necessary, Name of Provider/Nurse Contacted:     Chayil Gantt D ,PharmD Clinical Pharmacist  11/24/2020  3:08 AM

## 2020-11-24 NOTE — ED Notes (Addendum)
Pt is very restless. Family at bedside asked if we could give him something to make him rest more. Will give PRN haldol.

## 2020-11-24 NOTE — [place] Notes (Signed)
SLP Cancellation Note  Patient Details Name: Robert Conrad MRN: [id number] DOB: Oct 01, 1940   Cancelled treatment:       Reason Eval/Treat Not Completed: Patient not medically ready. Spoke with RN; pt not alert; when awake he is confused, agitated, and swinging at staff. Will hold swallow evaluation for now; RN to call if alert and appropriate for evaluation. [phone].  Deneise Lever, [place], CCC-SLP Speech-Language Pathologist    Aliene Altes 11/24/2020, 10:50 AM

## 2020-11-24 NOTE — Progress Notes (Signed)
Patient ID: Robert Conrad, male   DOB: 1940-07-30, 80 y.o.   MRN: [id number] [facility] PROGRESS NOTE  Robert Conrad MWU:[id number] DOB: June 10, 1940 DOA: 11/24/2020 PCP: Albina Billet, MD  HPI/Subjective: Patient was able to be awakened but easily went back to sleep.  Daughter states that they did not have any a tough time with him at home and sometimes he can stay up for a few days.  Admitted with altered mental status fever and found to have urinary tract infection  Objective: Vitals:   11/24/20 1400 11/24/20 1430  BP: (!) 117/49 (!) 99/59  Pulse: 95 87  Resp: (!) 32 (!) 31  Temp:    SpO2: 98% 94%    Intake/Output Summary (Last 24 hours) at 11/24/2020 1500 Last data filed at 11/24/2020 1419 Gross per 24 hour  Intake 2094.52 ml  Output 700 ml  Net 1394.52 ml   Filed Weights   11/23/20 2239  Weight: 90 kg    ROS: Review of Systems  Respiratory:  Negative for shortness of breath.   Cardiovascular:  Negative for chest pain.  Gastrointestinal:  Negative for abdominal pain, nausea and vomiting.  Exam: Physical Exam HENT:     Head: Normocephalic.     Mouth/Throat:     Pharynx: No oropharyngeal exudate.  Eyes:     General: Lids are normal.     Conjunctiva/sclera: Conjunctivae normal.  Cardiovascular:     Rate and Rhythm: Normal rate and regular rhythm.     Heart sounds: Normal heart sounds, S1 normal and S2 normal.  Pulmonary:     Breath sounds: Normal breath sounds. No decreased breath sounds, wheezing, rhonchi or rales.  Abdominal:     Palpations: Abdomen is soft.     Tenderness: There is no abdominal tenderness.  Musculoskeletal:     Right lower leg: Swelling present.     Left lower leg: Swelling present.  Skin:    General: Skin is warm.     Findings: No rash.  Neurological:     Mental Status: He is disoriented and confused.     Data Reviewed: Basic Metabolic Panel: Recent Labs  Lab 11/23/20 2246 11/24/20 0433  NA 136  --   K 6.4* 5.6*   CL 107  --   CO2 23  --   GLUCOSE 161*  --   BUN 76*  --   CREATININE 4.11* 3.49*  CALCIUM 9.0  --    Liver Function Tests: Recent Labs  Lab 11/23/20 2246  AST 17  ALT 26  ALKPHOS 38  BILITOT 0.8  PROT 6.4*  ALBUMIN 3.3*   Recent Labs  Lab 11/19/20 1615  AMMONIA 11   CBC: Recent Labs  Lab 11/23/20 2246 11/24/20 0327 11/24/20 0433  WBC 21.8* 18.8* 19.5*  NEUTROABS 17.2*  --   --   HGB 13.6 11.4* 12.3*  HCT 42.7 35.1* 38.6*  MCV 89.7 88.6 90.4  PLT 127* 94* 107*    CBG: Recent Labs  Lab 11/24/20 0438 11/24/20 0848 11/24/20 1450  GLUCAP 136* 102* 93    Recent Results (from the past 240 hour(s))  Blood Culture (routine x 2)     Status: None (Preliminary result)   Collection Time: 11/24/20 12:41 AM   Specimen: BLOOD  Result Value Ref Range Status   Specimen Description BLOOD RIGHT ANTECUBITAL  Final   Special Requests   Final    BOTTLES DRAWN AEROBIC AND ANAEROBIC Blood Culture results may not be optimal due to an excessive  volume of blood received in culture bottles   Culture   Final    NO GROWTH < 12 HOURS Performed at [facility], [address] [place], [place] [address]    Report Status PENDING  Incomplete  Resp Panel by RT-PCR (Flu A&B, Covid) Nasopharyngeal Swab     Status: None   Collection Time: 11/24/20 12:47 AM   Specimen: Nasopharyngeal Swab; Nasopharyngeal(NP) swabs in vial transport medium  Result Value Ref Range Status   SARS Coronavirus 2 by RT PCR NEGATIVE NEGATIVE Final    Comment: (NOTE) SARS-CoV-2 target nucleic acids are NOT DETECTED.  The SARS-CoV-2 RNA is generally detectable in upper respiratory specimens during the acute phase of infection. The lowest concentration of SARS-CoV-2 viral copies this assay can detect is 138 copies/mL. A negative result does not preclude SARS-Cov-2 infection and should not be used as the sole basis for treatment or other patient management decisions. A negative result may occur with   improper specimen collection/handling, submission of specimen other than nasopharyngeal swab, presence of viral mutation(s) within the areas targeted by this assay, and inadequate number of viral copies(<138 copies/mL). A negative result must be combined with clinical observations, patient history, and epidemiological information. The expected result is Negative.  Fact Sheet for Patients:  EntrepreneurPulse.com.au  Fact Sheet for Healthcare Providers:  IncredibleEmployment.be  This test is no t yet approved or cleared by the [place] FDA and  has been authorized for detection and/or diagnosis of SARS-CoV-2 by FDA under an Emergency Use Authorization (EUA). This EUA will remain  in effect (meaning this test can be used) for the duration of the COVID-19 declaration under Section 564(b)(1) of the Act, 21 U.S.C.section 360bbb-3(b)(1), unless the authorization is terminated  or revoked sooner.       Influenza A by PCR NEGATIVE NEGATIVE Final   Influenza B by PCR NEGATIVE NEGATIVE Final    Comment: (NOTE) The Xpert Xpress SARS-CoV-2/FLU/RSV plus assay is intended as an aid in the diagnosis of influenza from Nasopharyngeal swab specimens and should not be used as a sole basis for treatment. Nasal washings and aspirates are unacceptable for Xpert Xpress SARS-CoV-2/FLU/RSV testing.  Fact Sheet for Patients: EntrepreneurPulse.com.au  Fact Sheet for Healthcare Providers: IncredibleEmployment.be  This test is not yet approved or cleared by the [place] FDA and has been authorized for detection and/or diagnosis of SARS-CoV-2 by FDA under an Emergency Use Authorization (EUA). This EUA will remain in effect (meaning this test can be used) for the duration of the COVID-19 declaration under Section 564(b)(1) of the Act, 21 U.S.C. section 360bbb-3(b)(1), unless the authorization is terminated  or revoked.  Performed at [facility], [address] [place], [place] [address]   Blood Culture (routine x 2)     Status: None (Preliminary result)   Collection Time: 11/24/20  1:08 AM   Specimen: BLOOD  Result Value Ref Range Status   Specimen Description BLOOD BLOOD LEFT HAND  Final   Special Requests   Final    BOTTLES DRAWN AEROBIC AND ANAEROBIC Blood Culture results may not be optimal due to an inadequate volume of blood received in culture bottles   Culture   Final    NO GROWTH < 12 HOURS Performed at [facility], [address], [place], [place] [address]    Report Status PENDING  Incomplete     Studies: DG Chest Port 1 View  Result Date: 11/23/2020 CLINICAL DATA:  80 year old male with hematuria. EXAM: PORTABLE CHEST 1  VIEW COMPARISON:  Chest radiograph dated 10/26/2020. FINDINGS: No focal consolidation pleural effusion, pneumothorax. The cardiac silhouette is within limits. Atherosclerotic calcification the aorta. Median sternotomy wires. No acute osseous pathology. IMPRESSION: No active cardiopulmonary disease. Electronically Signed   By: Anner Crete M.D.   On: 11/23/2020 23:37   CT Renal Stone Study  Result Date: 11/24/2020 CLINICAL DATA:  Sepsis and renal failure EXAM: CT ABDOMEN AND PELVIS WITHOUT CONTRAST TECHNIQUE: Multidetector CT imaging of the abdomen and pelvis was performed following the standard protocol without IV contrast. COMPARISON:  01/01/2015 FINDINGS: LOWER CHEST: Normal. HEPATOBILIARY: Normal hepatic contours. No intra- or extrahepatic biliary dilatation. There is cholelithiasis without acute inflammation. PANCREAS: Normal pancreas. No ductal dilatation or peripancreatic fluid collection. SPLEEN: Normal. ADRENALS/URINARY TRACT: There is a right adrenal lesion measuring 14 mm with an attenuation of -1 HU. The other adrenal gland is normal. No hydronephrosis, nephroureterolithiasis or solid renal mass. The urinary bladder is normal  for degree of distention STOMACH/BOWEL: There is no hiatal hernia. Normal duodenal course and caliber. No small bowel dilatation or inflammation. No focal colonic abnormality. Normal appendix. VASCULAR/LYMPHATIC: Infrarenal abdominal aortic aneurysm measures 4.2 cm. REPRODUCTIVE: Prostate is enlarged, measuring 5.7 cm MUSCULOSKELETAL. No bony spinal canal stenosis or focal osseous abnormality. OTHER: None. IMPRESSION: 1. No acute abnormality of the abdomen or pelvis. 2. Cholelithiasis without acute inflammation. 3. Right adrenal adenoma, unchanged. 4. 4.2 cm infrarenal abdominal aortic aneurysm. 5. Enlarged prostate. Electronically Signed   By: Ulyses Jarred M.D.   On: 11/24/2020 02:06    Scheduled Meds:  enoxaparin (LOVENOX) injection  30 mg Subcutaneous Q24H   QUEtiapine  12.5 mg Oral QHS   Continuous Infusions:  cefTRIAXone (ROCEPHIN)  IV Stopped (11/24/20 1419)   lactated ringers      Assessment/Plan:  Severe sepsis, hypotension, acute metabolic encephalopathy, acute kidney injury, leukocytosis, fever, tachycardia, tachypnea.  Present on admission.  Acute cystitis with hematuria. follow-up blood cultures and urine cultures.  Empirically on Rocephin and IV fluids. Acute delirium with likely underlying dementia.  As needed Haldol.  We will try Seroquel at night.  Seen by speech pathology and recommends n.p.o. today Acute kidney injury with chronic kidney disease stage IV.  Creatinine 4.11 on presentation down to 3.49 with IV fluid hydration.  Check bladder scan after urination. Hyperkalemia secondary to acute kidney injury.  Given a dose of Lokelma earlier in the hospital course.  Give sodium bicarb insulin D50 and calcium today.  Recheck BMP tomorrow morning. Essential hypertension hold all antihypertensive medications History of CAD.  Unable to take medications currently. Type 2 diabetes mellitus.  Check sugars and hold on any diabetic medications at this point.  Last sugar 93.   Code  Status:     Code Status Orders  (From admission, onward)           Start     Ordered   11/24/20 0757  Do not attempt resuscitation (DNR)  Continuous       Question Answer Comment  In the event of cardiac or respiratory ARREST Do not call a "code blue"   In the event of cardiac or respiratory ARREST Do not perform Intubation, CPR, defibrillation or ACLS   In the event of cardiac or respiratory ARREST Use medication by any route, position, wound care, and other measures to relive pain and suffering. May use oxygen, suction and manual treatment of airway obstruction as needed for comfort.   Comments nurse may pronounce      11/24/20 [phone]  Code Status History     Date Active Date Inactive Code Status Order ID Comments User Context   11/24/2020 0256 11/24/2020 0756 Full Code [id number]  Athena Masse, MD ED   12/07/2018 1332 12/10/2018 1841 Full Code [id number]  Lang Snow, NP ED      Family Communication: Spoke with daughter at the bedside Disposition Plan: Status is: Inpatient  Dispo: The patient is from: Home              Anticipated d/c is to: Rehab versus home depending on how he does with physical therapy              Patient currently being treated for severe sepsis   Difficult to place patient.  No.  Antibiotics: Rocephin  Time spent: 31 minutes  [place]

## 2020-11-24 NOTE — ED Notes (Signed)
Pt returned from CT, initiating Abx.

## 2020-11-24 NOTE — ED Notes (Signed)
Helene Kelp [phone]

## 2020-11-24 NOTE — ED Notes (Addendum)
Pt to CT will start Abx as soon as pt. Returns to room.

## 2020-11-24 NOTE — ED Provider Notes (Signed)
[facility] Emergency Department Provider Note  ____________________________________________   Event Date/Time   First MD Initiated Contact with Patient 11/24/20 [phone]     (approximate)  I have reviewed the triage vital signs and the nursing notes.   HISTORY  Chief Complaint Hematuria  Level 5 caveat:  history/ROS limited by acute/critical illness.  HPI Robert Conrad is a 80 y.o. male with probable dementia (his daughter reports that he is being worked up by neurology to rule out "everything else" but with presumed dementia) who presents tonight with several days of worsening confusion and abnormal behavior including urinary incontinence.  He is not reporting any specific symptoms such as pain and he denies shortness of breath.  He is alert only to himself.  His family does note that his urine has been malodorous and that within the last day he was complaining of bilateral low back pain, but he currently denies the back pain.  The symptoms are severe and nothing in particular makes them better or worse and it has been relatively acute over the last couple of days.     Past Medical History:  Diagnosis Date   Abdominal aneurysm without mention of rupture    Arthritis    Bowel trouble 1999   Diabetes mellitus without complication ([place]) 5409   non insulin dependent   Diverticulitis    Eye problems 2010   GERD (gastroesophageal reflux disease) 2009   Gout 2009   Hearing loss    Heart disease 1990   Hyperlipidemia 2009   Hypertension 2009   Obesity, unspecified    Personal history of colonic polyps    Personal history of tobacco use, presenting hazards to health    Renal insufficiency    Special screening for malignant neoplasms, colon    Stroke (cerebrum) ([place])    Stroke ([place]) 2009   Ulcer 2009   stomach    Patient Active Problem List   Diagnosis Date Noted   Hyperkalemia 11/24/2020   UTI (urinary tract infection) 81/19/1478   Acute  metabolic encephalopathy 29/56/2130   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[place]) 11/24/2020   Sepsis ([place]) 11/24/2020   Severe sepsis ([place]) 11/24/2020   Altered mental status 12/07/2018   PAD (peripheral artery disease) ([place]) 12/17/2016   Diabetes ([place]) 12/17/2016   Essential hypertension 12/17/2016   Hyperlipidemia 12/17/2016   Renal artery stenosis ([place]) 10/05/2013   CAD (coronary artery disease), 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/27/2013   History of colon polyps 02/24/2013   Abdominal aortic aneurysm ([place]) 02/24/2013    Past Surgical History:  Procedure Laterality Date   CARDIAC CATHETERIZATION  09-30-13   COLONOSCOPY  2009   COLONOSCOPY  03-07-11   Dr Jamal Collin   COLONOSCOPY  03/07/2011   COLONOSCOPY WITH PROPOFOL N/A 07/16/2016   Procedure: COLONOSCOPY WITH PROPOFOL;  Surgeon: Christene Lye, MD;  Location: [facility] ENDOSCOPY;  Service: Endoscopy;  Laterality: N/A;   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   CORONARY ARTERY BYPASS GRAFT     EYE SURGERY  2009   HERNIA REPAIR  2010   ventral and umbilical    STOMACH SURGERY     sigmoid colon resection    Prior to Admission medications   Medication Sig Start Date End Date Taking? Authorizing Provider  amLODipine (NORVASC) 5 MG tablet Take 1 tablet by mouth daily. 03/28/19   [provider]  aspirin 81 MG tablet Take 81 mg by mouth daily.    [provider]  metoprolol succinate (TOPROL-XL) 25 MG 24 hr tablet Take 25 mg by mouth 2 (two) times daily.  02/05/13   [provider]  Multiple Vitamin (MULTIVITAMIN) tablet Take 1 tablet by mouth daily.    [provider]  Multiple Vitamins-Minerals (PRESERVISION AREDS 2) CAPS Take 1 capsule by mouth daily. Patient not taking: Reported on 12/01/2019    [provider]  omeprazole (PRILOSEC) 20 MG capsule Take 20 mg by mouth daily.  02/02/13   [provider]  simvastatin (ZOCOR) 20 MG tablet Take 20 mg by mouth daily.  02/16/13    [provider]  TOUJEO SOLOSTAR 300 UNIT/ML SOPN Inject 18 Units into the skin daily. 12/10/18   Nicholes Mango, MD  vitamin B-12 (CYANOCOBALAMIN) 1000 MCG tablet Take 1 tablet (1,000 mcg total) by mouth daily. Patient not taking: Reported on 12/01/2019 12/10/18   Nicholes Mango, MD    Allergies Ciprofloxacin hcl  Family History  Problem Relation Age of Onset   Cancer Mother    Heart attack Father     Social History Social History   Tobacco Use   Smoking status: Former    Years: 40.00    Types: Cigarettes   Smokeless tobacco: Never   Tobacco comments:    quit in 1990  Substance Use Topics   Alcohol use: No   Drug use: No    Review of Systems Level 5 caveat:  history/ROS limited by acute/critical illness  ____________________________________________   PHYSICAL EXAM:  VITAL SIGNS: ED Triage Vitals  Enc Vitals Group     BP 11/23/20 2239 (!) 116/55     Pulse Rate 11/23/20 2239 84     Resp 11/23/20 2239 20     Temp 11/23/20 2239 98.5 F (36.9 C)     Temp Source 11/23/20 2239 Oral     SpO2 11/23/20 2239 99 %     Weight 11/23/20 2239 90 kg (198 lb 6.6 oz)     Height 11/23/20 2239 1.727 m (5\' 8" )     Head Circumference --      Peak Flow --      Pain Score 11/24/20 0042 0     Pain Loc --      Pain Edu? --      Excl. in [place]? --     Constitutional: Alert and oriented only to self. Eyes: Conjunctivae are normal.  Head: Atraumatic. Nose: No congestion/rhinnorhea. Mouth/Throat: Patient is wearing a mask. Neck: No stridor.  No meningeal signs.   Cardiovascular: Mild tachycardia, regular rhythm. Good peripheral circulation. Respiratory: Normal respiratory effort.  No retractions. Gastrointestinal: Soft and nontender. No distention.  Musculoskeletal: No lower extremity tenderness nor edema. No gross deformities of extremities. Neurologic:  Normal speech and language. No gross focal neurologic deficits are appreciated.  Skin:  Skin is warm, dry and  intact.   ____________________________________________   LABS (all labs ordered are listed, but only abnormal results are displayed)  Labs Reviewed  COMPREHENSIVE METABOLIC PANEL - Abnormal; Notable for the following components:      Result Value   Potassium 6.4 (*)    Glucose, Bld 161 (*)    BUN 76 (*)    Creatinine, Ser 4.11 (*)    Total Protein 6.4 (*)    Albumin 3.3 (*)    GFR, Estimated 14 (*)    All other components within normal limits  CBC WITH DIFFERENTIAL/PLATELET - Abnormal; Notable for the following components:   WBC 21.8 (*)    Platelets 127 (*)  Neutro Abs 17.2 (*)    Monocytes Absolute 2.6 (*)    Abs Immature Granulocytes 0.22 (*)    All other components within normal limits  URINALYSIS, COMPLETE (UACMP) WITH MICROSCOPIC - Abnormal; Notable for the following components:   Color, Urine YELLOW (*)    APPearance HAZY (*)    Hgb urine dipstick LARGE (*)    Protein, ur 30 (*)    Leukocytes,Ua LARGE (*)    RBC / HPF >50 (*)    WBC, UA >50 (*)    Bacteria, UA FEW (*)    All other components within normal limits  PROTIME-INR - Abnormal; Notable for the following components:   Prothrombin Time 15.9 (*)    INR 1.3 (*)    All other components within normal limits  CBC - Abnormal; Notable for the following components:   WBC 18.8 (*)    RBC 3.96 (*)    Hemoglobin 11.4 (*)    HCT 35.1 (*)    Platelets 94 (*)    All other components within normal limits  CBG MONITORING, ED - Abnormal; Notable for the following components:   Glucose-Capillary 136 (*)    All other components within normal limits  RESP PANEL BY RT-PCR (FLU A&B, COVID) ARPGX2  CULTURE, BLOOD (ROUTINE X 2)  CULTURE, BLOOD (ROUTINE X 2)  URINE CULTURE  LACTIC ACID, PLASMA  LACTIC ACID, PLASMA  APTT  PROTIME-INR  CORTISOL-AM, BLOOD  PROCALCITONIN  CBC  POTASSIUM  HEMOGLOBIN A1C   ____________________________________________  EKG  ED ECG REPORT I, Hinda Kehr, the attending physician,  personally viewed and interpreted this ECG.  Date: 11/24/2020 EKG Time: 1:43 AM Rate: 93 Rhythm: normal sinus rhythm QRS Axis: normal Intervals: normal ST/T Wave abnormalities: Non-specific ST segment / T-wave changes, but no clear evidence of acute ischemia. Narrative Interpretation: no definitive evidence of acute ischemia; does not meet STEMI criteria.  ____________________________________________  RADIOLOGY I, Hinda Kehr, personally viewed and evaluated these images (plain radiographs) as part of my medical decision making, as well as reviewing the written report by the radiologist.  ED MD interpretation: No acute abnormality on chest x-ray nor CT renal stone protocol of the abdomen and pelvis.  Official radiology report(s): DG Chest Port 1 View  Result Date: 11/23/2020 CLINICAL DATA:  80 year old male with hematuria. EXAM: PORTABLE CHEST 1 VIEW COMPARISON:  Chest radiograph dated 10/26/2020. FINDINGS: No focal consolidation pleural effusion, pneumothorax. The cardiac silhouette is within limits. Atherosclerotic calcification the aorta. Median sternotomy wires. No acute osseous pathology. IMPRESSION: No active cardiopulmonary disease. Electronically Signed   By: Anner Crete M.D.   On: 11/23/2020 23:37   CT Renal Stone Study  Result Date: 11/24/2020 CLINICAL DATA:  Sepsis and renal failure EXAM: CT ABDOMEN AND PELVIS WITHOUT CONTRAST TECHNIQUE: Multidetector CT imaging of the abdomen and pelvis was performed following the standard protocol without IV contrast. COMPARISON:  01/01/2015 FINDINGS: LOWER CHEST: Normal. HEPATOBILIARY: Normal hepatic contours. No intra- or extrahepatic biliary dilatation. There is cholelithiasis without acute inflammation. PANCREAS: Normal pancreas. No ductal dilatation or peripancreatic fluid collection. SPLEEN: Normal. ADRENALS/URINARY TRACT: There is a right adrenal lesion measuring 14 mm with an attenuation of -1 HU. The other adrenal gland is normal.  No hydronephrosis, nephroureterolithiasis or solid renal mass. The urinary bladder is normal for degree of distention STOMACH/BOWEL: There is no hiatal hernia. Normal duodenal course and caliber. No small bowel dilatation or inflammation. No focal colonic abnormality. Normal appendix. VASCULAR/LYMPHATIC: Infrarenal abdominal aortic aneurysm measures 4.2 cm. REPRODUCTIVE: Prostate  is enlarged, measuring 5.7 cm MUSCULOSKELETAL. No bony spinal canal stenosis or focal osseous abnormality. OTHER: None. IMPRESSION: 1. No acute abnormality of the abdomen or pelvis. 2. Cholelithiasis without acute inflammation. 3. Right adrenal adenoma, unchanged. 4. 4.2 cm infrarenal abdominal aortic aneurysm. 5. Enlarged prostate. Electronically Signed   By: Ulyses Jarred M.D.   On: 11/24/2020 02:06    ____________________________________________   PROCEDURES   Procedure(s) performed (including Critical Care):  .1-3 Lead EKG Interpretation  Date/Time: 11/24/2020 4:45 AM Performed by: Hinda Kehr, MD Authorized by: Hinda Kehr, MD     Interpretation: abnormal     ECG rate:  100   ECG rate assessment: tachycardic     Rhythm: sinus tachycardia     Ectopy: none     Conduction: normal   .Critical Care  Date/Time: 11/24/2020 4:45 AM Performed by: Hinda Kehr, MD Authorized by: Hinda Kehr, MD   Critical care provider statement:    Critical care time (minutes):  45   Critical care time was exclusive of:  Separately billable procedures and treating other patients   Critical care was necessary to treat or prevent imminent or life-threatening deterioration of the following conditions:  Sepsis   Critical care was time spent personally by me on the following activities:  Development of treatment plan with patient or surrogate, discussions with consultants, evaluation of patient's response to treatment, examination of patient, obtaining history from patient or surrogate, ordering and performing treatments and  interventions, ordering and review of laboratory studies, ordering and review of radiographic studies, pulse oximetry, re-evaluation of patient's condition and review of old charts   ____________________________________________   [place] / MDM / [place] / ED COURSE  As part of my medical decision making, I reviewed the following data within the electronic MEDICAL RECORD NUMBER History obtained from family, Nursing notes reviewed and incorporated, Labs reviewed , EKG interpreted , Old chart reviewed, Radiograph reviewed , Discussed with admitting physician , and Notes from prior ED visits   Differential diagnosis includes, but is not limited to, renal failure, sepsis, electrolyte or metabolic abnormality, CHYIF-02, pneumonia, urinary tract infection, obstructive uropathy.  Patient appears to have a degree of delirium on top of chronic dementia.  Rectal temperature is 100.9 and he is mildly tachycardic and mildly tachypneic although that is partially due to being in a new environment and being a little bit upset although he is redirectable by staff and family.  Strongly suspect UTI.  The patient is able to urinate on his own and we will obtain a urine specimen.  Blood work obtained in triage notes a white count of 21.8 and I made him sepsis protocol including cefepime 2 g IV for empiric treatment of presumed urinary tract infection.  His comprehensive metabolic panel is notable for acute on chronic renal failure with a creatinine of 4.11 and a BUN of 76.  His potassium is 6.4.  I am starting with IV fluids (2 L lactated Ringer's).  Anticipate additional treatment of the hyperkalemia but volume resuscitation will be the first step.  His EKG is generally reassuring with no significant interval abnormalities and no evidence of ischemia.  He is in no pain at this time.  His daughter is at bedside and I warned her that he is very ill and will need admission and may get worse before he gets  better.    The patient is on the cardiac monitor to evaluate for evidence of arrhythmia and/or significant heart rate changes.  Of note, I was not able to document in real-time due to multiple critically ill patients, but the patient became very agitated as he continues to sundown and show signs of delirium and became aggressive with his daughter and with staff.  I administered droperidol 2.5 mg IV which helped calm him.     Clinical Course as of 11/24/20 0446  Sat Nov 24, 2020  0220 No acute abnormalities on CT.  Urinalysis is grossly positive.  Patient already receiving treatment with cefepime 2 g IV.  We will recheck a basic metabolic panel now that he has gotten some fluids.  I also ordered regular insulin 5 units IV as well as glucose to try and push him with a potassium intracellular.  I also ordered Lokelma 10 g packet. [CF]  0220 Consulting hospitalist for admission [CF]  0228 Ordered repeat basic metabolic panel to see if the potassium has come down [CF]  0239 Discussed case by phone with Dr. Damita Dunnings who will admit. [CF]    Clinical Course User Index [CF] Hinda Kehr, MD     ____________________________________________  FINAL CLINICAL IMPRESSION(S) / ED DIAGNOSES  Final diagnoses:  Sepsis with acute renal failure without septic shock, due to unspecified organism, unspecified acute renal failure type ([place])  Delirium  Urinary tract infection with hematuria, site unspecified     MEDICATIONS GIVEN DURING THIS VISIT:  Medications  enoxaparin (LOVENOX) injection 30 mg (30 mg Subcutaneous Given 11/24/20 0325)  lactated ringers infusion ( Intravenous New Bag/Given 11/24/20 0311)  cefTRIAXone (ROCEPHIN) 1 g in sodium chloride 0.9 % 100 mL IVPB (has no administration in time range)  acetaminophen (TYLENOL) tablet 650 mg (has no administration in time range)    Or  acetaminophen (TYLENOL) suppository 650 mg (has no administration in time range)  ondansetron (ZOFRAN) tablet 4 mg  (has no administration in time range)    Or  ondansetron (ZOFRAN) injection 4 mg (has no administration in time range)  albuterol (PROVENTIL) (2.5 MG/3ML) 0.083% nebulizer solution 10 mg (has no administration in time range)  insulin aspart (novoLOG) injection 0-15 Units (has no administration in time range)  lactated ringers bolus 1,000 mL (0 mLs Intravenous Stopped 11/24/20 0210)  ceFEPIme (MAXIPIME) 2 g in sodium chloride 0.9 % 100 mL IVPB (0 g Intravenous Stopped 11/24/20 0209)  lactated ringers bolus 1,000 mL (0 mLs Intravenous Stopped 11/24/20 0218)  droperidol (INAPSINE) 2.5 MG/ML injection 2.5 mg (2.5 mg Intravenous Given 11/24/20 0212)  sodium zirconium cyclosilicate (LOKELMA) packet 10 g (10 g Oral Given 11/24/20 0231)  insulin aspart (novoLOG) injection 5 Units (5 Units Intravenous Given 11/24/20 0238)    And  dextrose 50 % solution 50 mL (50 mLs Intravenous Given 11/24/20 0235)     ED Discharge Orders     None        Note:  This document was prepared using Dragon voice recognition software and may include unintentional dictation errors.   Hinda Kehr, MD 11/24/20 [phone]

## 2020-11-24 NOTE — ED Notes (Signed)
Pt. Brief changed, new primo external catheter applied, bed linens changed, pt. Repositioned, neb treatment continued. Pt. Denies need at this time.

## 2020-11-24 NOTE — Sepsis Progress Note (Signed)
Following for sepsis monitoring ?

## 2020-11-24 NOTE — ED Notes (Signed)
Per pt's daughter, he has been having frequent, malodorous urination, and increased confusion x several days. Daughter states he told her he felt drunk yesterday, dizzy and light headed. PT. Has recently been worked up for dementia, and pt's daughter and wife are taking full time care of him. Daughter states his current confusion is worse than recent baseline. Pt. Febrile upon assessment. ED MD notified.

## 2020-11-25 ENCOUNTER — Encounter: Payer: Self-pay | Admitting: Internal Medicine

## 2020-11-25 DIAGNOSIS — N390 Urinary tract infection, site not specified: Secondary | ICD-10-CM | POA: Diagnosis not present

## 2020-11-25 DIAGNOSIS — R410 Disorientation, unspecified: Secondary | ICD-10-CM | POA: Diagnosis not present

## 2020-11-25 DIAGNOSIS — A419 Sepsis, unspecified organism: Secondary | ICD-10-CM | POA: Diagnosis not present

## 2020-11-25 DIAGNOSIS — N179 Acute kidney failure, unspecified: Secondary | ICD-10-CM | POA: Diagnosis not present

## 2020-11-25 DIAGNOSIS — I10 Essential (primary) hypertension: Secondary | ICD-10-CM

## 2020-11-25 LAB — BASIC METABOLIC PANEL
Anion gap: 5 (ref 5–15)
BUN: 46 mg/dL — ABNORMAL HIGH (ref 8–23)
CO2: 26 mmol/L (ref 22–32)
Calcium: 9.1 mg/dL (ref 8.9–10.3)
Chloride: 106 mmol/L (ref 98–111)
Creatinine, Ser: 2.47 mg/dL — ABNORMAL HIGH (ref 0.61–1.24)
GFR, Estimated: 26 mL/min — ABNORMAL LOW (ref >60)
Glucose, Bld: 94 mg/dL (ref 70–99)
Potassium: 5.4 mmol/L — ABNORMAL HIGH (ref 3.5–5.1)
Sodium: 137 mmol/L (ref 135–145)

## 2020-11-25 LAB — GLUCOSE, CAPILLARY
Glucose-Capillary: 103 mg/dL — ABNORMAL HIGH (ref 70–99)
Glucose-Capillary: 88 mg/dL (ref 70–99)
Glucose-Capillary: 98 mg/dL (ref 70–99)
Glucose-Capillary: 169 mg/dL — ABNORMAL HIGH (ref 70–99)
Glucose-Capillary: 140 mg/dL — ABNORMAL HIGH (ref 70–99)
Glucose-Capillary: 119 mg/dL — ABNORMAL HIGH (ref 70–99)

## 2020-11-25 LAB — VALPROIC ACID LEVEL: Valproic Acid Lvl: <10 ug/mL — ABNORMAL LOW (ref 50.0–100.0)

## 2020-11-25 MED ORDER — METOPROLOL SUCCINATE ER 25 MG PO TB24
25.0000 mg | ORAL_TABLET | Freq: Every day | ORAL | Status: DC
  Administered 2020-11-25: 25 mg via ORAL
  Filled 2020-11-25: qty 1

## 2020-11-25 MED ORDER — TAMSULOSIN HCL 0.4 MG PO CAPS
0.4000 mg | ORAL_CAPSULE | Freq: Every day | ORAL | Status: DC
  Administered 2020-11-25 – 2020-11-27 (×3): 0.4 mg via ORAL
  Filled 2020-11-25 (×3): qty 1

## 2020-11-25 NOTE — Evaluation (Signed)
Physical Therapy Evaluation Patient Details Name: Robert Conrad MRN: [id number] DOB: 1940/10/15 Today's Date: 11/25/2020   History of Present Illness  Robert Conrad is a 80 y.o. male with medical history significant for DM, HTN, PAD, AAA, renal artery stenosis with CKD 4, suspected dementia, and history of CABG at age 32 who was brought to the emergency room with a several day history of altered mental status and increasing confusion, fever and foul-smelling urine. CT abdomen and pelvis nonacute.  4.2 cm infrarenal AAA. Patient admitted 11/24/2020 for severe sepsis secondary to UTI, hyperkalemia, acute metabolic encephalopathy, dementia, AKI on CKDIV, R renal artery stenosis.   Clinical Impression  Patient sleeping in bed upon arrival. Is unable to provide history due to cognitive deficits, so history was obtained from son, Robert Conrad, who sees him periodically. Patient lives with his wife in a single story home with 6 steps to enter. Prior to about 1 month ago, patient was ambulating independently in the home and community and was I with ADLs and IADLs except driving (which he could not due to deficits in eyesight). Robert Conrad has noticed a rapid decline in cognition in the last month. Upon PT evaluation, patient is affected by lethargy likely second to receiving haldol this afternoon to manage agitation. Patient became alert enough to participate with mod A to sit on edge of bed and then performed transfers and ambulation ~ 105 feet with min A and RW. Required step by step cuing for safe/appropriate body/AD placement, to stay awake, and wayfinding during functional mobility and was dependent on PT for cognitive tasks. Patient currently demonstrates significant decline in functional mobility/independence and requires physical assist for mobility and 24/7 supervision for safety. He would benefit from short term rehab prior to returning home but if cognition improves may advance to being appropriate for  discharge home with HHPT during his hospital stay. Patient would benefit from skilled physical therapy to address impairments and functional limitations (see PT Problem List below) to work towards stated goals and return to PLOF or maximal functional independence.       Follow Up Recommendations SNF    Equipment Recommendations  Rolling walker with 5" wheels;3in1 (PT) (son unsure if he has these. Would benefit from this DME if he does not already have.)    Recommendations for Other Services       Precautions / Restrictions Precautions Precautions: Fall Restrictions Weight Bearing Restrictions: No      Mobility  Bed Mobility Overal bed mobility: Needs Assistance Bed Mobility: Supine to Sit     Supine to sit: Mod assist     General bed mobility comments: needed support at trunk and LE due to lethargy. Improved alertness once seated.    Transfers Overall transfer level: Needs assistance Equipment used: Rolling walker (2 wheeled) Transfers: Sit to/from Stand Sit to Stand: Min assist         General transfer comment: Patient transfered sit <> stand from bed to chair and chair <> chair with min A using RW. Patient required step by step instructions on hand placement and physical assist to shift weight far enough forward to gain balance. Required tactile cuing for safe hand placement.  Ambulation/Gait Ambulation/Gait assistance: Min assist Gait Distance (Feet): 105 Feet Assistive device: Rolling walker (2 wheeled) Gait Pattern/deviations: Drifts right/left;Trunk flexed;Decreased stride length Gait velocity: reduced   General Gait Details: Patient ambulated ~ 105 feet with RW and min A in room and hallway with cuing for safe body/AD positioning, posture, and direction  of travel.  Stairs            Wheelchair Mobility    Modified Rankin (Stroke Patients Only)       Balance Overall balance assessment: History of Falls;Needs assistance Sitting-balance support:  Bilateral upper extremity supported;Feet supported Sitting balance-Leahy Scale: Fair Sitting balance - Comments: steady sitting at edge of bed unless patient starts to fall asleep and needs to be aroused to prevent falling backwards   Standing balance support: Bilateral upper extremity supported;During functional activity Standing balance-Leahy Scale: Fair Standing balance comment: Patient dependent on RW for balance during ambulation and static stance. Needs reminders to stay awake to prevent falls.                             Pertinent Vitals/Pain Pain Assessment: Faces Faces Pain Scale: No hurt    Home Living Family/patient expects to be discharged to:: Private residence Living Arrangements: Spouse/significant other Available Help at Discharge: Available 24 hours/day Type of Home: House Home Access: Stairs to enter Entrance Stairs-Rails: Right;Left;Can reach both Entrance Stairs-Number of Steps: 6 steps through the front Home Layout: One level Home Equipment:  (son unsure what equipment pt has. Does not know if any equipment he usually has)      Prior Function Level of Independence: Needs assistance   Gait / Transfers Assistance Needed: ambulated I with no AD in the home and the community.  ADL's / Homemaking Assistance Needed: was I with ADLs, IADLs except driving (children assisted with this).  Comments: Patient unable to provide history due to decreased cognition. History taken from son (Robert Conrad)  who was at bedside during evaluation. Son knows of one fall in the last 6 months while getting out of shower. Robert Conrad states pt has had more trouble over the last month.     Hand Dominance        Extremity/Trunk Assessment   Upper Extremity Assessment Upper Extremity Assessment: Generalized weakness    Lower Extremity Assessment Lower Extremity Assessment: Generalized weakness    Cervical / Trunk Assessment Cervical / Trunk Assessment: Normal   Communication   Communication: HOH  Cognition Arousal/Alertness: Lethargic   Overall Cognitive Status: Impaired/Different from baseline                                 General Comments: Patient's son Robert Conrad states patient had a sharp decline in function over the last month. Prior to that he was having clear conversations when son visited after work each day.      General Comments General comments (skin integrity, edema, etc.): RN reports patient had haldol this afternoon. He is very drowsy and starts to fall asleep with any break in activity. HR rose to 129 bpm during ambulation. SpO2 remained WFL throughout session.    Exercises Other Exercises Other Exercises: Patient practiced standing balance while urinating in hand held urinal managed by PT. Other Exercises: educated son Robert Conrad on role of PT in acute care setting. Other Exercises: Practiced functional mobiltiy including bed mobility, transfers, and gait. Attempted to get pt interested in eating but too sleepy to do more than swallow a bite of magic ice cream held up to his lips by PT   Assessment/Plan    PT Assessment Patient needs continued PT services  PT Problem List Decreased strength;Decreased coordination;Decreased cognition;Decreased activity tolerance;Decreased knowledge of use of DME;Decreased balance;Decreased safety awareness;Decreased mobility  PT Treatment Interventions DME instruction;Balance training;Gait training;Neuromuscular re-education;Cognitive remediation;Stair training;Functional mobility training;Patient/family education;Therapeutic activities;Therapeutic exercise    PT Goals (Current goals can be found in the Care Plan section)  Acute Rehab PT Goals Patient Stated Goal: get better PT Goal Formulation: With family Time For Goal Achievement: 12/09/20 Potential to Achieve Goals: Good    Frequency Min 2X/week   Barriers to discharge Decreased caregiver support;Inaccessible home  environment patient currently requires physical assist for basic mobility and lacks basic self-awareness and safety awareness for home environment.    Co-evaluation               AM-PAC PT "6 Clicks" Mobility  Outcome Measure Help needed turning from your back to your side while in a flat bed without using bedrails?: A Lot Help needed moving from lying on your back to sitting on the side of a flat bed without using bedrails?: A Lot Help needed moving to and from a bed to a chair (including a wheelchair)?: A Little Help needed standing up from a chair using your arms (e.g., wheelchair or bedside chair)?: A Little Help needed to walk in hospital room?: A Little Help needed climbing 3-5 steps with a railing? : A Lot 6 Click Score: 15    End of Session Equipment Utilized During Treatment: Gait belt Activity Tolerance: Patient limited by lethargy;Patient tolerated treatment well Patient left: in chair;with call bell/phone within reach;with chair alarm set;with family/visitor present Nurse Communication: Mobility status PT Visit Diagnosis: Unsteadiness on feet (R26.81);Difficulty in walking, not elsewhere classified (R26.2);History of falling (Z91.81);Muscle weakness (generalized) (M62.81)    Time: 6286-3817 PT Time Calculation (min) (ACUTE ONLY): 45 min   Charges:   PT Evaluation $PT Eval Moderate Complexity: 1 Mod PT Treatments $Gait Training: 8-22 mins $Therapeutic Activity: 8-22 mins        Everlean Alstrom. Graylon Good, PT, DPT 11/25/20, 6:37 PM

## 2020-11-25 NOTE — Progress Notes (Signed)
Potassium 5.4 this AM. MD Damita Dunnings notified. No new orders at this time.  Earleen Reaper, RN

## 2020-11-25 NOTE — Progress Notes (Signed)
Chaplain provided AD materials and education. Son was bedside.  Pt is extremely hard of hearing and did not appear to understand offering of material. Pt appeared frustrated by attempts by this chaplain to explain visit. Chaplain requested support of son to explain to pt the opportunity to complete an AD at a later time.   F/u attempts to complete AD should be contingent upon pt's complete understanding and desire to proceed.   Minus [place], [place] Pager: [phone]   11/25/20 2000  Clinical Encounter Type  Visited With Patient and family together  Visit Type Initial  Referral From Nurse  Consult/Referral To Chaplain  Stress Factors  Patient Stress Factors Lack of knowledge  Family Stress Factors Family relationships  Advance Directives (For Healthcare)  Would patient like information on creating a medical advance directive?  (Chaplain provided AD materials to pt/family bedside)

## 2020-11-25 NOTE — Progress Notes (Signed)
Patient ID: Robert Conrad, male   DOB: 06/08/1940, 80 y.o.   MRN: [id number] [facility] PROGRESS NOTE  Robert Conrad PNT:[id number] DOB: [birth year] DOA: 11/24/2020 PCP: Albina Billet, MD  HPI/Subjective: Patient awakened from sleep.  Answers a few yes or no questions.  Able to follow some commands.  Admitted with acute metabolic encephalopathy and sepsis  Objective: Vitals:   11/25/20 0757 11/25/20 1201  BP: (!) 141/51 (!) 145/57  Pulse: 65 78  Resp: 16   Temp: 98.3 F (36.8 C) 98.6 F (37 C)  SpO2: 97% 94%    Intake/Output Summary (Last 24 hours) at 11/25/2020 1653 Last data filed at 11/25/2020 1507 Gross per 24 hour  Intake 1598.52 ml  Output 1500 ml  Net 98.52 ml   Filed Weights   11/23/20 2239 11/25/20 0000  Weight: 90 kg 78.8 kg    ROS: Review of Systems  Respiratory:  Negative for shortness of breath.   Cardiovascular:  Negative for chest pain.  Gastrointestinal:  Negative for abdominal pain, nausea and vomiting.  Exam: Physical Exam HENT:     Head: Normocephalic.     Mouth/Throat:     Pharynx: No oropharyngeal exudate.  Eyes:     General: Lids are normal.     Conjunctiva/sclera: Conjunctivae normal.  Cardiovascular:     Rate and Rhythm: Normal rate and regular rhythm.     Heart sounds: Normal heart sounds, S1 normal and S2 normal.  Pulmonary:     Breath sounds: No decreased breath sounds, wheezing, rhonchi or rales.  Abdominal:     Palpations: Abdomen is soft.     Tenderness: There is no abdominal tenderness.  Musculoskeletal:     Right lower leg: No swelling.     Left lower leg: No swelling.  Skin:    General: Skin is warm.     Findings: No rash.  Neurological:     Mental Status: He is alert.     Comments: Answer some questions.  Follows some simple commands and able to straight leg raise.     Data Reviewed: Basic Metabolic Panel: Recent Labs  Lab 11/23/20 2246 11/24/20 0433 11/25/20 0447  NA 136  --  137  K 6.4* 5.6* 5.4*   CL 107  --  106  CO2 23  --  26  GLUCOSE 161*  --  94  BUN 76*  --  46*  CREATININE 4.11* 3.49* 2.47*  CALCIUM 9.0  --  9.1   Liver Function Tests: Recent Labs  Lab 11/23/20 2246  AST 17  ALT 26  ALKPHOS 38  BILITOT 0.8  PROT 6.4*  ALBUMIN 3.3*   Recent Labs  Lab 11/19/20 1615  AMMONIA 11   CBC: Recent Labs  Lab 11/23/20 2246 11/24/20 0327 11/24/20 0433  WBC 21.8* 18.8* 19.5*  NEUTROABS 17.2*  --   --   HGB 13.6 11.4* 12.3*  HCT 42.7 35.1* 38.6*  MCV 89.7 88.6 90.4  PLT 127* 94* 107*     CBG: Recent Labs  Lab 11/24/20 1855 11/25/20 0136 11/25/20 0410 11/25/20 0858 11/25/20 1239  GLUCAP 98 103* 88 98 169*    Recent Results (from the past 240 hour(s))  Blood Culture (routine x 2)     Status: None (Preliminary result)   Collection Time: 11/24/20 12:41 AM   Specimen: BLOOD  Result Value Ref Range Status   Specimen Description BLOOD RIGHT ANTECUBITAL  Final   Special Requests   Final    BOTTLES DRAWN AEROBIC  AND ANAEROBIC Blood Culture results may not be optimal due to an excessive volume of blood received in culture bottles   Culture   Final    NO GROWTH 1 DAY Performed at [facility], [address] [place], [place] [address]    Report Status PENDING  Incomplete  Urine Culture     Status: Abnormal (Preliminary result)   Collection Time: 11/24/20 12:41 AM   Specimen: Urine, Random  Result Value Ref Range Status   Specimen Description   Final    URINE, RANDOM Performed at [facility], [address] [place], [place] [address]    Special Requests   Final    NONE Performed at [facility], [address] [place], [place] [address]    Culture (A)  Final    10,000 COLONIES/mL ESCHERICHIA COLI SUSCEPTIBILITIES TO FOLLOW Performed at [facility], [address] [place], [place] [address]    Report Status PENDING  Incomplete  Resp Panel by RT-PCR (Flu A&B, Covid) Nasopharyngeal Swab     Status: None    Collection Time: 11/24/20 12:47 AM   Specimen: Nasopharyngeal Swab; Nasopharyngeal(NP) swabs in vial transport medium  Result Value Ref Range Status   SARS Coronavirus 2 by RT PCR NEGATIVE NEGATIVE Final    Comment: (NOTE) SARS-CoV-2 target nucleic acids are NOT DETECTED.  The SARS-CoV-2 RNA is generally detectable in upper respiratory specimens during the acute phase of infection. The lowest concentration of SARS-CoV-2 viral copies this assay can detect is 138 copies/mL. A negative result does not preclude SARS-Cov-2 infection and should not be used as the sole basis for treatment or other patient management decisions. A negative result may occur with  improper specimen collection/handling, submission of specimen other than nasopharyngeal swab, presence of viral mutation(s) within the areas targeted by this assay, and inadequate number of viral copies(<138 copies/mL). A negative result must be combined with clinical observations, patient history, and epidemiological information. The expected result is Negative.  Fact Sheet for Patients:  EntrepreneurPulse.com.au  Fact Sheet for Healthcare Providers:  IncredibleEmployment.be  This test is no t yet approved or cleared by the [place] FDA and  has been authorized for detection and/or diagnosis of SARS-CoV-2 by FDA under an Emergency Use Authorization (EUA). This EUA will remain  in effect (meaning this test can be used) for the duration of the COVID-19 declaration under Section 564(b)(1) of the Act, 21 U.S.C.section 360bbb-3(b)(1), unless the authorization is terminated  or revoked sooner.       Influenza A by PCR NEGATIVE NEGATIVE Final   Influenza B by PCR NEGATIVE NEGATIVE Final    Comment: (NOTE) The Xpert Xpress SARS-CoV-2/FLU/RSV plus assay is intended as an aid in the diagnosis of influenza from Nasopharyngeal swab specimens and should not be used as a sole basis for treatment.  Nasal washings and aspirates are unacceptable for Xpert Xpress SARS-CoV-2/FLU/RSV testing.  Fact Sheet for Patients: EntrepreneurPulse.com.au  Fact Sheet for Healthcare Providers: IncredibleEmployment.be  This test is not yet approved or cleared by the [place] FDA and has been authorized for detection and/or diagnosis of SARS-CoV-2 by FDA under an Emergency Use Authorization (EUA). This EUA will remain in effect (meaning this test can be used) for the duration of the COVID-19 declaration under Section 564(b)(1) of the Act, 21 U.S.C. section 360bbb-3(b)(1), unless the authorization is terminated or revoked.  Performed at [facility], [address] [place],  [address]   Blood Culture (routine x 2)  Status: None (Preliminary result)   Collection Time: 11/24/20  1:08 AM   Specimen: BLOOD  Result Value Ref Range Status   Specimen Description BLOOD BLOOD LEFT HAND  Final   Special Requests   Final    BOTTLES DRAWN AEROBIC AND ANAEROBIC Blood Culture results may not be optimal due to an inadequate volume of blood received in culture bottles   Culture   Final    NO GROWTH 1 DAY Performed at [facility], [address] [place], [place] [address]    Report Status PENDING  Incomplete     Studies: DG Chest Port 1 View  Result Date: 11/23/2020 CLINICAL DATA:  80 year old male with hematuria. EXAM: PORTABLE CHEST 1 VIEW COMPARISON:  Chest radiograph dated 10/26/2020. FINDINGS: No focal consolidation pleural effusion, pneumothorax. The cardiac silhouette is within limits. Atherosclerotic calcification the aorta. Median sternotomy wires. No acute osseous pathology. IMPRESSION: No active cardiopulmonary disease. Electronically Signed   By: Anner Crete M.D.   On: 11/23/2020 23:37   CT Renal Stone Study  Result Date: 11/24/2020 CLINICAL DATA:  Sepsis and renal failure EXAM: CT ABDOMEN AND PELVIS WITHOUT CONTRAST  TECHNIQUE: Multidetector CT imaging of the abdomen and pelvis was performed following the standard protocol without IV contrast. COMPARISON:  01/01/2015 FINDINGS: LOWER CHEST: Normal. HEPATOBILIARY: Normal hepatic contours. No intra- or extrahepatic biliary dilatation. There is cholelithiasis without acute inflammation. PANCREAS: Normal pancreas. No ductal dilatation or peripancreatic fluid collection. SPLEEN: Normal. ADRENALS/URINARY TRACT: There is a right adrenal lesion measuring 14 mm with an attenuation of -1 HU. The other adrenal gland is normal. No hydronephrosis, nephroureterolithiasis or solid renal mass. The urinary bladder is normal for degree of distention STOMACH/BOWEL: There is no hiatal hernia. Normal duodenal course and caliber. No small bowel dilatation or inflammation. No focal colonic abnormality. Normal appendix. VASCULAR/LYMPHATIC: Infrarenal abdominal aortic aneurysm measures 4.2 cm. REPRODUCTIVE: Prostate is enlarged, measuring 5.7 cm MUSCULOSKELETAL. No bony spinal canal stenosis or focal osseous abnormality. OTHER: None. IMPRESSION: 1. No acute abnormality of the abdomen or pelvis. 2. Cholelithiasis without acute inflammation. 3. Right adrenal adenoma, unchanged. 4. 4.2 cm infrarenal abdominal aortic aneurysm. 5. Enlarged prostate. Electronically Signed   By: Ulyses Jarred M.D.   On: 11/24/2020 02:06    Scheduled Meds:  enoxaparin (LOVENOX) injection  30 mg Subcutaneous Q24H   QUEtiapine  12.5 mg Oral QHS   Continuous Infusions:  cefTRIAXone (ROCEPHIN)  IV 1 g (11/25/20 1237)   lactated ringers 75 mL/hr at 11/25/20 1237    Assessment/Plan:  Severe sepsis, hypotension, acute metabolic encephalopathy, acute kidney injury with leukocytosis fever tachycardia and tachypnea on presentation all present on admission.  Acute cystitis with hematuria.  So far urine culture only grew 10,000 colonies of E. coli and blood cultures are negative.  Repeat chest x-ray tomorrow morning to  empirically on Rocephin and IV fluids. Acute delirium with underlying dementia.  As needed Haldol and Seroquel at night.  Since mental status better today, started on dysphagia diet.  Speech therapy to follow-up tomorrow. Acute kidney injury on chronic kidney disease stage IV.  Creatinine 4.11 on presentation and down to 2.47 today. Hyperkalemia.  Potassium 6.4 on presentation and down to 5.4.  Should continue to improve with IV fluids. Essential hypertension.  Can restart Toprol XL since heart rate little fast and blood pressure has come up. BPH.  Restart Flomax. Weakness.  Physical therapy evaluation     Code Status:     Code Status Orders  (From admission, onward)  Start     Ordered   11/24/20 0757  Do not attempt resuscitation (DNR)  Continuous       Question Answer Comment  In the event of cardiac or respiratory ARREST Do not call a "code blue"   In the event of cardiac or respiratory ARREST Do not perform Intubation, CPR, defibrillation or ACLS   In the event of cardiac or respiratory ARREST Use medication by any route, position, wound care, and other measures to relive pain and suffering. May use oxygen, suction and manual treatment of airway obstruction as needed for comfort.   Comments nurse may pronounce      11/24/20 0756           Code Status History     Date Active Date Inactive Code Status Order ID Comments User Context   11/24/2020 0256 11/24/2020 0756 Full Code [id number]  Athena Masse, MD ED   12/07/2018 1332 12/10/2018 1841 Full Code [id number]  Lang Snow, NP ED      Family Communication: Spoke with son at the bedside Disposition Plan: Status is: Inpatient  Dispo: The patient is from: Home              Anticipated d/c is to: Home versus rehab depending on physical therapy              Patient currently able to answer some questions today so we will restart diet.  Continue antibiotics.  Repeat chest x-ray.   Difficult to place  patient.  No  Antibiotics: Rocephin  Time spent: 27 minutes  [place]

## 2020-11-26 ENCOUNTER — Inpatient Hospital Stay: Payer: Medicare HMO

## 2020-11-26 DIAGNOSIS — J189 Pneumonia, unspecified organism: Secondary | ICD-10-CM

## 2020-11-26 DIAGNOSIS — N400 Enlarged prostate without lower urinary tract symptoms: Secondary | ICD-10-CM

## 2020-11-26 DIAGNOSIS — A419 Sepsis, unspecified organism: Secondary | ICD-10-CM | POA: Diagnosis not present

## 2020-11-26 DIAGNOSIS — N179 Acute kidney failure, unspecified: Secondary | ICD-10-CM | POA: Diagnosis not present

## 2020-11-26 DIAGNOSIS — R410 Disorientation, unspecified: Secondary | ICD-10-CM | POA: Diagnosis not present

## 2020-11-26 LAB — BASIC METABOLIC PANEL
Anion gap: 7 (ref 5–15)
BUN: 32 mg/dL — ABNORMAL HIGH (ref 8–23)
CO2: 25 mmol/L (ref 22–32)
Calcium: 8.9 mg/dL (ref 8.9–10.3)
Chloride: 103 mmol/L (ref 98–111)
Creatinine, Ser: 1.85 mg/dL — ABNORMAL HIGH (ref 0.61–1.24)
GFR, Estimated: 36 mL/min — ABNORMAL LOW (ref >60)
Glucose, Bld: 127 mg/dL — ABNORMAL HIGH (ref 70–99)
Potassium: 5.4 mmol/L — ABNORMAL HIGH (ref 3.5–5.1)
Sodium: 135 mmol/L (ref 135–145)

## 2020-11-26 LAB — URINE CULTURE: Culture: 10000 — AB

## 2020-11-26 LAB — HEMOGLOBIN A1C
Hgb A1c MFr Bld: 6.7 % — ABNORMAL HIGH (ref 4.8–5.6)
Mean Plasma Glucose: 146 mg/dL

## 2020-11-26 MED ORDER — SODIUM CHLORIDE 0.9 % IV SOLN
2.0000 g | INTRAVENOUS | Status: DC
  Administered 2020-11-26 – 2020-11-28 (×3): 2 g via INTRAVENOUS
  Filled 2020-11-26: qty 2
  Filled 2020-11-26: qty 20
  Filled 2020-11-26: qty 2

## 2020-11-26 MED ORDER — AZITHROMYCIN 250 MG PO TABS
500.0000 mg | ORAL_TABLET | Freq: Every day | ORAL | Status: AC
  Administered 2020-11-26: 500 mg via ORAL
  Filled 2020-11-26: qty 2

## 2020-11-26 MED ORDER — METOPROLOL SUCCINATE ER 50 MG PO TB24
50.0000 mg | ORAL_TABLET | Freq: Every day | ORAL | Status: DC
  Administered 2020-11-26 – 2020-11-27 (×2): 50 mg via ORAL
  Filled 2020-11-26 (×2): qty 1

## 2020-11-26 MED ORDER — AZITHROMYCIN 250 MG PO TABS
250.0000 mg | ORAL_TABLET | Freq: Every day | ORAL | Status: DC
Start: 2020-11-27 — End: 2020-11-28
  Administered 2020-11-27 – 2020-11-28 (×2): 250 mg via ORAL
  Filled 2020-11-26 (×2): qty 1

## 2020-11-26 MED ORDER — SODIUM ZIRCONIUM CYCLOSILICATE 10 G PO PACK
10.0000 g | PACK | Freq: Every day | ORAL | Status: DC
  Administered 2020-11-26 – 2020-11-28 (×2): 10 g via ORAL
  Filled 2020-11-26 (×3): qty 1

## 2020-11-26 MED ORDER — ENOXAPARIN SODIUM 40 MG/0.4ML IJ SOSY
40.0000 mg | PREFILLED_SYRINGE | INTRAMUSCULAR | Status: DC
  Administered 2020-11-26 – 2020-11-27 (×2): 40 mg via SUBCUTANEOUS
  Filled 2020-11-26 (×2): qty 0.4

## 2020-11-26 NOTE — Progress Notes (Signed)
PHARMACIST - PHYSICIAN COMMUNICATION  CONCERNING:  Enoxaparin (Lovenox) for DVT Prophylaxis    RECOMMENDATION: Patient was prescribed enoxaparin 30mg  q24 hours for VTE prophylaxis.   Filed Weights   11/23/20 2239 11/25/20 0000  Weight: 90 kg (198 lb 6.6 oz) 78.8 kg (173 lb 11.6 oz)    Body mass index is 26.41 kg/m.  Estimated Creatinine Clearance: 30.3 mL/min (A) (by C-G formula based on SCr of 1.85 mg/dL (H)).   Patient is candidate for enoxaparin 40mg  every 24 hours based on CrCl >5ml/min and Weight >45kg  DESCRIPTION: Pharmacy has adjusted enoxaparin dose per [facility] policy.  Patient is now receiving enoxaparin 40 mg every 24 hours   Benita Gutter 11/26/2020 7:34 AM

## 2020-11-26 NOTE — Progress Notes (Signed)
Patient ID: Robert Conrad, male   DOB: January 09, 1941, 80 y.o.   MRN: [id number] [facility] PROGRESS NOTE  Robert Conrad ZJQ:[id number] DOB: Oct 19, 1940 DOA: 11/24/2020 PCP: Robert Billet, MD  HPI/Subjective: Patient poor historian.  Answers a few questions.  Able to follow some simple commands and straight leg raise.  No pain.  Admitted with sepsis.  Objective: Vitals:   11/26/20 0330 11/26/20 1100  BP: 131/82 128/78  Pulse: 100 97  Resp: 18 17  Temp: 98.5 F (36.9 C) 98.4 F (36.9 C)  SpO2: 96% 96%    Intake/Output Summary (Last 24 hours) at 11/26/2020 1604 Last data filed at 11/26/2020 1454 Gross per 24 hour  Intake 1820.73 ml  Output 1400 ml  Net 420.73 ml   Filed Weights   11/23/20 2239 11/25/20 0000  Weight: 90 kg 78.8 kg    ROS: Review of Systems  Respiratory:  Negative for shortness of breath.   Cardiovascular:  Negative for chest pain.  Gastrointestinal:  Negative for abdominal pain.  Exam: Physical Exam HENT:     Head: Normocephalic.     Mouth/Throat:     Pharynx: No oropharyngeal exudate.  Eyes:     General: Lids are normal.     Conjunctiva/sclera: Conjunctivae normal.  Cardiovascular:     Rate and Rhythm: Normal rate and regular rhythm.     Heart sounds: Normal heart sounds, S1 normal and S2 normal.  Pulmonary:     Breath sounds: Examination of the right-lower field reveals decreased breath sounds. Decreased breath sounds present. No wheezing, rhonchi or rales.  Abdominal:     Palpations: Abdomen is soft.     Tenderness: There is no abdominal tenderness.  Musculoskeletal:     Right lower leg: No swelling.     Left lower leg: No swelling.  Skin:    General: Skin is warm.     Findings: No rash.  Neurological:     Mental Status: He is alert.     Comments: Able to answer some simple questions and follow some commands.  Does not remember seeing me yesterday.     Data Reviewed: Basic Metabolic Panel: Recent Labs  Lab 11/23/20 2246  11/24/20 0433 11/25/20 0447 11/26/20 0510  NA 136  --  137 135  K 6.4* 5.6* 5.4* 5.4*  CL 107  --  106 103  CO2 23  --  26 25  GLUCOSE 161*  --  94 127*  BUN 76*  --  46* 32*  CREATININE 4.11* 3.49* 2.47* 1.85*  CALCIUM 9.0  --  9.1 8.9   Liver Function Tests: Recent Labs  Lab 11/23/20 2246  AST 17  ALT 26  ALKPHOS 38  BILITOT 0.8  PROT 6.4*  ALBUMIN 3.3*   Recent Labs  Lab 11/19/20 1615  AMMONIA 11   CBC: Recent Labs  Lab 11/23/20 2246 11/24/20 0327 11/24/20 0433  WBC 21.8* 18.8* 19.5*  NEUTROABS 17.2*  --   --   HGB 13.6 11.4* 12.3*  HCT 42.7 35.1* 38.6*  MCV 89.7 88.6 90.4  PLT 127* 94* 107*     CBG: Recent Labs  Lab 11/25/20 0410 11/25/20 0858 11/25/20 1239 11/25/20 1724 11/25/20 2252  GLUCAP 88 98 169* 140* 119*    Recent Results (from the past 240 hour(s))  Blood Culture (routine x 2)     Status: None (Preliminary result)   Collection Time: 11/24/20 12:41 AM   Specimen: BLOOD  Result Value Ref Range Status   Specimen Description  BLOOD RIGHT ANTECUBITAL  Final   Special Requests   Final    BOTTLES DRAWN AEROBIC AND ANAEROBIC Blood Culture results may not be optimal due to an excessive volume of blood received in culture bottles   Culture   Final    NO GROWTH 2 DAYS Performed at [facility], [address] [place], [place] [address]    Report Status PENDING  Incomplete  Urine Culture     Status: Abnormal   Collection Time: 11/24/20 12:41 AM   Specimen: Urine, Random  Result Value Ref Range Status   Specimen Description   Final    URINE, RANDOM Performed at [facility], [address], [place], [place] [address]    Special Requests   Final    NONE Performed at [facility], [place], [place] [address]    Culture 10,000 COLONIES/mL ESCHERICHIA COLI (A)  Final   Report Status 11/26/2020 FINAL  Final   Organism ID, Bacteria ESCHERICHIA COLI (A)  Final      Susceptibility   Escherichia  coli - MIC*    AMPICILLIN >=32 RESISTANT Resistant     CEFAZOLIN <=4 SENSITIVE Sensitive     CEFEPIME <=0.12 SENSITIVE Sensitive     CEFTRIAXONE <=0.25 SENSITIVE Sensitive     CIPROFLOXACIN <=0.25 SENSITIVE Sensitive     GENTAMICIN <=1 SENSITIVE Sensitive     IMIPENEM <=0.25 SENSITIVE Sensitive     NITROFURANTOIN <=16 SENSITIVE Sensitive     TRIMETH/SULFA <=20 SENSITIVE Sensitive     AMPICILLIN/SULBACTAM >=32 RESISTANT Resistant     PIP/TAZO <=4 SENSITIVE Sensitive     * 10,000 COLONIES/mL ESCHERICHIA COLI  Resp Panel by RT-PCR (Flu A&B, Covid) Nasopharyngeal Swab     Status: None   Collection Time: 11/24/20 12:47 AM   Specimen: Nasopharyngeal Swab; Nasopharyngeal(NP) swabs in vial transport medium  Result Value Ref Range Status   SARS Coronavirus 2 by RT PCR NEGATIVE NEGATIVE Final    Comment: (NOTE) SARS-CoV-2 target nucleic acids are NOT DETECTED.  The SARS-CoV-2 RNA is generally detectable in upper respiratory specimens during the acute phase of infection. The lowest concentration of SARS-CoV-2 viral copies this assay can detect is 138 copies/mL. A negative result does not preclude SARS-Cov-2 infection and should not be used as the sole basis for treatment or other patient management decisions. A negative result may occur with  improper specimen collection/handling, submission of specimen other than nasopharyngeal swab, presence of viral mutation(s) within the areas targeted by this assay, and inadequate number of viral copies(<138 copies/mL). A negative result must be combined with clinical observations, patient history, and epidemiological information. The expected result is Negative.  Fact Sheet for Patients:  EntrepreneurPulse.com.au  Fact Sheet for Healthcare Providers:  IncredibleEmployment.be  This test is no t yet approved or cleared by the [place] FDA and  has been authorized for detection and/or diagnosis of SARS-CoV-2  by FDA under an Emergency Use Authorization (EUA). This EUA will remain  in effect (meaning this test can be used) for the duration of the COVID-19 declaration under Section 564(b)(1) of the Act, 21 U.S.C.section 360bbb-3(b)(1), unless the authorization is terminated  or revoked sooner.       Influenza A by PCR NEGATIVE NEGATIVE Final   Influenza B by PCR NEGATIVE NEGATIVE Final    Comment: (NOTE) The Xpert Xpress SARS-CoV-2/FLU/RSV plus assay is intended as an aid in the diagnosis of influenza from Nasopharyngeal swab specimens and should not be used as a sole basis  for treatment. Nasal washings and aspirates are unacceptable for Xpert Xpress SARS-CoV-2/FLU/RSV testing.  Fact Sheet for Patients: EntrepreneurPulse.com.au  Fact Sheet for Healthcare Providers: IncredibleEmployment.be  This test is not yet approved or cleared by the [place] FDA and has been authorized for detection and/or diagnosis of SARS-CoV-2 by FDA under an Emergency Use Authorization (EUA). This EUA will remain in effect (meaning this test can be used) for the duration of the COVID-19 declaration under Section 564(b)(1) of the Act, 21 U.S.C. section 360bbb-3(b)(1), unless the authorization is terminated or revoked.  Performed at [facility], [address] [place], [place] [address]   Blood Culture (routine x 2)     Status: None (Preliminary result)   Collection Time: 11/24/20  1:08 AM   Specimen: BLOOD  Result Value Ref Range Status   Specimen Description BLOOD BLOOD LEFT HAND  Final   Special Requests   Final    BOTTLES DRAWN AEROBIC AND ANAEROBIC Blood Culture results may not be optimal due to an inadequate volume of blood received in culture bottles   Culture   Final    NO GROWTH 2 DAYS Performed at [facility], [address], [place], [place] [address]    Report Status PENDING  Incomplete     Studies: DG Chest Port 1  View  Result Date: 11/26/2020 CLINICAL DATA:  Concern for sepsis EXAM: PORTABLE CHEST 1 VIEW COMPARISON:  11/23/2020 FINDINGS: New small nodular opacity mid left lung. Minimal patchy density at the lung bases. No pleural effusion or pneumothorax. Stable cardiomediastinal contours. IMPRESSION: New small nodular opacity mid left lung. Mid bibasilar patchy density. May reflect infectious process in the appropriate setting. Electronically Signed   By: Macy Mis M.D.   On: 11/26/2020 08:15    Scheduled Meds:  [START ON 11/27/2020] azithromycin  250 mg Oral Daily   enoxaparin (LOVENOX) injection  40 mg Subcutaneous Q24H   metoprolol succinate  50 mg Oral QHS   QUEtiapine  12.5 mg Oral QHS   sodium zirconium cyclosilicate  10 g Oral Daily   tamsulosin  0.4 mg Oral QPC supper   Continuous Infusions:  cefTRIAXone (ROCEPHIN)  IV Stopped (11/26/20 1310)   lactated ringers 40 mL/hr at 11/26/20 0741    Assessment/Plan:  Severe sepsis, hypotension, acute metabolic encephalopathy acute kidney injury with leukocytosis fever tachycardia and tachypnea on presentation.  All present on admission.  Patient's urine culture only growing out 10,000 and E. coli and blood cultures are negative.  In speaking with the patient's daughter did have 1 dose of antibiotic prior to coming into the hospital.  Repeat chest x-ray possible pneumonia.  Patient already on Rocephin.  Add Zithromax today. Acute delirium with underlying dementia.  Seroquel at night. Acute kidney injury on chronic kidney disease stage IIIb.  Creatinine 4.11 on presentation down to 1.85. Hyperkalemia with potassium 5.4 today.  We will give a dose of Lokelma today and recheck tomorrow. Essential hypertension increase Toprol-XL to 50 mg nightly BPH on Flomax Weakness.  Physical therapy recommends rehab but I think family would like to take the patient home.     Code Status:     Code Status Orders  (From admission, onward)           Start      Ordered   11/24/20 0757  Do not attempt resuscitation (DNR)  Continuous       Question Answer Comment  In the event of cardiac or respiratory ARREST Do not call a "  code blue"   In the event of cardiac or respiratory ARREST Do not perform Intubation, CPR, defibrillation or ACLS   In the event of cardiac or respiratory ARREST Use medication by any route, position, wound care, and other measures to relive pain and suffering. May use oxygen, suction and manual treatment of airway obstruction as needed for comfort.   Comments nurse may pronounce      11/24/20 0756           Code Status History     Date Active Date Inactive Code Status Order ID Comments User Context   11/24/2020 0256 11/24/2020 0756 Full Code [id number]  Athena Masse, MD ED   12/07/2018 1332 12/10/2018 1841 Full Code [id number]  Lang Snow, NP ED      Family Communication: Daughter at the bedside Disposition Plan: Status is: Inpatient  Dispo: The patient is from: Home              Anticipated d/c is to: Home with home health, potentially tomorrow or the following day depending on how he is doing              Patient currently being treated for acute kidney injury severe sepsis and hyperkalemia   Difficult to place patient.  No.  Antibiotics: Rocephin Zithromax  Time spent: 28-minutes  [place]

## 2020-11-26 NOTE — Progress Notes (Signed)
   11/26/20 1020  Clinical Encounter Type  Visited With Patient  Visit Type Initial  Referral From Nurse  Consult/Referral To [place] visited room 2A-244A Pt, Robert Conrad. Pt is not cognitive therefore I was not able to do AD education. I notified the pt's nurse of the visits.

## 2020-11-26 NOTE — TOC Initial Note (Addendum)
Transition of Care [facility]) - Initial/Assessment Note    Patient Details  Name: Robert Conrad MRN: [id number] Date of Birth: Jun 01, 1940  Transition of Care [facility]) CM/SW Contact:    Alberteen Sam, LCSW Phone Number: 11/26/2020, 11:36 AM  Clinical Narrative:                  Update: CSW spoke with patient's daughter who reports they would like to move forward with home health services, agreeable to referrals to be sent out for acceptance. She reports they are interested in private pay home care agencies, CSW has emailed list to [email or web address]. She reports home health agency that accepts should call patient's spouse Izora Gala at [phone] to schedule appointments. Reports they are agreeable to DME rolling walker and 3in1 to be delivered to room via [employer] prior to dc. [facility] has accepted for [facility] PT, OT, RN and aide.     CSW spoke with patient's son Fritz Pickerel regarding PT recommendation of SNF, prefers [facility] if they go that route. However, reports he his having a family meeting for them to decided if they want patient to go to a facility or home with home health. Reports he will call this CSW after family meeting today to inform of what they decided.   Expected Discharge Plan:  (TBD) Barriers to Discharge: Continued Medical Work up   Patient Goals and CMS Choice   CMS Medicare.gov Compare Post Acute Care list provided to:: Patient Represenative (must comment) (son Fritz Pickerel) Choice offered to / list presented to : Adult Children  Expected Discharge Plan and Services Expected Discharge Plan:  (TBD)       Living arrangements for the past 2 months: Single Family Home                                      Prior Living Arrangements/Services Living arrangements for the past 2 months: Single Family Home Lives with:: Spouse Patient language and need for interpreter reviewed:: Yes Do you feel safe going back to the place where you live?: No   recommendation for short term rehab   Need for Family Participation in Patient Care: Yes (Comment) Care giver support system in place?: Yes (comment)   Criminal Activity/Legal Involvement Pertinent to Current Situation/Hospitalization: No - Comment as needed  Activities of Daily Living Home Assistive Devices/Equipment: Hearing aid, CBG Meter ADL Screening (condition at time of admission) Patient's cognitive ability adequate to safely complete daily activities?: Yes Is the patient deaf or have difficulty hearing?: No Does the patient have difficulty seeing, even when wearing glasses/contacts?: No Does the patient have difficulty concentrating, remembering, or making decisions?: Yes Patient able to express need for assistance with ADLs?: Yes Does the patient have difficulty dressing or bathing?: No Independently performs ADLs?: Yes (appropriate for developmental age) Does the patient have difficulty walking or climbing stairs?: No Weakness of Legs: None Weakness of Arms/Hands: None  Permission Sought/Granted Permission sought to share information with : Case Manager, Customer service manager, Family Supports Permission granted to share information with : Yes, Verbal Permission Granted  Share Information with NAME: Fritz Pickerel  Permission granted to share info w AGENCY: SNFs  Permission granted to share info w Relationship: son  Permission granted to share info w Contact Information: [phone]  Emotional Assessment         Alcohol / Substance Use: Not Applicable Psych Involvement: No (comment)  Admission diagnosis:  Confusion [  R41.0] Delirium [R41.0] Severe sepsis ([place]) [A41.9, R65.20] Urinary tract infection with hematuria, site unspecified [N39.0, R31.9] Sepsis with acute renal failure without septic shock, due to unspecified organism, unspecified acute renal failure type ([place]) [A41.9, R65.20, N17.9] Patient Active Problem List   Diagnosis Date Noted   Hyperkalemia 11/24/2020   UTI (urinary tract  infection) 81/02/3158   Acute metabolic encephalopathy 45/85/9292   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[place]) 11/24/2020   Sepsis ([place]) 11/24/2020   Severe sepsis ([place]) 11/24/2020   Hypotension    Acute delirium    Altered mental status 12/07/2018   PAD (peripheral artery disease) ([place]) 12/17/2016   Diabetes ([place]) 12/17/2016   Essential hypertension 12/17/2016   Hyperlipidemia 12/17/2016   Renal artery stenosis ([place]) 10/05/2013   CAD (coronary artery disease), 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/27/2013   History of colon polyps 02/24/2013   Abdominal aortic aneurysm ([place]) 02/24/2013   PCP:  Albina Billet, MD Pharmacy:   [facility] [address], [place] [address] Phone: [phone] Fax: [facility] - [place], [address] [address] Phone: [phone] Fax: [place], [place] - [address] [address] Phone: [phone] Fax: [phone]  [id number] [place], [place] [address] [place] [address] Phone: [phone] Fax: [phone]     Social Determinants of Health (SDOH) Interventions    Readmission Risk Interventions No flowsheet data found.

## 2020-11-26 NOTE — Progress Notes (Signed)
Clinical/Bedside Swallow Evaluation Patient Details  Name: Robert Conrad MRN: [id number] Date of Birth: Apr 23, 1941  Today's Date: 11/26/2020 Time: SLP Start Time (ACUTE ONLY): 27 SLP Stop Time (ACUTE ONLY): 9323 SLP Time Calculation (min) (ACUTE ONLY): 30 min  Past Medical History:  Past Medical History:  Diagnosis Date   Abdominal aneurysm without mention of rupture    Arthritis    Bowel trouble 1999   Diabetes mellitus without complication ([place]) 5573   non insulin dependent   Diverticulitis    Eye problems 2010   GERD (gastroesophageal reflux disease) 2009   Gout 2009   Hearing loss    Heart disease 1990   Hyperlipidemia 2009   Hypertension 2009   Obesity, unspecified    Personal history of colonic polyps    Personal history of tobacco use, presenting hazards to health    Renal insufficiency    Special screening for malignant neoplasms, colon    Stroke (cerebrum) ([place])    Stroke ([place]) 2009   Ulcer 2009   stomach   Past Surgical History:  Past Surgical History:  Procedure Laterality Date   CARDIAC CATHETERIZATION  09-30-13   COLONOSCOPY  2009   COLONOSCOPY  03-07-11   Dr Jamal Collin   COLONOSCOPY  03/07/2011   COLONOSCOPY WITH PROPOFOL N/A 07/16/2016   Procedure: COLONOSCOPY WITH PROPOFOL;  Surgeon: Christene Lye, MD;  Location: [facility] ENDOSCOPY;  Service: Endoscopy;  Laterality: N/A;   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   CORONARY ARTERY BYPASS GRAFT     EYE SURGERY  2009   HERNIA REPAIR  2010   ventral and umbilical    STOMACH SURGERY     sigmoid colon resection   HPI:  Robert Conrad is a 80 y.o. male with medical history significant for DM, HTN, PAD, AAA, renal artery stenosis with CKD 4, suspected dementia, and history of CABG at age 41 who was brought to the emergency room with a several day history of altered mental status and increasing confusion, fever and foul-smelling urine. CT abdomen and pelvis nonacute.  4.2 cm infrarenal AAA. Patient  admitted 11/24/2020 for severe sepsis secondary to UTI, hyperkalemia, acute metabolic encephalopathy, dementia, AKI on CKDIV, R renal artery stenosis.   Assessment / Plan / Recommendation Clinical Impression   Patient appears to have adequate airway protection when consuming thin liquids, puree, and mechanical soft solids (cracker, banana). Mild aspiration risk due to mentation/cognitive impairment as pt is distractible and looses attention to bolus at times. Pt alert and following simple 1-step commands, though he is confused. He also has visual deficits which impact his ability to self-feed. With assistance and repeated cuing, pt self-fed sips of water via cup and straw, puree via teaspoon, and cracker/banana when placed in his hand and tactile cue provided for bringing hand to mouth. Mastication at times prolonged due to distractibility, however pt demonstrated good oral clearance. Daughter denies any difficulties with swallowing with the exception of larger pills, occasionally. She reports current mentation is consistent with pt's baseline. She reports he will no eat unless someone is assisting/encouraging him to do so. SLP educated on aspiration precautions and strategies to increase safety at mealtimes, such as upright positioning, allowing/assisting pt to participate in feeding himself, and reducing distractions/minimizing conversations. Recommend dysphagia 3 (mechanical soft) diet to reduce efforts of mastication and due to impaired attention, thin liquids, and meds whole in puree. SLP to follow up briefly for tolerance/education.    SLP Visit Diagnosis: Dysphagia, unspecified (R13.10)  Aspiration Risk  Mild aspiration risk    Diet Recommendation Dysphagia 3 (Mech soft);Thin liquid   Liquid Administration via: Cup;Straw Medication Administration: Whole meds with puree Supervision: Staff to assist with self feeding;Full supervision/cueing for compensatory strategies Compensations: Slow  rate;Small sips/bites;Minimize environmental distractions Postural Changes: Seated upright at 90 degrees    Other  Recommendations Oral Care Recommendations: Oral care BID   Follow up Recommendations Skilled Nursing facility      Frequency and Duration min 1 x/week  1 week       Prognosis Prognosis for Safe Diet Advancement: Fair Barriers to Reach Goals: Cognitive deficits      Swallow Study   General Date of Onset: 11/24/20 HPI: Robert Conrad is a 80 y.o. male with medical history significant for DM, HTN, PAD, AAA, renal artery stenosis with CKD 4, suspected dementia, and history of CABG at age 22 who was brought to the emergency room with a several day history of altered mental status and increasing confusion, fever and foul-smelling urine. CT abdomen and pelvis nonacute.  4.2 cm infrarenal AAA. Patient admitted 11/24/2020 for severe sepsis secondary to UTI, hyperkalemia, acute metabolic encephalopathy, dementia, AKI on CKDIV, R renal artery stenosis. Type of Study: Bedside Swallow Evaluation Previous Swallow Assessment: none on file Diet Prior to this Study: Dysphagia 1 (puree);Nectar-thick liquids Temperature Spikes Noted: No Respiratory Status: Room air History of Recent Intubation: No Behavior/Cognition: Alert;Confused;Distractible Oral Cavity Assessment: Within Functional Limits;Dry Oral Care Completed by SLP: Recent completion by staff Oral Cavity - Dentition: Dentures, top;Dentures, bottom Vision: Impaired for self-feeding Self-Feeding Abilities: Needs assist;Needs set up Patient Positioning: Upright in bed Baseline Vocal Quality: Hoarse Volitional Cough: Strong Volitional Swallow: Able to elicit    Oral/Motor/Sensory Function Overall Oral Motor/Sensory Function: Within functional limits   Ice Chips Ice chips: Within functional limits Presentation: Spoon   Thin Liquid Thin Liquid: Impaired Presentation: Cup;Straw Oral Phase Impairments: Poor awareness of bolus  (vision contributes) Oral Phase Functional Implications: Other (comment) (anterior spillage with cup sip of thin (vision contributes)) Pharyngeal  Phase Impairments:  (none observed)    Nectar Thick Nectar Thick Liquid: Not tested   Honey Thick Honey Thick Liquid: Not tested   Puree Puree: Within functional limits Presentation: Spoon;Self Fed   Solid     Solid: Impaired Presentation: Self Fed Oral Phase Impairments: Other (comment) (distractible; loses focus easily) Oral Phase Functional Implications: Other (comment) (prolonged mastication) Pharyngeal Phase Impairments:  (none observed)     Deneise Lever, MS, CCC-SLP Speech-Language Pathologist  Aliene Altes 11/26/2020,2:15 PM

## 2020-11-27 ENCOUNTER — Inpatient Hospital Stay: Payer: Medicare HMO

## 2020-11-27 DIAGNOSIS — N179 Acute kidney failure, unspecified: Secondary | ICD-10-CM | POA: Diagnosis not present

## 2020-11-27 DIAGNOSIS — R410 Disorientation, unspecified: Secondary | ICD-10-CM | POA: Diagnosis not present

## 2020-11-27 DIAGNOSIS — A419 Sepsis, unspecified organism: Secondary | ICD-10-CM | POA: Diagnosis not present

## 2020-11-27 DIAGNOSIS — J189 Pneumonia, unspecified organism: Secondary | ICD-10-CM | POA: Diagnosis not present

## 2020-11-27 LAB — BASIC METABOLIC PANEL
Anion gap: 7 (ref 5–15)
BUN: 33 mg/dL — ABNORMAL HIGH (ref 8–23)
CO2: 30 mmol/L (ref 22–32)
Calcium: 9 mg/dL (ref 8.9–10.3)
Chloride: 99 mmol/L (ref 98–111)
Creatinine, Ser: 1.76 mg/dL — ABNORMAL HIGH (ref 0.61–1.24)
GFR, Estimated: 38 mL/min — ABNORMAL LOW (ref >60)
Glucose, Bld: 112 mg/dL — ABNORMAL HIGH (ref 70–99)
Potassium: 5.4 mmol/L — ABNORMAL HIGH (ref 3.5–5.1)
Sodium: 136 mmol/L (ref 135–145)

## 2020-11-27 MED ORDER — AMLODIPINE BESYLATE 5 MG PO TABS
5.0000 mg | ORAL_TABLET | Freq: Every day | ORAL | Status: DC
  Administered 2020-11-28: 5 mg via ORAL
  Filled 2020-11-27: qty 1

## 2020-11-27 MED ORDER — HALOPERIDOL 2 MG PO TABS
2.0000 mg | ORAL_TABLET | Freq: Three times a day (TID) | ORAL | Status: DC | PRN
  Administered 2020-11-27: 2 mg via ORAL
  Filled 2020-11-27 (×2): qty 1

## 2020-11-27 MED ORDER — SODIUM ZIRCONIUM CYCLOSILICATE 10 G PO PACK
10.0000 g | PACK | Freq: Once | ORAL | Status: DC
  Filled 2020-11-27: qty 1

## 2020-11-27 NOTE — Care Management Important Message (Signed)
Important Message  Patient Details  Name: Kortez Murtagh Calia MRN: [id number] Date of Birth: 1940/06/15   Medicare Important Message Given:  Yes     Dannette Barbara 11/27/2020, 4:29 PM

## 2020-11-27 NOTE — Progress Notes (Signed)
Pt agitated this morning. Refusing to eat anything. Pt took 1 sip of Lokelma and would not take any applesauce (antibiotic crushed in applesauce). Pt agitated this morning. Administered 2mg  IV PRN Haldol. MD made aware of unsuccessful med administration. Will continue to monitor.

## 2020-11-27 NOTE — Progress Notes (Signed)
PT Cancellation Note  Patient Details Name: Robert Conrad MRN: [id number] DOB: 26-Mar-1941   Cancelled Treatment:     PT attempt. PT hold. Pt was given Haldol ~ 20 minutes prior with safety sitter in place. Pt currently asleep and not appropriate for PT. Will return when pt is more appropriate to participate.    Willette Pa 11/27/2020, 9:53 AM

## 2020-11-27 NOTE — Progress Notes (Signed)
Patient ID: Robert Conrad, male   DOB: 16-Aug-1940, 80 y.o.   MRN: [id number] [facility] PROGRESS NOTE  Avedis Bevis Wilbourne WJX:[id number] DOB: Jun 19, 1940 DOA: 11/24/2020 PCP: Albina Billet, MD  HPI/Subjective: Patient was given Haldol this morning and was sleeping.  On reevaluation he was a little confused.  We were able to feed him a little bit of lunch.  Patient admitted with altered mental status and clinical sepsis.  Objective: Vitals:   11/27/20 1135 11/27/20 1636  BP: (!) 157/75 (!) 138/106  Pulse: (!) 57 (!) 105  Resp:    Temp: 98.5 F (36.9 C)   SpO2: 96% 97%    Intake/Output Summary (Last 24 hours) at 11/27/2020 1741 Last data filed at 11/27/2020 1718 Gross per 24 hour  Intake 1540.1 ml  Output 2000 ml  Net -459.9 ml   Filed Weights   11/23/20 2239 11/25/20 0000  Weight: 90 kg 78.8 kg    ROS: Review of Systems  Unable to perform ROS: Acuity of condition  Exam: Physical Exam HENT:     Head: Normocephalic.     Mouth/Throat:     Pharynx: No oropharyngeal exudate.  Eyes:     General: Lids are normal.     Conjunctiva/sclera: Conjunctivae normal.  Cardiovascular:     Rate and Rhythm: Normal rate and regular rhythm.     Heart sounds: Normal heart sounds, S1 normal and S2 normal.  Pulmonary:     Breath sounds: Examination of the right-lower field reveals decreased breath sounds. Examination of the left-lower field reveals decreased breath sounds. Decreased breath sounds present. No wheezing, rhonchi or rales.  Abdominal:     Palpations: Abdomen is soft.     Tenderness: There is no abdominal tenderness.  Musculoskeletal:     Right lower leg: No swelling.     Left lower leg: No swelling.  Skin:    General: Skin is warm.     Comments: Bruising around left elbow.  Neurological:     Mental Status: He is disoriented.     Data Reviewed: Basic Metabolic Panel: Recent Labs  Lab 11/23/20 2246 11/24/20 0433 11/25/20 0447 11/26/20 0510 11/27/20 0529   NA 136  --  137 135 136  K 6.4* 5.6* 5.4* 5.4* 5.4*  CL 107  --  106 103 99  CO2 23  --  26 25 30   GLUCOSE 161*  --  94 127* 112*  BUN 76*  --  46* 32* 33*  CREATININE 4.11* 3.49* 2.47* 1.85* 1.76*  CALCIUM 9.0  --  9.1 8.9 9.0   Liver Function Tests: Recent Labs  Lab 11/23/20 2246  AST 17  ALT 26  ALKPHOS 38  BILITOT 0.8  PROT 6.4*  ALBUMIN 3.3*   CBC: Recent Labs  Lab 11/23/20 2246 11/24/20 0327 11/24/20 0433  WBC 21.8* 18.8* 19.5*  NEUTROABS 17.2*  --   --   HGB 13.6 11.4* 12.3*  HCT 42.7 35.1* 38.6*  MCV 89.7 88.6 90.4  PLT 127* 94* 107*     CBG: Recent Labs  Lab 11/25/20 0410 11/25/20 0858 11/25/20 1239 11/25/20 1724 11/25/20 2252  GLUCAP 88 98 169* 140* 119*    Recent Results (from the past 240 hour(s))  Blood Culture (routine x 2)     Status: None (Preliminary result)   Collection Time: 11/24/20 12:41 AM   Specimen: BLOOD  Result Value Ref Range Status   Specimen Description BLOOD RIGHT ANTECUBITAL  Final   Special Requests   Final  BOTTLES DRAWN AEROBIC AND ANAEROBIC Blood Culture results may not be optimal due to an excessive volume of blood received in culture bottles   Culture   Final    NO GROWTH 3 DAYS Performed at [facility], [address] [place], [place] [address]    Report Status PENDING  Incomplete  Urine Culture     Status: Abnormal   Collection Time: 11/24/20 12:41 AM   Specimen: Urine, Random  Result Value Ref Range Status   Specimen Description   Final    URINE, RANDOM Performed at [facility], [address] [place], [place] [address]    Special Requests   Final    NONE Performed at [facility], [place], [place] [address]    Culture 10,000 COLONIES/mL ESCHERICHIA COLI (A)  Final   Report Status 11/26/2020 FINAL  Final   Organism ID, Bacteria ESCHERICHIA COLI (A)  Final      Susceptibility   Escherichia coli - MIC*    AMPICILLIN >=32 RESISTANT Resistant      CEFAZOLIN <=4 SENSITIVE Sensitive     CEFEPIME <=0.12 SENSITIVE Sensitive     CEFTRIAXONE <=0.25 SENSITIVE Sensitive     CIPROFLOXACIN <=0.25 SENSITIVE Sensitive     GENTAMICIN <=1 SENSITIVE Sensitive     IMIPENEM <=0.25 SENSITIVE Sensitive     NITROFURANTOIN <=16 SENSITIVE Sensitive     TRIMETH/SULFA <=20 SENSITIVE Sensitive     AMPICILLIN/SULBACTAM >=32 RESISTANT Resistant     PIP/TAZO <=4 SENSITIVE Sensitive     * 10,000 COLONIES/mL ESCHERICHIA COLI  Resp Panel by RT-PCR (Flu A&B, Covid) Nasopharyngeal Swab     Status: None   Collection Time: 11/24/20 12:47 AM   Specimen: Nasopharyngeal Swab; Nasopharyngeal(NP) swabs in vial transport medium  Result Value Ref Range Status   SARS Coronavirus 2 by RT PCR NEGATIVE NEGATIVE Final    Comment: (NOTE) SARS-CoV-2 target nucleic acids are NOT DETECTED.  The SARS-CoV-2 RNA is generally detectable in upper respiratory specimens during the acute phase of infection. The lowest concentration of SARS-CoV-2 viral copies this assay can detect is 138 copies/mL. A negative result does not preclude SARS-Cov-2 infection and should not be used as the sole basis for treatment or other patient management decisions. A negative result may occur with  improper specimen collection/handling, submission of specimen other than nasopharyngeal swab, presence of viral mutation(s) within the areas targeted by this assay, and inadequate number of viral copies(<138 copies/mL). A negative result must be combined with clinical observations, patient history, and epidemiological information. The expected result is Negative.  Fact Sheet for Patients:  EntrepreneurPulse.com.au  Fact Sheet for Healthcare Providers:  IncredibleEmployment.be  This test is no t yet approved or cleared by the [place] FDA and  has been authorized for detection and/or diagnosis of SARS-CoV-2 by FDA under an Emergency Use Authorization (EUA). This  EUA will remain  in effect (meaning this test can be used) for the duration of the COVID-19 declaration under Section 564(b)(1) of the Act, 21 U.S.C.section 360bbb-3(b)(1), unless the authorization is terminated  or revoked sooner.       Influenza A by PCR NEGATIVE NEGATIVE Final   Influenza B by PCR NEGATIVE NEGATIVE Final    Comment: (NOTE) The Xpert Xpress SARS-CoV-2/FLU/RSV plus assay is intended as an aid in the diagnosis of influenza from Nasopharyngeal swab specimens and should not be used as a sole basis for treatment. Nasal washings and aspirates are unacceptable for Xpert Xpress SARS-CoV-2/FLU/RSV testing.  Fact  Sheet for Patients: EntrepreneurPulse.com.au  Fact Sheet for Healthcare Providers: IncredibleEmployment.be  This test is not yet approved or cleared by the [place] FDA and has been authorized for detection and/or diagnosis of SARS-CoV-2 by FDA under an Emergency Use Authorization (EUA). This EUA will remain in effect (meaning this test can be used) for the duration of the COVID-19 declaration under Section 564(b)(1) of the Act, 21 U.S.C. section 360bbb-3(b)(1), unless the authorization is terminated or revoked.  Performed at [facility], [address] [place], [place] [address]   Blood Culture (routine x 2)     Status: None (Preliminary result)   Collection Time: 11/24/20  1:08 AM   Specimen: BLOOD  Result Value Ref Range Status   Specimen Description BLOOD BLOOD LEFT HAND  Final   Special Requests   Final    BOTTLES DRAWN AEROBIC AND ANAEROBIC Blood Culture results may not be optimal due to an inadequate volume of blood received in culture bottles   Culture   Final    NO GROWTH 3 DAYS Performed at [facility], [address] [place],  [address]    Report Status PENDING  Incomplete     Studies: CT HEAD WO CONTRAST  Result Date: 11/27/2020 CLINICAL DATA:  Mental status change,  unknown cause EXAM: CT HEAD WITHOUT CONTRAST TECHNIQUE: Contiguous axial images were obtained from the base of the skull through the vertex without intravenous contrast. COMPARISON:  10/26/2020 FINDINGS: Brain: There is no acute intracranial hemorrhage, mass effect, or edema. Gray-white differentiation is preserved. There is no extra-axial fluid collection. 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 Chronic small vessel infarct adjacent to the right frontal horn also again noted. Additional patchy areas of hypoattenuation in the supratentorial white matter are nonspecific but probably reflects similar chronic microvascular ischemic changes. Prominence of the ventricles and sulci reflects stable parenchymal volume loss. Vascular: There is atherosclerotic calcification at the skull base. Skull: Calvarium is unremarkable. Sinuses/Orbits: Mild mucosal thickening. Bilateral lens replacements. Other: None. IMPRESSION: No acute intracranial abnormality. No change since recent prior study with stable chronic/nonemergent findings detailed above. Electronically Signed   By: Macy Mis M.D.   On: 11/27/2020 12:59   DG Chest Port 1 View  Result Date: 11/26/2020 CLINICAL DATA:  Concern for sepsis EXAM: PORTABLE CHEST 1 VIEW COMPARISON:  11/23/2020 FINDINGS: New small nodular opacity mid left lung. Minimal patchy density at the lung bases. No pleural effusion or pneumothorax. Stable cardiomediastinal contours. IMPRESSION: New small nodular opacity mid left lung. Mid bibasilar patchy density. May reflect infectious process in the appropriate setting. Electronically Signed   By: Macy Mis M.D.   On: 11/26/2020 08:15    Scheduled Meds:  azithromycin  250 mg Oral Daily   enoxaparin (LOVENOX) injection  40 mg Subcutaneous Q24H   metoprolol succinate  50 mg Oral QHS   QUEtiapine  12.5 mg Oral QHS   sodium zirconium cyclosilicate  10 g Oral Daily   tamsulosin  0.4 mg Oral QPC supper   Continuous  Infusions:  cefTRIAXone (ROCEPHIN)  IV Stopped (11/27/20 1256)   lactated ringers 40 mL/hr at 11/27/20 1620   Brief history.  Patient admitted 11/24/2020 with altered mental status and foul order of the urine.  Had 1 dose of antibiotic as outpatient and was admitted with severe sepsis with hypotension acute metabolic encephalopathy and acute kidney injury with leukocytosis fever tachycardia and tachypnea.  Urine culture only grew out of 10,000 E. coli.  Repeat chest x-ray did  show possibility of pneumonia.  Rocephin was started on presentation and Zithromax was added.  Patient had more agitation this morning was given Haldol and then was unresponsive this morning.  Patient more alert this afternoon but still not back to his baseline mental status.  Assessment/Plan:  Severe sepsis, initial hypotension, acute metabolic encephalopathy, acute kidney injury with leukocytosis, fever tachycardia and tachypnea on presentation.  Present on admission.  Urine culture only grew out 10,000 E. coli.  Blood cultures are negative.  Repeat chest x-ray did show possible pneumonia bilateral bases.  Patient has been on Rocephin since admission and added Zithromax yesterday. Acute delirium with underlying dementia.  Continue Seroquel at night.  With Haldol today he was less responsive.  Change Haldol to orally as needed.  CT head today negative for acute event Acute kidney injury on chronic kidney disease stage IIIb.  Creatinine 4.11 on presentation and down to 1.76. Hyperkalemia.  Persistent potassium at 5.4.  Given Lokelma on a daily basis.  We will give a second dose this evening and recheck BMP tomorrow morning Essential hypertension.  Patient initially had hypotension upon coming in.  All blood pressure medications were held.  I restarted Toprol the other day.  Restart Norvasc today. BPH on Flomax. Weakness.  Physical therapy recommending rehab.  Patient's family would like to take him home. 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  Follow-up as  outpatient        Code Status:     Code Status Orders  (From admission, onward)           Start     Ordered   11/24/20 0757  Do not attempt resuscitation (DNR)  Continuous       Question Answer Comment  In the event of cardiac or respiratory ARREST Do not call a "code blue"   In the event of cardiac or respiratory ARREST Do not perform Intubation, CPR, defibrillation or ACLS   In the event of cardiac or respiratory ARREST Use medication by any route, position, wound care, and other measures to relive pain and suffering. May use oxygen, suction and manual treatment of airway obstruction as needed for comfort.   Comments nurse may pronounce      11/24/20 0756           Code Status History     Date Active Date Inactive Code Status Order ID Comments User Context   11/24/2020 0256 11/24/2020 0756 Full Code [id number]  Athena Masse, MD ED   12/07/2018 1332 12/10/2018 1841 Full Code [id number]  Lang Snow, NP ED      Family Communication: Spoke with son and daughter on the phone Disposition Plan: Status is: Inpatient  Dispo: The patient is from: Home              Anticipated d/c is to: Home once mental status improved              Patient currently being treated for severe sepsis and hyperkalemia   Difficult to place patient.  No.  Antibiotics: Rocephin Zithromax  Time spent: 29 minutes  [place]

## 2020-11-27 NOTE — Progress Notes (Signed)
Physical Therapy Treatment Patient Details Name: Robert Conrad MRN: [id number] DOB: Feb 04, 1941 Today's Date: 11/27/2020    History of Present Illness Robert Conrad is a 80 y.o. male with medical history significant for DM, HTN, PAD, AAA, renal artery stenosis with CKD 4, suspected dementia, and history of CABG at age 25 who was brought to the emergency room with a several day history of altered mental status and increasing confusion, fever and foul-smelling urine. CT abdomen and pelvis nonacute.  4.2 cm infrarenal AAA. Patient admitted 11/24/2020 for severe sepsis secondary to UTI, hyperkalemia, acute metabolic encephalopathy, dementia, AKI on CKDIV, R renal artery stenosis.    PT Comments    Pt was long sitting in bed awake with sitter, grandson, and granddaughter all at bedside. Pt is confused and disoriented however able to follow some simple commands. Cognition impacts session more so than physical impairments. He was able to exit R side of bed, stand, and ambulate 200 ft. Author attempted to have pt push IV pole however pt unable to motor plan/coordinate desired task.Highly recommend RN staff/family use RW at all times when on his feet. Fritz Pickerel did require several occasions of interventions during ambulation to prevent falling. Heavy use of gait belt for assistance. If cognition does not improve, pt may be more suitable for memory care unit at DC. Currently acute PT recommend DC to SNF to address deficits with balance, activity tolerance, and overall safety with mobility.     Follow Up Recommendations  SNF;Supervision/Assistance - 24 hour;Other (comment) (If cognition does not improve, may be appropriate for memory care unit.)     Equipment Recommendations  Other (comment) (defer to next level of care.)       Precautions / Restrictions Precautions Precautions: Fall Restrictions Weight Bearing Restrictions: No    Mobility  Bed Mobility Overal bed mobility: Needs  Assistance Bed Mobility: Supine to Sit;Sit to Supine     Supine to sit: Min assist Sit to supine: Min assist   General bed mobility comments: Min assist more so due to cognition then strength. pt struggles with sequencing and motor planning.    Transfers Overall transfer level: Needs assistance Equipment used: Rolling walker (2 wheeled) Transfers: Sit to/from Stand Sit to Stand: Min guard         General transfer comment: Pt was easily able to stand without AD.  Ambulation/Gait Ambulation/Gait assistance: Min assist Gait Distance (Feet): 200 Feet Assistive device: None Gait Pattern/deviations: Drifts right/left;Staggering right;Staggering left;Narrow base of support;Scissoring Gait velocity: decreased   General Gait Details: Pt is high fall risk without AD. will ambulate next session with RW. pt has several occasions of LOB with intervention to prevent fall. Cognition greatly limits session. HR elevation to 120s      Balance Overall balance assessment: History of Falls;Needs assistance Sitting-balance support: Feet supported Sitting balance-Leahy Scale: Fair Sitting balance - Comments: no LOB in sitting with feet support only   Standing balance support: During functional activity;No upper extremity supported Standing balance-Leahy Scale: Poor Standing balance comment: Pt high fall risk without AD. Attempted to have pt push IV pole however unable to follow commands well. Highly recommend use of AD at all times         Cognition Arousal/Alertness: Awake/alert Behavior During Therapy: Impulsive;Restless;Anxious (tremortous) Overall Cognitive Status: History of cognitive impairments - at baseline Area of Impairment: Following commands;Safety/judgement;Orientation;Attention;Memory;Awareness;Problem solving          General Comments: Pt's family in room reports pt was starting to have cognition deficits  prior to [employer]. Dementia?             Pertinent  Vitals/Pain Pain Assessment: No/denies pain     PT Goals (current goals can now be found in the care plan section) Acute Rehab PT Goals Patient Stated Goal: none stated Progress towards PT goals: Progressing toward goals    Frequency    Min 2X/week      PT Plan Current plan remains appropriate       AM-PAC PT "6 Clicks" Mobility   Outcome Measure  Help needed turning from your back to your side while in a flat bed without using bedrails?: A Lot Help needed moving from lying on your back to sitting on the side of a flat bed without using bedrails?: A Lot Help needed moving to and from a bed to a chair (including a wheelchair)?: A Little Help needed standing up from a chair using your arms (e.g., wheelchair or bedside chair)?: A Little Help needed to walk in hospital room?: A Lot Help needed climbing 3-5 steps with a railing? : A Lot 6 Click Score: 14    End of Session Equipment Utilized During Treatment: Gait belt (heavy use of gait belt to prevent fall) Activity Tolerance: Patient tolerated treatment well;Other (comment) (session mostly impacted by cognition) Patient left: in bed;with call bell/phone within reach;with nursing/sitter in room;with family/visitor present Nurse Communication: Mobility status PT Visit Diagnosis: Unsteadiness on feet (R26.81);Difficulty in walking, not elsewhere classified (R26.2);History of falling (Z91.81);Muscle weakness (generalized) (M62.81)     Time: 5170-0174 PT Time Calculation (min) (ACUTE ONLY): 24 min  Charges:  $Gait Training: 8-22 mins $Therapeutic Activity: 8-22 mins                     Julaine Fusi PTA 11/27/20, 3:03 PM

## 2020-11-28 DIAGNOSIS — R319 Hematuria, unspecified: Secondary | ICD-10-CM

## 2020-11-28 DIAGNOSIS — A419 Sepsis, unspecified organism: Secondary | ICD-10-CM | POA: Diagnosis not present

## 2020-11-28 DIAGNOSIS — G9341 Metabolic encephalopathy: Secondary | ICD-10-CM | POA: Diagnosis not present

## 2020-11-28 DIAGNOSIS — N390 Urinary tract infection, site not specified: Secondary | ICD-10-CM | POA: Diagnosis not present

## 2020-11-28 DIAGNOSIS — I10 Essential (primary) hypertension: Secondary | ICD-10-CM | POA: Diagnosis not present

## 2020-11-28 LAB — CBC
HCT: 43.4 % (ref 39.0–52.0)
Hemoglobin: 14 g/dL (ref 13.0–17.0)
MCH: 28.6 pg (ref 26.0–34.0)
MCHC: 32.3 g/dL (ref 30.0–36.0)
MCV: 88.8 fL (ref 80.0–100.0)
Platelets: 148 10*3/uL — ABNORMAL LOW (ref 150–400)
RBC: 4.89 MIL/uL (ref 4.22–5.81)
RDW: 13.6 % (ref 11.5–15.5)
WBC: 8.9 10*3/uL (ref 4.0–10.5)
nRBC: 0 % (ref 0.0–0.2)

## 2020-11-28 LAB — BASIC METABOLIC PANEL
Anion gap: 9 (ref 5–15)
BUN: 32 mg/dL — ABNORMAL HIGH (ref 8–23)
CO2: 26 mmol/L (ref 22–32)
Calcium: 9 mg/dL (ref 8.9–10.3)
Chloride: 100 mmol/L (ref 98–111)
Creatinine, Ser: 1.74 mg/dL — ABNORMAL HIGH (ref 0.61–1.24)
GFR, Estimated: 39 mL/min — ABNORMAL LOW (ref >60)
Glucose, Bld: 128 mg/dL — ABNORMAL HIGH (ref 70–99)
Potassium: 5 mmol/L (ref 3.5–5.1)
Sodium: 135 mmol/L (ref 135–145)

## 2020-11-28 MED ORDER — RISPERIDONE 0.25 MG PO TABS: 0.2500 mg | ORAL_TABLET | ORAL | Status: DC

## 2020-11-28 MED ORDER — TOUJEO SOLOSTAR 300 UNIT/ML ~~LOC~~ SOPN: 10.0000 [IU] | PEN_INJECTOR | Freq: Every day | SUBCUTANEOUS | 1 refills | Status: DC

## 2020-11-28 MED ORDER — AMLODIPINE BESYLATE 5 MG PO TABS: 5.0000 mg | ORAL_TABLET | Freq: Every day | ORAL | Status: DC

## 2020-11-28 MED ORDER — TORSEMIDE 20 MG PO TABS: 20.0000 mg | ORAL_TABLET | Freq: Every day | ORAL | Status: DC

## 2020-11-28 MED ORDER — QUETIAPINE FUMARATE 25 MG PO TABS: 12.5000 mg | ORAL_TABLET | Freq: Every day | ORAL | 0 refills | Status: DC

## 2020-11-28 MED ORDER — METOPROLOL SUCCINATE ER 25 MG PO TB24: 25.0000 mg | ORAL_TABLET | Freq: Two times a day (BID) | ORAL | Status: DC

## 2020-11-28 MED ORDER — RISPERIDONE 0.25 MG PO TABS
0.2500 mg | ORAL_TABLET | Freq: Every day | ORAL | Status: DC
  Filled 2020-11-28: qty 1

## 2020-11-28 MED ORDER — ASPIRIN EC 81 MG PO TBEC: 81.0000 mg | DELAYED_RELEASE_TABLET | Freq: Every day | ORAL | Status: DC

## 2020-11-28 MED ORDER — TAMSULOSIN HCL 0.4 MG PO CAPS: 0.4000 mg | ORAL_CAPSULE | Freq: Every day | ORAL | Status: DC

## 2020-11-28 MED ORDER — DIVALPROEX SODIUM 500 MG PO DR TAB
500.0000 mg | DELAYED_RELEASE_TABLET | Freq: Two times a day (BID) | ORAL | Status: DC
  Filled 2020-11-28 (×2): qty 1

## 2020-11-28 MED ORDER — SIMVASTATIN 20 MG PO TABS: 20.0000 mg | ORAL_TABLET | Freq: Every day | ORAL | Status: DC

## 2020-11-28 MED ORDER — CEFUROXIME AXETIL 500 MG PO TABS: 500.0000 mg | ORAL_TABLET | Freq: Two times a day (BID) | ORAL | 0 refills | Status: AC

## 2020-11-28 MED ORDER — CEFUROXIME AXETIL 500 MG PO TABS: 500.0000 mg | ORAL_TABLET | Freq: Two times a day (BID) | ORAL | Status: DC

## 2020-11-28 MED ORDER — ADULT MULTIVITAMIN W/MINERALS CH: 1.0000 | ORAL_TABLET | Freq: Every day | ORAL | Status: DC

## 2020-11-28 MED ORDER — RISPERIDONE 0.5 MG PO TABS: 0.5000 mg | ORAL_TABLET | Freq: Every day | ORAL | Status: DC

## 2020-11-28 NOTE — Progress Notes (Signed)
Speech Language Pathology Treatment: Dysphagia  Patient Details Name: Robert Conrad MRN: [id number] DOB: 04/06/1941 Today's Date: 11/28/2020 Time: 0825-0910 SLP Time Calculation (min) (ACUTE ONLY): 45 min  Assessment / Plan / Recommendation Clinical Impression  Pt appears to present w/ adequate oropharyngeal phase swallowing w/ No overt deficits noted in light of declined Cognitive status. The Cognitive decline/Dementia (suspected per MD) can impact his overall awareness/timing of swallow and safety during po tasks which increases risk for aspiration, choking. Cognitive decline can also impact overall oral intake, desire for taking po's. Any risk for aspiration can be reduced by following general aspiration precautions and using a modified diet consistency of Cut foods, moistened for ease of intake. Monitoring at meals is recommended d/t pt requiring verbal/visual/tactile cues for during po tasks to attend and follow through. Pt demonstrated MOD++ tangential speech and did not follow instructions consistently.   Pt consumed only few trials of thin liquids w/ this SLP; few bites of soft solids from breakfast tray w/ Sitter present. NO overt clinical s/s of aspiration noted w/ trials; no decline in vocal quality; no cough, and no decline in respiratory status during/post trials. Oral phase was adequate for bolus management and oral clearing of the boluses given. Mastication of soft solids was slightly prolonged -- suspect impact from Cognitive decline and inattention. Pt attempted self-feeding by Holding Cup to Drink but required Mod support and guidance d/t Cognitive decline.    D/t pt's Baseline, declined Cognitive status w/ risk for aspiration d/t Cognitive decline, recommend continue the dysphagia level 3(mech soft for ease of oral phase) w/ thin liquids; general aspiration precautions; reduce Distractions during meals and engage pt during po's at meal for self-feeding. Pills Whole vs Crushed in  Puree for safer swallowing. Support w/ feeding at meals as needed. MD/NSG updated. ST services recommends follow w/ Palliative Care for [place] and education re: impact of Cognitive decline/Dementia and Parkinson's Dis. on swallowing. ST services can follow pt at discharge for further education as needed -- pt appears at his Baseline from a swallowing standpoint. Largely suspect that pt's Dementia impact could hamper overall oral intake -- rec'd Dietician f/u for nutritional support. Precautions posted in room. MD/NSG updated.       HPI HPI: Robert Conrad Kidney is a 80 y.o. male with medical history significant for DM, HTN, PAD, AAA, renal artery stenosis with CKD 4, suspected dementia, and history of CABG at age 15 who was brought to the emergency room with a several day history of altered mental status and increasing confusion, fever and foul-smelling urine. CT abdomen and pelvis nonacute.  4.2 cm infrarenal AAA. Patient admitted 11/24/2020 for severe sepsis secondary to UTI, hyperkalemia, acute metabolic encephalopathy, dementia, AKI on CKDIV, R renal artery stenosis.      SLP Plan  All goals met       Recommendations  Diet recommendations: Dysphagia 3 (mechanical soft);Thin liquid Liquids provided via: Cup;Straw (monitor) Medication Administration: Whole meds with puree (vs need to Crush w/ Puree) Supervision: Staff to assist with self feeding;Full supervision/cueing for compensatory strategies Compensations: Minimize environmental distractions;Slow rate;Small sips/bites;Lingual sweep for clearance of pocketing;Follow solids with liquid Postural Changes and/or Swallow Maneuvers: Out of bed for meals;Seated upright 90 degrees;Upright 30-60 min after meal                General recommendations:  (Dietician f/u for support; Palliative Care f/u for [place], education) Oral Care Recommendations: Oral care BID;Oral care before and after PO;Staff/trained caregiver to provide oral care  Follow up  Recommendations: None SLP Visit Diagnosis: Dysphagia, unspecified (R13.10) (impact from Dementia, confusion) Plan: All goals met       GO                 Orinda Kenner, MS, CCC-SLP Speech Language Pathologist Rehab Services [phone] [facility] 11/28/2020, 12:20 PM

## 2020-11-28 NOTE — Discharge Summary (Signed)
[facility] [place] NAME: Robert Conrad    MR#:  [id number]  DATE OF BIRTH:  13-Jul-1940  DATE OF ADMISSION:  11/24/2020 ADMITTING PHYSICIAN: Athena Masse, MD  DATE OF DISCHARGE: 11/28/2020  PRIMARY CARE PHYSICIAN: Albina Billet, MD    ADMISSION DIAGNOSIS:  Confusion [R41.0] Delirium [R41.0] Severe sepsis ([place]) [A41.9, R65.20] Urinary tract infection with hematuria, site unspecified [N39.0, R31.9] Sepsis with acute renal failure without septic shock, due to unspecified organism, unspecified acute renal failure type ([place]) [A41.9, R65.20, N17.9]  DISCHARGE DIAGNOSIS:  Severe Sepsis on admission--resolved Ecoli UTI Pneumonia Memory loss/agitation/Cognitive decline  SECONDARY DIAGNOSIS:   Past Medical History:  Diagnosis Date  . 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 Arthritis   . Bowel trouble 1999  . Diabetes mellitus without complication ([place]) 4098   non insulin dependent  . Diverticulitis   . Eye problems 2010  . GERD (gastroesophageal reflux disease) 2009  . Gout 2009  . Hearing loss   . Heart disease 1990  . Hyperlipidemia 2009  . Hypertension 2009  . Obesity, unspecified   . Personal history of colonic polyps   . Personal history of tobacco use, presenting hazards to health   . Renal insufficiency   . Special screening for malignant neoplasms, colon   . Stroke (cerebrum) ([place])   . Stroke [facility]) 2009  . Ulcer 2009   stomach    HOSPITAL COURSE:  Jimi Schappert Rickels is a 80 y.o. male with medical history significant for DM, HTN, PAD, AAA, renal artery stenosis with CKD 4, suspected dementia, and history of CABG at age 40 who was brought to the emergency room with a several day history of altered mental status and increasing confusion, fever and foul-smelling urine.  # Severe sepsis, initial hypotension, acute metabolic encephalopathy, acute kidney injury with leukocytosis, fever tachycardia and tachypnea on  presentation.  Present on admission.  -- Urine culture only grew out 10,000 E. coli. --  Blood cultures are negative.  -- Repeat chest x-ray did show possible pneumonia bilateral bases.  -- Patient has been on Rocephin since admission--change to po abxs  Acute delirium with underlying possible  dementia.   --Continue Seroquel at night.  --  CT head today negative for acute event --cont depakote, risperdal and seroquel per Dr Lannie Fields rec. Pt to follow up as out pt. Dter aware  Acute kidney injury on chronic kidney disease stage IIIb.  Creatinine 4.11 on presentation and down to 1.76.  Hyperkalemia.  Persistent potassium at 5.4.  Given Lokelma on a daily basis.  We will give a second dose this evening and recheck BMP tomorrow morning --d/c losartan  Essential hypertension.  Patient initially had hypotension upon coming in.  All blood pressure medications were held.   --restarted Toprol and Norvasc  --d/c losartan due to Creat and K  BPH on Flomax.  Weakness.  Physical therapy recommending rehab.  Patient's family would like to take him home.  4.2 cm AAA.  Follow-up as outpatient   Overall remains stable Much awake and alert. D/c dter GOC and family wishes to take him home. HH to be arranged.  CONSULTS OBTAINED:    DRUG ALLERGIES:   Allergies  Allergen Reactions  . Ciprofloxacin Hcl Rash    DISCHARGE MEDICATIONS:   Allergies as of 11/28/2020       Reactions   Ciprofloxacin Hcl Rash        Medication List  STOP taking these medications    losartan 25 MG tablet Commonly known as: COZAAR       TAKE these medications    amLODipine 5 MG tablet Commonly known as: NORVASC Take 5 mg by mouth daily.   aspirin 81 MG tablet Take 81 mg by mouth daily.   cefUROXime 500 MG tablet Commonly known as: CEFTIN Take 1 tablet (500 mg total) by mouth 2 (two) times daily with a meal for 4 days. Start taking on: November 29, 2020 What changed:  medication strength how  much to take when to take this   divalproex 500 MG DR tablet Commonly known as: DEPAKOTE Take 500 mg by mouth 2 (two) times daily.   metoprolol succinate 25 MG 24 hr tablet Commonly known as: TOPROL-XL Take 25 mg by mouth 2 (two) times daily.   multivitamin tablet Take 1 tablet by mouth daily.   omeprazole 20 MG capsule Commonly known as: PRILOSEC Take 20 mg by mouth daily.   QUEtiapine 25 MG tablet Commonly known as: SEROQUEL Take 0.5 tablets (12.5 mg total) by mouth at bedtime.   risperiDONE 0.25 MG tablet Commonly known as: RISPERDAL Take 0.25-0.5 mg by mouth See admin instructions. Take 1 tablet (0.25mg ) by mouth every night for seven nights then take 2 tablets (0.5mg ) by mouth every night   simvastatin 20 MG tablet Commonly known as: ZOCOR Take 20 mg by mouth daily.   tamsulosin 0.4 MG Caps capsule Commonly known as: FLOMAX Take 0.4 mg by mouth daily.   torsemide 20 MG tablet Commonly known as: DEMADEX Take 20 mg by mouth daily.   Toujeo SoloStar 300 UNIT/ML Solostar Pen Generic drug: insulin glargine (1 Unit Dial) Inject 10 Units into the skin daily. What changed: how much to take               Durable Medical Equipment  (From admission, onward)           Start     Ordered   11/26/20 1332  For home use only DME 3 n 1  Once        11/26/20 1331   11/26/20 1332  For home use only DME Walker rolling  Once       Question Answer Comment  Walker: With 5 Inch Wheels   Patient needs a walker to treat with the following condition Unsteady gait when walking      11/26/20 1331            If you experience worsening of your admission symptoms, develop shortness of breath, life threatening emergency, suicidal or homicidal thoughts you must seek medical attention immediately by calling 911 or calling your MD immediately  if symptoms less severe.  You Must read complete instructions/literature along with all the possible adverse reactions/side  effects for all the Medicines you take and that have been prescribed to you. Take any new Medicines after you have completely understood and accept all the possible adverse reactions/side effects.   Please note  You were cared for by a hospitalist during your hospital stay. If you have any questions about your discharge medications or the care you received while you were in the hospital after you are discharged, you can call the unit and asked to speak with the hospitalist on call if the hospitalist that took care of you is not available. Once you are discharged, your primary care physician will handle any further medical issues. Please note that NO REFILLS for any discharge medications will  be authorized once you are discharged, as it is imperative that you return to your primary care physician (or establish a relationship with a primary care physician if you do not have one) for your aftercare needs so that they can reassess your need for medications and monitor your lab values. Today   SUBJECTIVE   Eating lunch being fed by dter at bedside. Awake and alert  VITAL SIGNS:  Blood pressure 121/72, pulse 65, temperature 98 F (36.7 C), resp. rate 17, height 5\' 8"  (1.727 m), weight 78.8 kg, SpO2 97 %.  I/O:   Intake/Output Summary (Last 24 hours) at 11/28/2020 1250 Last data filed at 11/28/2020 0500 Gross per 24 hour  Intake 899.21 ml  Output 3200 ml  Net -2300.79 ml    PHYSICAL EXAMINATION:  GENERAL:  80 y.o.-year-old patient lying in the bed with no acute distress.  LUNGS: Normal breath sounds bilaterally, no wheezing, rales,rhonchi or crepitation. No use of accessory muscles of respiration.  CARDIOVASCULAR: S1, S2 normal. No murmurs, rubs, or gallops.  ABDOMEN: Soft, non-tender, non-distended. Bowel sounds present. No organomegaly or mass.  EXTREMITIES: No pedal edema, cyanosis, or clubbing.  NEUROLOGIC:non focal PSYCHIATRIC:  patient is alert, cognitive decline SKIN: No obvious rash,  lesion, or ulcer.   DATA REVIEW:   CBC  Recent Labs  Lab 11/28/20 0604  WBC 8.9  HGB 14.0  HCT 43.4  PLT 148*    Chemistries  Recent Labs  Lab 11/23/20 2246 11/24/20 0433 11/28/20 0604  NA 136   < > 135  K 6.4*   < > 5.0  CL 107   < > 100  CO2 23   < > 26  GLUCOSE 161*   < > 128*  BUN 76*   < > 32*  CREATININE 4.11*   < > 1.74*  CALCIUM 9.0   < > 9.0  AST 17  --   --   ALT 26  --   --   ALKPHOS 38  --   --   BILITOT 0.8  --   --    < > = values in this interval not displayed.    Microbiology Results   Recent Results (from the past 240 hour(s))  Blood Culture (routine x 2)     Status: None (Preliminary result)   Collection Time: 11/24/20 12:41 AM   Specimen: BLOOD  Result Value Ref Range Status   Specimen Description BLOOD RIGHT ANTECUBITAL  Final   Special Requests   Final    BOTTLES DRAWN AEROBIC AND ANAEROBIC Blood Culture results may not be optimal due to an excessive volume of blood received in culture bottles   Culture   Final    NO GROWTH 4 DAYS Performed at [facility], [address] [place], [place] [address]    Report Status PENDING  Incomplete  Urine Culture     Status: Abnormal   Collection Time: 11/24/20 12:41 AM   Specimen: Urine, Random  Result Value Ref Range Status   Specimen Description   Final    URINE, RANDOM Performed at [facility], [address] [place], [place] [address]    Special Requests   Final    NONE Performed at [facility], [address] [place], [place] [address]    Culture 10,000 COLONIES/mL ESCHERICHIA COLI (A)  Final   Report Status 11/26/2020 FINAL  Final   Organism ID, Bacteria ESCHERICHIA COLI (A)  Final      Susceptibility   Escherichia  coli - MIC*    AMPICILLIN >=32 RESISTANT Resistant     CEFAZOLIN <=4 SENSITIVE Sensitive     CEFEPIME <=0.12 SENSITIVE Sensitive     CEFTRIAXONE <=0.25 SENSITIVE Sensitive     CIPROFLOXACIN <=0.25 SENSITIVE Sensitive     GENTAMICIN <=1  SENSITIVE Sensitive     IMIPENEM <=0.25 SENSITIVE Sensitive     NITROFURANTOIN <=16 SENSITIVE Sensitive     TRIMETH/SULFA <=20 SENSITIVE Sensitive     AMPICILLIN/SULBACTAM >=32 RESISTANT Resistant     PIP/TAZO <=4 SENSITIVE Sensitive     * 10,000 COLONIES/mL ESCHERICHIA COLI  Resp Panel by RT-PCR (Flu A&B, Covid) Nasopharyngeal Swab     Status: None   Collection Time: 11/24/20 12:47 AM   Specimen: Nasopharyngeal Swab; Nasopharyngeal(NP) swabs in vial transport medium  Result Value Ref Range Status   SARS Coronavirus 2 by RT PCR NEGATIVE NEGATIVE Final    Comment: (NOTE) SARS-CoV-2 target nucleic acids are NOT DETECTED.  The SARS-CoV-2 RNA is generally detectable in upper respiratory specimens during the acute phase of infection. The lowest concentration of SARS-CoV-2 viral copies this assay can detect is 138 copies/mL. A negative result does not preclude SARS-Cov-2 infection and should not be used as the sole basis for treatment or other patient management decisions. A negative result may occur with  improper specimen collection/handling, submission of specimen other than nasopharyngeal swab, presence of viral mutation(s) within the areas targeted by this assay, and inadequate number of viral copies(<138 copies/mL). A negative result must be combined with clinical observations, patient history, and epidemiological information. The expected result is Negative.  Fact Sheet for Patients:  EntrepreneurPulse.com.au  Fact Sheet for Healthcare Providers:  IncredibleEmployment.be  This test is no t yet approved or cleared by the [place] FDA and  has been authorized for detection and/or diagnosis of SARS-CoV-2 by FDA under an Emergency Use Authorization (EUA). This EUA will remain  in effect (meaning this test can be used) for the duration of the COVID-19 declaration under Section 564(b)(1) of the Act, 21 U.S.C.section 360bbb-3(b)(1), unless the  authorization is terminated  or revoked sooner.       Influenza A by PCR NEGATIVE NEGATIVE Final   Influenza B by PCR NEGATIVE NEGATIVE Final    Comment: (NOTE) The Xpert Xpress SARS-CoV-2/FLU/RSV plus assay is intended as an aid in the diagnosis of influenza from Nasopharyngeal swab specimens and should not be used as a sole basis for treatment. Nasal washings and aspirates are unacceptable for Xpert Xpress SARS-CoV-2/FLU/RSV testing.  Fact Sheet for Patients: EntrepreneurPulse.com.au  Fact Sheet for Healthcare Providers: IncredibleEmployment.be  This test is not yet approved or cleared by the [place] FDA and has been authorized for detection and/or diagnosis of SARS-CoV-2 by FDA under an Emergency Use Authorization (EUA). This EUA will remain in effect (meaning this test can be used) for the duration of the COVID-19 declaration under Section 564(b)(1) of the Act, 21 U.S.C. section 360bbb-3(b)(1), unless the authorization is terminated or revoked.  Performed at [facility], [address] [place], [place] [address]   Blood Culture (routine x 2)     Status: None (Preliminary result)   Collection Time: 11/24/20  1:08 AM   Specimen: BLOOD  Result Value Ref Range Status   Specimen Description BLOOD BLOOD LEFT HAND  Final   Special Requests   Final    BOTTLES DRAWN AEROBIC AND ANAEROBIC Blood Culture results may not be optimal due to an inadequate volume of blood received in culture bottles  Culture   Final    NO GROWTH 4 DAYS Performed at [facility], [address] [place], [place] [address]    Report Status PENDING  Incomplete    RADIOLOGY:  CT HEAD WO CONTRAST  Result Date: 11/27/2020 CLINICAL DATA:  Mental status change, unknown cause EXAM: CT HEAD WITHOUT CONTRAST TECHNIQUE: Contiguous axial images were obtained from the base of the skull through the vertex without intravenous contrast. COMPARISON:   10/26/2020 FINDINGS: Brain: There is no acute intracranial hemorrhage, mass effect, or edema. Gray-white differentiation is preserved. There is no extra-axial fluid collection. 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 Chronic small vessel infarct adjacent to the right frontal horn also again noted. Additional patchy areas of hypoattenuation in the supratentorial white matter are nonspecific but probably reflects similar chronic microvascular ischemic changes. Prominence of the ventricles and sulci reflects stable parenchymal volume loss. Vascular: There is atherosclerotic calcification at the skull base. Skull: Calvarium is unremarkable. Sinuses/Orbits: Mild mucosal thickening. Bilateral lens replacements. Other: None. IMPRESSION: No acute intracranial abnormality. No change since recent prior study with stable chronic/nonemergent findings detailed above. Electronically Signed   By: Macy Mis M.D.   On: 11/27/2020 12:59     CODE STATUS:     Code Status Orders  (From admission, onward)           Start     Ordered   11/24/20 0757  Do not attempt resuscitation (DNR)  Continuous       Question Answer Comment  In the event of cardiac or respiratory ARREST Do not call a "code blue"   In the event of cardiac or respiratory ARREST Do not perform Intubation, CPR, defibrillation or ACLS   In the event of cardiac or respiratory ARREST Use medication by any route, position, wound care, and other measures to relive pain and suffering. May use oxygen, suction and manual treatment of airway obstruction as needed for comfort.   Comments nurse may pronounce      11/24/20 0756           Code Status History     Date Active Date Inactive Code Status Order ID Comments User Context   11/24/2020 0256 11/24/2020 0756 Full Code [id number]  Athena Masse, MD ED   12/07/2018 1332 12/10/2018 1841 Full Code [id number]  Lang Snow, NP ED        TOTAL TIME TAKING CARE OF THIS  PATIENT: 40 minutes.    Fritzi Mandes M.D  [facility]    CC: Primary care physician; Albina Billet, MD

## 2020-11-28 NOTE — TOC Transition Note (Addendum)
Transition of Care [facility]) - CM/SW Discharge Note   Patient Details  Name: Robert Conrad MRN: [id number] Date of Birth: [birth year]  [facility] CM/SW Contact:  Eileen Stanford, LCSW Phone Number: 11/28/2020, 12:58 PM   Clinical Narrative:   [facility] arranged. 3n1 and walker ordered through [employer] and was delivered on Monday.    Final next level of care: Home w Home Health Services Barriers to Discharge: No Barriers Identified   Patient Goals and CMS Choice   CMS Medicare.gov Compare Post Acute Care list provided to:: Patient Represenative (must comment) (son Fritz Pickerel) Choice offered to / list presented to : Adult Children  Discharge Placement                    Patient and family notified of of transfer: 11/28/20  Discharge Plan and Services                DME Arranged: 3-N-1Gilford Rile DME Agency: [employer] Date DME Agency Contacted: 11/28/20 Time DME Agency Contacted: [phone] Representative spoke with at DME Agency: Suanne Marker HH Arranged: PT, OT, RN, Nurse's Aide [place] Agency: [facility] Date [place]: 11/28/20 Time [place]: 1257 Representative spoke with at [place]: [place] ([place]) Interventions     Readmission Risk Interventions No flowsheet data found.

## 2020-11-29 LAB — CULTURE, BLOOD (ROUTINE X 2)
Culture: NO GROWTH
Culture: NO GROWTH

## 2021-02-25 ENCOUNTER — Encounter: Payer: Self-pay | Admitting: *Deleted

## 2021-04-01 ENCOUNTER — Other Ambulatory Visit: Payer: Self-pay

## 2021-04-01 ENCOUNTER — Ambulatory Visit: Payer: Medicare HMO | Admitting: Urology

## 2021-04-01 ENCOUNTER — Encounter: Payer: Self-pay | Admitting: Urology

## 2021-04-01 VITALS — BP 138/72 | HR 70 | Ht 68.0 in | Wt 175.0 lb

## 2021-04-01 DIAGNOSIS — N401 Enlarged prostate with lower urinary tract symptoms: Secondary | ICD-10-CM | POA: Diagnosis not present

## 2021-04-01 DIAGNOSIS — N3941 Urge incontinence: Secondary | ICD-10-CM

## 2021-04-01 LAB — BLADDER SCAN AMB NON-IMAGING: Scan Result: 0

## 2021-04-01 NOTE — Progress Notes (Signed)
04/01/2021 1:48 PM   Robert Conrad 08/20/1940 [id number]  Referring provider: Anabel Bene, MD [facility] [address],  [place] [address]  Chief Complaint  Patient presents with   Urinary Incontinence    HPI: Robert Conrad is an 80 y.o. referred for evaluation of urinary frequency and incontinence.  He presents today with his wife and daughter  Followed by neurology [facility] for memory loss/confusion On tamsulosin for several years No previous urologic history He does have urinary frequency, urgency with occasional episodes of urge incontinence Wears pull-ups Family and patient states this is not bothersome Hospitalized 11/28/2020 for sepsis with diagnosis of E. coli UTI and pneumonia Followed by nephrology for stage IV chronic kidney disease Noncontrast CT abdomen pelvis 11/24/2020 showed prostate enlargement, 14 mm adrenal lesion.  No hydronephrosis, renal mass or urinary calculi   PMH: Past Medical History:  Diagnosis Date   Abdominal aneurysm without mention of rupture    Arthritis    Bowel trouble 1999   Diabetes mellitus without complication ([place]) 2683   non insulin dependent   Diverticulitis    Eye problems 2010   GERD (gastroesophageal reflux disease) 2009   Gout 2009   Hearing loss    Heart disease 1990   Hyperlipidemia 2009   Hypertension 2009   Obesity, unspecified    Personal history of colonic polyps    Personal history of tobacco use, presenting hazards to health    Renal insufficiency    Special screening for malignant neoplasms, colon    Stroke (cerebrum) ([place])    Stroke ([place]) 2009   Ulcer 2009   stomach    Surgical History: Past Surgical History:  Procedure Laterality Date   CARDIAC CATHETERIZATION  09-30-13   COLONOSCOPY  2009   COLONOSCOPY  03-07-11   Dr Jamal Collin   COLONOSCOPY  03/07/2011   COLONOSCOPY WITH PROPOFOL N/A 07/16/2016   Procedure: COLONOSCOPY WITH PROPOFOL;  Surgeon:  Christene Lye, MD;  Location: [facility] ENDOSCOPY;  Service: Endoscopy;  Laterality: N/A;   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   CORONARY ARTERY BYPASS GRAFT     EYE SURGERY  2009   HERNIA REPAIR  2010   ventral and umbilical    STOMACH SURGERY     sigmoid colon resection    Home Medications:  Allergies as of 04/01/2021       Reactions   Ciprofloxacin Hcl Rash        Medication List        Accurate as of April 01, 2021  1:48 PM. If you have any questions, ask your nurse or doctor.          amLODipine 5 MG tablet Commonly known as: NORVASC Take 5 mg by mouth daily.   aspirin 81 MG tablet Take 81 mg by mouth daily.   divalproex 500 MG DR tablet Commonly known as: DEPAKOTE Take 500 mg by mouth 2 (two) times daily.   metoprolol succinate 25 MG 24 hr tablet Commonly known as: TOPROL-XL Take 25 mg by mouth 2 (two) times daily.   multivitamin tablet Take 1 tablet by mouth daily.   omeprazole 20 MG capsule Commonly known as: PRILOSEC Take 20 mg by mouth daily.   QUEtiapine 25 MG tablet Commonly known as: SEROQUEL Take 0.5 tablets (12.5 mg total) by mouth at bedtime.   risperiDONE 0.25 MG tablet Commonly known as: RISPERDAL Take 0.25-0.5 mg by mouth See admin instructions. Take 1 tablet (0.25mg ) by  mouth every night for seven nights then take 2 tablets (0.5mg ) by mouth every night   simvastatin 20 MG tablet Commonly known as: ZOCOR Take 20 mg by mouth daily.   tamsulosin 0.4 MG Caps capsule Commonly known as: FLOMAX Take 0.4 mg by mouth daily.   torsemide 20 MG tablet Commonly known as: DEMADEX Take 20 mg by mouth daily.   Toujeo SoloStar 300 UNIT/ML Solostar Pen Generic drug: insulin glargine (1 Unit Dial) Inject 10 Units into the skin daily.        Allergies:  Allergies  Allergen Reactions   Ciprofloxacin Hcl Rash    Family History: Family History  Problem Relation Age of Onset   Cancer Mother    Heart attack Father      Social History:  reports that he has quit smoking. His smoking use included cigarettes. He has never used smokeless tobacco. He reports that he does not drink alcohol and does not use drugs.   Physical Exam: BP 138/72   Pulse 70   Ht 5\' 8"  (1.727 m)   Wt 175 lb (79.4 kg)   BMI 26.61 kg/m   Constitutional:  Alert and oriented, No acute distress. HEENT: [place] AT, moist mucus membranes.  Trachea midline, no masses. Cardiovascular: No clubbing, cyanosis, or edema. Respiratory: Normal respiratory effort, no increased work of breathing. Psychiatric: Normal mood and affect.  Laboratory Data:  Urinalysis Pending   Assessment & Plan:    1.  Urge incontinence We discussed possible etiologies including bladder overactivity secondary to BPH or neurogenic detrusor overactivity due to his cognitive impairment Would not recommend anticholinergic medication however he would be a candidate for a beta 3 agonist Bladder scan PVR was 0 mL Family declines treatment for his voiding symptoms/incontinence at this time as they state it is not bothersome May follow-up as needed  2.  BPH with LUTS Continue tamsulosin   Abbie Sons, MD  [place] [address], [place], [place] [address] [phone]

## 2021-04-02 LAB — URINALYSIS, COMPLETE
Bilirubin, UA: NEGATIVE
Glucose, UA: NEGATIVE
Ketones, UA: NEGATIVE
Leukocytes,UA: NEGATIVE
Nitrite, UA: NEGATIVE
Protein,UA: NEGATIVE
RBC, UA: NEGATIVE
Specific Gravity, UA: 1.02 (ref 1.005–1.030)
Urobilinogen, Ur: 0.2 mg/dL (ref 0.2–1.0)
pH, UA: 6 (ref 5.0–7.5)

## 2021-04-02 LAB — MICROSCOPIC EXAMINATION: Bacteria, UA: NONE SEEN

## 2021-06-24 ENCOUNTER — Ambulatory Visit
Admission: RE | Admit: 2021-06-24 | Discharge: 2021-06-24 | Disposition: A | Discharge status: Home / Self Care | Payer: Medicare HMO | Source: Home / Self Care | Attending: Internal Medicine | Admitting: Internal Medicine

## 2021-06-24 ENCOUNTER — Other Ambulatory Visit: Payer: Self-pay

## 2021-06-24 ENCOUNTER — Ambulatory Visit
Admission: RE | Admit: 2021-06-24 | Discharge: 2021-06-24 | Disposition: A | Discharge status: Home / Self Care | Payer: Medicare HMO | Source: Ambulatory Visit | Attending: Internal Medicine | Admitting: Internal Medicine

## 2021-06-24 ENCOUNTER — Other Ambulatory Visit: Payer: Self-pay | Admitting: Internal Medicine

## 2021-06-24 DIAGNOSIS — R059 Cough, unspecified: Secondary | ICD-10-CM | POA: Insufficient documentation

## 2021-06-24 DIAGNOSIS — E871 Hypo-osmolality and hyponatremia: Secondary | ICD-10-CM | POA: Diagnosis not present

## 2021-06-24 DIAGNOSIS — I130 Hypertensive heart and chronic kidney disease with heart failure and stage 1 through stage 4 chronic kidney disease, or unspecified chronic kidney disease: Secondary | ICD-10-CM | POA: Diagnosis not present

## 2021-06-25 ENCOUNTER — Emergency Department: Payer: Medicare HMO

## 2021-06-25 ENCOUNTER — Other Ambulatory Visit: Payer: Self-pay

## 2021-06-25 ENCOUNTER — Inpatient Hospital Stay
Admission: EM | Admit: 2021-06-25 | Discharge: 2021-07-04 | DRG: 291 | Disposition: A | Discharge status: Hospice | Payer: Medicare HMO | Attending: Student in an Organized Health Care Education/Training Program | Admitting: Student in an Organized Health Care Education/Training Program

## 2021-06-25 DIAGNOSIS — N170 Acute kidney failure with tubular necrosis: Secondary | ICD-10-CM | POA: Diagnosis not present

## 2021-06-25 DIAGNOSIS — Z66 Do not resuscitate: Secondary | ICD-10-CM | POA: Diagnosis present

## 2021-06-25 DIAGNOSIS — I509 Heart failure, unspecified: Secondary | ICD-10-CM

## 2021-06-25 DIAGNOSIS — E785 Hyperlipidemia, unspecified: Secondary | ICD-10-CM | POA: Diagnosis present

## 2021-06-25 DIAGNOSIS — N184 Chronic kidney disease, stage 4 (severe): Secondary | ICD-10-CM | POA: Diagnosis present

## 2021-06-25 DIAGNOSIS — Z8673 Personal history of transient ischemic attack (TIA), and cerebral infarction without residual deficits: Secondary | ICD-10-CM

## 2021-06-25 DIAGNOSIS — E861 Hypovolemia: Secondary | ICD-10-CM | POA: Diagnosis not present

## 2021-06-25 DIAGNOSIS — E669 Obesity, unspecified: Secondary | ICD-10-CM | POA: Diagnosis present

## 2021-06-25 DIAGNOSIS — I130 Hypertensive heart and chronic kidney disease with heart failure and stage 1 through stage 4 chronic kidney disease, or unspecified chronic kidney disease: Secondary | ICD-10-CM | POA: Diagnosis present

## 2021-06-25 DIAGNOSIS — G9341 Metabolic encephalopathy: Secondary | ICD-10-CM | POA: Diagnosis present

## 2021-06-25 DIAGNOSIS — N179 Acute kidney failure, unspecified: Secondary | ICD-10-CM | POA: Diagnosis not present

## 2021-06-25 DIAGNOSIS — I739 Peripheral vascular disease, unspecified: Secondary | ICD-10-CM | POA: Diagnosis present

## 2021-06-25 DIAGNOSIS — F0390 Unspecified dementia without behavioral disturbance: Secondary | ICD-10-CM | POA: Diagnosis present

## 2021-06-25 DIAGNOSIS — R0600 Dyspnea, unspecified: Secondary | ICD-10-CM

## 2021-06-25 DIAGNOSIS — J209 Acute bronchitis, unspecified: Secondary | ICD-10-CM | POA: Diagnosis present

## 2021-06-25 DIAGNOSIS — R0902 Hypoxemia: Secondary | ICD-10-CM

## 2021-06-25 DIAGNOSIS — E871 Hypo-osmolality and hyponatremia: Secondary | ICD-10-CM | POA: Diagnosis present

## 2021-06-25 DIAGNOSIS — R531 Weakness: Secondary | ICD-10-CM | POA: Diagnosis not present

## 2021-06-25 DIAGNOSIS — I2510 Atherosclerotic heart disease of native coronary artery without angina pectoris: Secondary | ICD-10-CM | POA: Diagnosis present

## 2021-06-25 DIAGNOSIS — R338 Other retention of urine: Secondary | ICD-10-CM | POA: Diagnosis present

## 2021-06-25 DIAGNOSIS — E119 Type 2 diabetes mellitus without complications: Secondary | ICD-10-CM

## 2021-06-25 DIAGNOSIS — Z20822 Contact with and (suspected) exposure to covid-19: Secondary | ICD-10-CM | POA: Diagnosis present

## 2021-06-25 DIAGNOSIS — I714 Abdominal aortic aneurysm, without rupture, unspecified: Secondary | ICD-10-CM | POA: Diagnosis present

## 2021-06-25 DIAGNOSIS — I5033 Acute on chronic diastolic (congestive) heart failure: Secondary | ICD-10-CM | POA: Diagnosis present

## 2021-06-25 DIAGNOSIS — Z87891 Personal history of nicotine dependence: Secondary | ICD-10-CM

## 2021-06-25 DIAGNOSIS — E875 Hyperkalemia: Secondary | ICD-10-CM | POA: Diagnosis present

## 2021-06-25 DIAGNOSIS — K219 Gastro-esophageal reflux disease without esophagitis: Secondary | ICD-10-CM | POA: Diagnosis present

## 2021-06-25 DIAGNOSIS — Z7189 Other specified counseling: Secondary | ICD-10-CM | POA: Diagnosis not present

## 2021-06-25 DIAGNOSIS — Z6828 Body mass index (BMI) 28.0-28.9, adult: Secondary | ICD-10-CM

## 2021-06-25 DIAGNOSIS — Z7982 Long term (current) use of aspirin: Secondary | ICD-10-CM

## 2021-06-25 DIAGNOSIS — Z8249 Family history of ischemic heart disease and other diseases of the circulatory system: Secondary | ICD-10-CM | POA: Diagnosis not present

## 2021-06-25 DIAGNOSIS — E1122 Type 2 diabetes mellitus with diabetic chronic kidney disease: Secondary | ICD-10-CM | POA: Diagnosis present

## 2021-06-25 DIAGNOSIS — N189 Chronic kidney disease, unspecified: Secondary | ICD-10-CM | POA: Diagnosis not present

## 2021-06-25 DIAGNOSIS — Z515 Encounter for palliative care: Secondary | ICD-10-CM

## 2021-06-25 DIAGNOSIS — E1151 Type 2 diabetes mellitus with diabetic peripheral angiopathy without gangrene: Secondary | ICD-10-CM | POA: Diagnosis present

## 2021-06-25 DIAGNOSIS — I10 Essential (primary) hypertension: Secondary | ICD-10-CM | POA: Diagnosis present

## 2021-06-25 DIAGNOSIS — Z8719 Personal history of other diseases of the digestive system: Secondary | ICD-10-CM | POA: Diagnosis not present

## 2021-06-25 DIAGNOSIS — Z888 Allergy status to other drugs, medicaments and biological substances status: Secondary | ICD-10-CM

## 2021-06-25 DIAGNOSIS — R7989 Other specified abnormal findings of blood chemistry: Secondary | ICD-10-CM

## 2021-06-25 DIAGNOSIS — I25810 Atherosclerosis of coronary artery bypass graft(s) without angina pectoris: Secondary | ICD-10-CM | POA: Diagnosis present

## 2021-06-25 DIAGNOSIS — Z794 Long term (current) use of insulin: Secondary | ICD-10-CM | POA: Diagnosis not present

## 2021-06-25 HISTORY — DX: Pneumonia, unspecified organism: J18.9

## 2021-06-25 HISTORY — DX: Hypotension, unspecified: I95.9

## 2021-06-25 LAB — CBC WITH DIFFERENTIAL/PLATELET
Abs Immature Granulocytes: 0.08 10*3/uL — ABNORMAL HIGH (ref 0.00–0.07)
Abs Immature Granulocytes: 0.09 10*3/uL — ABNORMAL HIGH (ref 0.00–0.07)
Basophils Absolute: 0 10*3/uL (ref 0.0–0.1)
Basophils Absolute: 0 10*3/uL (ref 0.0–0.1)
Basophils Relative: 0 %
Basophils Relative: 0 %
Eosinophils Absolute: 0.1 10*3/uL (ref 0.0–0.5)
Eosinophils Absolute: 0.1 10*3/uL (ref 0.0–0.5)
Eosinophils Relative: 1 %
Eosinophils Relative: 1 %
HCT: 39.7 % (ref 39.0–52.0)
HCT: 37.5 % — ABNORMAL LOW (ref 39.0–52.0)
Hemoglobin: 12.7 g/dL — ABNORMAL LOW (ref 13.0–17.0)
Hemoglobin: 12 g/dL — ABNORMAL LOW (ref 13.0–17.0)
Immature Granulocytes: 1 %
Immature Granulocytes: 1 %
Lymphocytes Relative: 11 %
Lymphocytes Relative: 14 %
Lymphs Abs: 1 10*3/uL (ref 0.7–4.0)
Lymphs Abs: 1.1 10*3/uL (ref 0.7–4.0)
MCH: 29 pg (ref 26.0–34.0)
MCH: 28.8 pg (ref 26.0–34.0)
MCHC: 32 g/dL (ref 30.0–36.0)
MCHC: 32 g/dL (ref 30.0–36.0)
MCV: 90.6 fL (ref 80.0–100.0)
MCV: 89.9 fL (ref 80.0–100.0)
Monocytes Absolute: 1.2 10*3/uL — ABNORMAL HIGH (ref 0.1–1.0)
Monocytes Absolute: 1.1 10*3/uL — ABNORMAL HIGH (ref 0.1–1.0)
Monocytes Relative: 13 %
Monocytes Relative: 14 %
Neutro Abs: 6.5 10*3/uL (ref 1.7–7.7)
Neutro Abs: 5.3 10*3/uL (ref 1.7–7.7)
Neutrophils Relative %: 74 %
Neutrophils Relative %: 70 %
Platelets: 143 10*3/uL — ABNORMAL LOW (ref 150–400)
Platelets: 153 10*3/uL (ref 150–400)
RBC: 4.38 MIL/uL (ref 4.22–5.81)
RBC: 4.17 MIL/uL — ABNORMAL LOW (ref 4.22–5.81)
RDW: 14.4 % (ref 11.5–15.5)
RDW: 14.3 % (ref 11.5–15.5)
WBC: 8.8 10*3/uL (ref 4.0–10.5)
WBC: 7.6 10*3/uL (ref 4.0–10.5)
nRBC: 0 % (ref 0.0–0.2)
nRBC: 0 % (ref 0.0–0.2)

## 2021-06-25 LAB — RESPIRATORY PANEL BY PCR

## 2021-06-25 LAB — OSMOLALITY: Osmolality: 276 mOsm/kg (ref 275–295)

## 2021-06-25 LAB — CBC
HCT: 39.1 % (ref 39.0–52.0)
Hemoglobin: 12.6 g/dL — ABNORMAL LOW (ref 13.0–17.0)
MCH: 29.2 pg (ref 26.0–34.0)
MCHC: 32.2 g/dL (ref 30.0–36.0)
MCV: 90.7 fL (ref 80.0–100.0)
Platelets: 143 10*3/uL — ABNORMAL LOW (ref 150–400)
RBC: 4.31 MIL/uL (ref 4.22–5.81)
RDW: 14.2 % (ref 11.5–15.5)
WBC: 8.7 10*3/uL (ref 4.0–10.5)
nRBC: 0 % (ref 0.0–0.2)

## 2021-06-25 LAB — BASIC METABOLIC PANEL
Anion gap: 10 (ref 5–15)
BUN: 37 mg/dL — ABNORMAL HIGH (ref 8–23)
CO2: 22 mmol/L (ref 22–32)
Calcium: 8.8 mg/dL — ABNORMAL LOW (ref 8.9–10.3)
Chloride: 95 mmol/L — ABNORMAL LOW (ref 98–111)
Creatinine, Ser: 2.42 mg/dL — ABNORMAL HIGH (ref 0.61–1.24)
GFR, Estimated: 26 mL/min — ABNORMAL LOW (ref >60)
Glucose, Bld: 135 mg/dL — ABNORMAL HIGH (ref 70–99)
Potassium: 5.2 mmol/L — ABNORMAL HIGH (ref 3.5–5.1)
Sodium: 127 mmol/L — ABNORMAL LOW (ref 135–145)

## 2021-06-25 LAB — VALPROIC ACID LEVEL: Valproic Acid Lvl: 28 ug/mL — ABNORMAL LOW (ref 50.0–100.0)

## 2021-06-25 LAB — URINALYSIS, ROUTINE W REFLEX MICROSCOPIC
Bacteria, UA: NONE SEEN
Bilirubin Urine: NEGATIVE
Glucose, UA: NEGATIVE mg/dL
Ketones, ur: NEGATIVE mg/dL
Leukocytes,Ua: NEGATIVE
Nitrite: NEGATIVE
Protein, ur: NEGATIVE mg/dL
Specific Gravity, Urine: 1.011 (ref 1.005–1.030)
Squamous Epithelial / HPF: NONE SEEN (ref 0–5)
pH: 5 (ref 5.0–8.0)

## 2021-06-25 LAB — SODIUM, URINE, RANDOM: Sodium, Ur: 40 mmol/L

## 2021-06-25 LAB — AMMONIA: Ammonia: 24 umol/L (ref 9–35)

## 2021-06-25 LAB — PROCALCITONIN: Procalcitonin: 0.16 ng/mL

## 2021-06-25 LAB — D-DIMER, QUANTITATIVE: D-Dimer, Quant: 1.77 ug/mL-FEU — ABNORMAL HIGH (ref 0.00–0.50)

## 2021-06-25 LAB — TSH: TSH: 1.873 u[IU]/mL (ref 0.350–4.500)

## 2021-06-25 LAB — TROPONIN I (HIGH SENSITIVITY)
Troponin I (High Sensitivity): 7 ng/L (ref <18)
Troponin I (High Sensitivity): 6 ng/L (ref <18)
Troponin I (High Sensitivity): 8 ng/L (ref <18)
Troponin I (High Sensitivity): 8 ng/L (ref <18)

## 2021-06-25 LAB — GLUCOSE, CAPILLARY: Glucose-Capillary: 130 mg/dL — ABNORMAL HIGH (ref 70–99)

## 2021-06-25 LAB — VITAMIN B12: Vitamin B-12: 202 pg/mL (ref 180–914)

## 2021-06-25 LAB — RESP PANEL BY RT-PCR (FLU A&B, COVID) ARPGX2
Influenza A by PCR: NEGATIVE
Influenza B by PCR: NEGATIVE
SARS Coronavirus 2 by RT PCR: NEGATIVE

## 2021-06-25 LAB — OSMOLALITY, URINE: Osmolality, Ur: 385 mOsm/kg (ref 300–900)

## 2021-06-25 LAB — BRAIN NATRIURETIC PEPTIDE: B Natriuretic Peptide: 538.9 pg/mL — ABNORMAL HIGH (ref 0.0–100.0)

## 2021-06-25 LAB — MAGNESIUM: Magnesium: 1.7 mg/dL (ref 1.7–2.4)

## 2021-06-25 MED ORDER — IPRATROPIUM-ALBUTEROL 0.5-2.5 (3) MG/3ML IN SOLN
3.0000 mL | RESPIRATORY_TRACT | Status: DC | PRN
Start: 2021-06-25 — End: 2021-06-30
  Administered 2021-06-29 (×2): 3 mL via RESPIRATORY_TRACT
  Filled 2021-06-25 (×2): qty 3

## 2021-06-25 MED ORDER — SENNOSIDES-DOCUSATE SODIUM 8.6-50 MG PO TABS
1.0000 | ORAL_TABLET | Freq: Every evening | ORAL | Status: DC | PRN
Start: 2021-06-25 — End: 2021-07-03

## 2021-06-25 MED ORDER — ONDANSETRON HCL 4 MG PO TABS: 4.0000 mg | ORAL_TABLET | Freq: Four times a day (QID) | ORAL | Status: DC | PRN

## 2021-06-25 MED ORDER — RISPERIDONE 0.25 MG PO TABS
0.5000 mg | ORAL_TABLET | Freq: Every day | ORAL | Status: DC
Start: 2021-06-25 — End: 2021-07-05
  Administered 2021-06-25 – 2021-07-02 (×8): 0.5 mg via ORAL
  Filled 2021-06-25 (×11): qty 2

## 2021-06-25 MED ORDER — SIMVASTATIN 10 MG PO TABS
20.0000 mg | ORAL_TABLET | Freq: Every day | ORAL | Status: DC
  Administered 2021-06-26 – 2021-07-03 (×8): 20 mg via ORAL
  Filled 2021-06-25 (×11): qty 2

## 2021-06-25 MED ORDER — PANTOPRAZOLE SODIUM 40 MG PO TBEC
40.0000 mg | DELAYED_RELEASE_TABLET | Freq: Every day | ORAL | Status: DC
  Administered 2021-06-26 – 2021-07-04 (×9): 40 mg via ORAL
  Filled 2021-06-25 (×9): qty 1

## 2021-06-25 MED ORDER — ACETAMINOPHEN 325 MG PO TABS
650.0000 mg | ORAL_TABLET | Freq: Four times a day (QID) | ORAL | Status: DC | PRN
Start: 2021-06-25 — End: 2021-07-05

## 2021-06-25 MED ORDER — METOPROLOL TARTRATE 5 MG/5ML IV SOLN
5.0000 mg | INTRAVENOUS | Status: DC | PRN
Start: 2021-06-25 — End: 2021-07-03

## 2021-06-25 MED ORDER — TAMSULOSIN HCL 0.4 MG PO CAPS
0.4000 mg | ORAL_CAPSULE | Freq: Every day | ORAL | Status: DC
  Administered 2021-06-26 – 2021-07-04 (×9): 0.4 mg via ORAL
  Filled 2021-06-25 (×9): qty 1

## 2021-06-25 MED ORDER — DIVALPROEX SODIUM 500 MG PO DR TAB
500.0000 mg | DELAYED_RELEASE_TABLET | Freq: Two times a day (BID) | ORAL | Status: DC
  Administered 2021-06-25 – 2021-07-04 (×17): 500 mg via ORAL
  Filled 2021-06-25 (×18): qty 1

## 2021-06-25 MED ORDER — HYDRALAZINE HCL 20 MG/ML IJ SOLN: 10.0000 mg | INTRAMUSCULAR | Status: DC | PRN

## 2021-06-25 MED ORDER — INSULIN ASPART 100 UNIT/ML IJ SOLN
0.0000 [IU] | Freq: Three times a day (TID) | INTRAMUSCULAR | Status: DC
  Administered 2021-06-26 – 2021-06-29 (×4): 1 [IU] via SUBCUTANEOUS
  Administered 2021-07-01: 2 [IU] via SUBCUTANEOUS
  Administered 2021-07-02: 17:00:00 1 [IU] via SUBCUTANEOUS
  Administered 2021-07-02: 09:00:00 2 [IU] via SUBCUTANEOUS
  Administered 2021-07-02: 1 [IU] via SUBCUTANEOUS
  Administered 2021-07-03 (×2): 2 [IU] via SUBCUTANEOUS
  Administered 2021-07-04 (×3): 1 [IU] via SUBCUTANEOUS
  Filled 2021-06-25 (×13): qty 1

## 2021-06-25 MED ORDER — GUAIFENESIN 100 MG/5ML PO LIQD
5.0000 mL | ORAL | Status: DC | PRN
Start: 2021-06-25 — End: 2021-07-05
  Administered 2021-06-27 – 2021-06-30 (×9): 5 mL via ORAL
  Filled 2021-06-25 (×9): qty 10

## 2021-06-25 MED ORDER — ASPIRIN EC 81 MG PO TBEC
81.0000 mg | DELAYED_RELEASE_TABLET | Freq: Every day | ORAL | Status: DC
  Administered 2021-06-26 – 2021-07-04 (×9): 81 mg via ORAL
  Filled 2021-06-25 (×20): qty 1

## 2021-06-25 MED ORDER — HEPARIN SODIUM (PORCINE) 5000 UNIT/ML IJ SOLN
5000.0000 [IU] | Freq: Three times a day (TID) | INTRAMUSCULAR | Status: DC
  Administered 2021-06-25 – 2021-07-03 (×26): 5000 [IU] via SUBCUTANEOUS
  Filled 2021-06-25 (×26): qty 1

## 2021-06-25 MED ORDER — ONDANSETRON HCL 4 MG/2ML IJ SOLN: 4.0000 mg | Freq: Four times a day (QID) | INTRAMUSCULAR | Status: DC | PRN

## 2021-06-25 MED ORDER — TRAZODONE HCL 50 MG PO TABS
50.0000 mg | ORAL_TABLET | Freq: Every evening | ORAL | Status: DC | PRN
  Administered 2021-06-25 – 2021-07-01 (×5): 50 mg via ORAL
  Filled 2021-06-25 (×5): qty 1

## 2021-06-25 MED ORDER — FUROSEMIDE 10 MG/ML IJ SOLN
20.0000 mg | Freq: Two times a day (BID) | INTRAMUSCULAR | Status: DC
  Administered 2021-06-25: 20 mg via INTRAVENOUS
  Filled 2021-06-25: qty 2

## 2021-06-25 MED ORDER — IPRATROPIUM-ALBUTEROL 0.5-2.5 (3) MG/3ML IN SOLN
3.0000 mL | Freq: Four times a day (QID) | RESPIRATORY_TRACT | Status: DC
  Administered 2021-06-25 – 2021-06-26 (×2): 3 mL via RESPIRATORY_TRACT
  Filled 2021-06-25 (×2): qty 3

## 2021-06-25 MED ORDER — METOPROLOL SUCCINATE ER 50 MG PO TB24
25.0000 mg | ORAL_TABLET | Freq: Two times a day (BID) | ORAL | Status: DC
  Administered 2021-06-25 – 2021-07-04 (×17): 25 mg via ORAL
  Filled 2021-06-25 (×19): qty 1

## 2021-06-25 MED ORDER — INSULIN GLARGINE-YFGN 100 UNIT/ML ~~LOC~~ SOLN
5.0000 [IU] | Freq: Every day | SUBCUTANEOUS | Status: DC
  Administered 2021-06-26 – 2021-07-04 (×9): 5 [IU] via SUBCUTANEOUS
  Filled 2021-06-25 (×10): qty 0.05

## 2021-06-25 MED ORDER — FUROSEMIDE 10 MG/ML IJ SOLN
40.0000 mg | Freq: Once | INTRAMUSCULAR | Status: AC
  Administered 2021-06-25: 40 mg via INTRAVENOUS
  Filled 2021-06-25: qty 4

## 2021-06-25 MED ORDER — AMLODIPINE BESYLATE 5 MG PO TABS
5.0000 mg | ORAL_TABLET | Freq: Every day | ORAL | Status: DC
  Administered 2021-06-26 – 2021-07-03 (×7): 5 mg via ORAL
  Filled 2021-06-25 (×8): qty 1

## 2021-06-25 NOTE — Assessment & Plan Note (Addendum)
Continue aspirin and statin. 

## 2021-06-25 NOTE — Assessment & Plan Note (Addendum)
Likely from underlying physical stressors.  No focal neurodeficit.  CT head is negative.  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  UA-negative.  No obvious signs of bacterial infection at this time.  B12 at lower normal limit of 202, ammonia and TSH within normal limit, valproic acid levels low at 28.  Mild persistent hyponatremia which can be contributory with underlying advanced dementia. -Continue to monitor

## 2021-06-25 NOTE — Assessment & Plan Note (Addendum)
Echocardiogram in 2020 showed normal EF.  Most likely diastolic dysfunction. Elevated BNP at 538.  Repeat echo ordered-pending. Significant improvement in lower extremity edema. -Continue IV Lasix at a lower dose of 20 mg IV daily -Daily BMP and weight -Strict intake and output

## 2021-06-25 NOTE — Assessment & Plan Note (Addendum)
Blood pressure within goal -Continue home Norvasc, metoprolol.  -Continue IV Lasix.  IV.

## 2021-06-25 NOTE — ED Triage Notes (Signed)
Pt come with c/o SOB, increased weakness and cough. Pt has congestion and junky cough present. Family reports weakness and pt just not feeling his self.

## 2021-06-25 NOTE — ED Notes (Signed)
Informed RN bed assigned 

## 2021-06-25 NOTE — Assessment & Plan Note (Addendum)
-  Sliding scale and Accu-Cheks.  CBG currently within goal - Semglee 5 units daily.  - Holding off on home regimen.

## 2021-06-25 NOTE — Assessment & Plan Note (Addendum)
Currently without any chest pain  -continue aspirin, beta-blocker and statin

## 2021-06-25 NOTE — Assessment & Plan Note (Addendum)
Hyponatremia labs with lower normal serum and urine osmolality and urinary sodium of 40, more consistent with hypervolemic hyponatremia. Slight improvement of sodium to 128. -Monitor sodium while he is being diuresed.

## 2021-06-25 NOTE — ED Provider Notes (Signed)
[facility] Provider Note    Event Date/Time   First MD Initiated Contact with Patient 06/25/21 [phone]     (approximate)   History   Shortness of Breath and Weakness   HPI  Robert Conrad is a 81 y.o. male who has been coughing for about a week.  Last couple days he has been not making much sense.  Yesterday he was very wobbly and had to be helped to the bathroom much more unsteady than usual.  He could not feed himself yesterday either and had to be fed and this morning as well.  This was because he was too shaky and could not handle a spoon.  He was also sitting at the table for well over an hour building something with a bunch of napkins.  He was saying things that did not make any sense this is per his wife and daughter.  Family reports he is not eating or drinking very much at all.  He has not drank anything or ate anything today.      Physical Exam   Triage Vital Signs: ED Triage Vitals  Enc Vitals Group     BP 06/25/21 1020 (!) 144/59     Pulse Rate 06/25/21 1020 65     Resp 06/25/21 1020 19     Temp 06/25/21 1020 98.5 F (36.9 C)     Temp Source 06/25/21 1020 Oral     SpO2 06/25/21 1020 95 %     Weight --      Height --      Head Circumference --      Peak Flow --      Pain Score 06/25/21 1023 0     Pain Loc --      Pain Edu? --      Excl. in [place]? --     Most recent vital signs: Vitals:   06/25/21 1500 06/25/21 1610  BP: 130/78 134/66  Pulse: 62 68  Resp: (!) 23 (!) 22  Temp:    SpO2: 91% 92%     General: Awake, no distress.  CV:  Good peripheral perfusion.  Heart regular rate and rhythm no audible murmurs Resp:  Normal effort.  Lungs occasional scattered wheezes Abd:  Slightly distended but not tender no organomegaly is palpable Extremities: He has edema bilaterally Neuro: Cranial nerves II through XII appear to be intact.  Patient is very deaf and is hard for him to understand.  He has normal motor strength in the arms.   Legs were not tested he has a very large intention tremor worse on the right than the left.  Family says this is worse than usually.  ED Results / Procedures / Treatments   Labs (all labs ordered are listed, but only abnormal results are displayed) Labs Reviewed  BASIC METABOLIC PANEL - Abnormal; Notable for the following components:      Result Value   Sodium 127 (*)    Potassium 5.2 (*)    Chloride 95 (*)    Glucose, Bld 135 (*)    BUN 37 (*)    Creatinine, Ser 2.42 (*)    Calcium 8.8 (*)    GFR, Estimated 26 (*)    All other components within normal limits  CBC - Abnormal; Notable for the following components:   Hemoglobin 12.6 (*)    Platelets 143 (*)    All other components within normal limits  URINALYSIS, ROUTINE W REFLEX MICROSCOPIC - Abnormal; Notable for  the following components:   Color, Urine YELLOW (*)    APPearance CLEAR (*)    Hgb urine dipstick SMALL (*)    All other components within normal limits  BRAIN NATRIURETIC PEPTIDE - Abnormal; Notable for the following components:   B Natriuretic Peptide 538.9 (*)    All other components within normal limits  CBC WITH DIFFERENTIAL/PLATELET - Abnormal; Notable for the following components:   Hemoglobin 12.7 (*)    Platelets 143 (*)    Monocytes Absolute 1.2 (*)    Abs Immature Granulocytes 0.08 (*)    All other components within normal limits  D-DIMER, QUANTITATIVE - Abnormal; Notable for the following components:   D-Dimer, Quant 1.77 (*)    All other components within normal limits  CBC WITH DIFFERENTIAL/PLATELET - Abnormal; Notable for the following components:   RBC 4.17 (*)    Hemoglobin 12.0 (*)    HCT 37.5 (*)    Monocytes Absolute 1.1 (*)    Abs Immature Granulocytes 0.09 (*)    All other components within normal limits  RESP PANEL BY RT-PCR (FLU A&B, COVID) [id number]  VALPROIC ACID LEVEL  CBG MONITORING, ED  TROPONIN I (HIGH SENSITIVITY)  TROPONIN I (HIGH SENSITIVITY)     EKG  EKG read interpreted  by me shows normal sinus rhythm rate of 62 normal axis right bundle branch block first-degree AV block no obvious acute ST-T wave changes.  Seems very similar to EKG from July last year.   RADIOLOGY  Chest x-ray read by radiology reviewed by me shows mild cardiomegaly.  Possible some CHF.  I looked at the pictures.  I agree.  It is possible her some CHF.  EKG chest x-ray is really unchanged from the last 2 chest x-rays. Head CT shows only an old stroke per radiology I reviewed the x-ray pictures. PROCEDURES:  Critical Care performed: Critical care time about half an hour.  This includes reviewing some of the old records looking at the old lab work and chest x-rays.  Additionally I spoke with his 2 family members and discussed his case with the hospitalist I reviewed all the test that have come back so far that I have obtained.  We are still waiting for the MRI valproic acid and course we cannot get a CT angio due to his creatinine being so high and his GFR being so low and the VQ scan we cannot get until tomorrow because nuclear medicine is closed for the day at 3:00.  Hospitalist feels there is enough going on to make [place] admit him 1 where the other which I agree.  His sodium is extremely low GFR is low he has edema in his legs chest x-ray and BNP are consistent with CHF.  We will have to be very careful not to get his kidneys more messed up and they are.  Procedures   MEDICATIONS ORDERED IN ED: Medications  furosemide (LASIX) injection 40 mg (has no administration in time range)     IMPRESSION / MDM / ASSESSMENT AND PLAN / ED COURSE  I reviewed the triage vital signs and the nursing notes. Patient with hyponatremia, acute kidney injury, dyspnea and weakness.  He is confused and has altered mental status apparently has congestive heart failure and is short of breath.  His D-dimer is positive.  He has multiple things going on which will need to be addressed inpatient.  He is not safe to go home  currently.  Especially with his wobbliness when he tries to  walk.  We will get the MRI to see if there is anything that happened in his cerebellum stroke wise.  We will begin working on the CHF and hyponatremia.       FINAL CLINICAL IMPRESSION(S) / ED DIAGNOSES   Final diagnoses:  Weakness  Dyspnea, unspecified type  Hyponatremia  AKI (acute kidney injury) ([place])  Positive D-dimer  Acute on chronic congestive heart failure, unspecified heart failure type ([place])     Rx / DC Orders   ED Discharge Orders     None        Note:  This document was prepared using Dragon voice recognition software and may include unintentional dictation errors.   Nena Polio, MD 06/25/21 [phone]

## 2021-06-25 NOTE — Assessment & Plan Note (Addendum)
Chest was clear today.  COVID-19, flu and respiratory viral panel negative. Procalcitonin at 0.16 which is inconclusive. No radiologic evidence of pneumonia. -Monitor procalcitonin -Supportive care

## 2021-06-25 NOTE — H&P (Signed)
History and Physical    Patient: Robert Conrad TIW:[id number] DOB: 1941/01/25 DOA: 06/25/2021 DOS: the patient was seen and examined on 06/25/2021 PCP: Albina Billet, MD  Patient coming from: Home  Chief Complaint:  Chief Complaint  Patient presents with   Shortness of Breath   Weakness    HPI: Robert Conrad is a 81 y.o. 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  Most of the history is provided by the caregiver, patient's wife and daughter at bedside. Per family patient has overall been doing well since his previous UTI in July 2022.  But about 1 week ago his daughter had URI thereafter he started developing symptoms of URI, cough and congestion.  But over the last 48 hours he started having progressive weakness, confusion and tendency of falling.  Denies any fevers, chills or other complaints.  Per family typically eats a good meal but over the past few days he has had poor appetite.  He is also not been taking his torsemide routinely.  In the ED today he had bilateral rhonchorous breath sounds, global confusion without focal neurodeficit and generalized weakness.  Lab work showed acute kidney injury with creatinine of 2.42, baseline 1.7, hyponatremia, mild hyperkalemia, elevated BNP, elevated D-dimer.  UA was negative.  Chest x-ray was suggestive of CHF.  CT head was negative.  Daughter and wife confirms he is DNR.  Review of Systems:  General = no fevers, chills, dizziness,  fatigue HEENT/EYES = negative for loss of vision, double vision, blurred vision,  sore throa Cardiovascular= negative for chest pain, palpitation Respiratory/lungs= negative for hemoptysis,  Gastrointestinal= negative for nausea, vomiting, abdominal pain Genitourinary= negative for Dysuria MSK = Negative for arthralgia, myalgias Neurology= Negative for headache, numbness, tingling   Psychiatry= Negative for suicidal and homocidal ideation Skin= Negative for Rash  Past Medical History:  Diagnosis Date   Abdominal aneurysm without mention of rupture    Arthritis    Bowel trouble 1999   Diabetes mellitus without complication ([place]) 2505   non insulin dependent   Diverticulitis    Eye problems 2010   GERD (gastroesophageal reflux disease) 2009   Gout 2009   Hearing loss    Heart disease 1990   Hyperlipidemia 2009   Hypertension 2009   Obesity, unspecified    Personal history of colonic polyps    Personal history of tobacco use, presenting hazards to health    Renal insufficiency    Special screening for malignant neoplasms, colon    Stroke (cerebrum) ([place])    Stroke ([place]) 2009   Ulcer 2009   stomach   Past Surgical History:  Procedure Laterality Date   CARDIAC CATHETERIZATION  09-30-13   COLONOSCOPY  2009   COLONOSCOPY  03-07-11   Dr Jamal Collin   COLONOSCOPY  03/07/2011   COLONOSCOPY WITH PROPOFOL N/A 07/16/2016   Procedure: COLONOSCOPY WITH PROPOFOL;  Surgeon: Christene Lye, MD;  Location: [facility] ENDOSCOPY;  Service: Endoscopy;  Laterality: N/A;   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   CORONARY ARTERY BYPASS GRAFT     EYE SURGERY  2009   HERNIA REPAIR  2010   ventral and umbilical    STOMACH SURGERY     sigmoid colon resection   Social History:  reports that he has quit smoking. His smoking use included cigarettes. He has never used smokeless tobacco. He reports that he  does not drink alcohol and does not use drugs.  Allergies  Allergen Reactions   Ciprofloxacin Hcl Rash    Family History  Problem Relation Age of Onset   Cancer Mother    Heart attack Father     Prior to Admission medications   Medication Sig Start Date End Date Taking? Authorizing Provider  amLODipine (NORVASC) 5 MG tablet Take 5 mg by mouth daily.    [provider]  aspirin 81 MG tablet Take 81 mg by mouth daily.    [provider]  divalproex  (DEPAKOTE) 500 MG DR tablet Take 500 mg by mouth 2 (two) times daily.    [provider]  metoprolol succinate (TOPROL-XL) 25 MG 24 hr tablet Take 25 mg by mouth 2 (two) times daily.  02/05/13   [provider]  Multiple Vitamin (MULTIVITAMIN) tablet Take 1 tablet by mouth daily.    [provider]  omeprazole (PRILOSEC) 20 MG capsule Take 20 mg by mouth daily.  02/02/13   [provider]  QUEtiapine (SEROQUEL) 25 MG tablet Take 0.5 tablets (12.5 mg total) by mouth at bedtime. 11/28/20 12/28/20  Fritzi Mandes, MD  risperiDONE (RISPERDAL) 0.25 MG tablet Take 0.25-0.5 mg by mouth See admin instructions. Take 1 tablet (0.25mg ) by mouth every night for seven nights then take 2 tablets (0.5mg ) by mouth every night 11/19/20   [provider]  simvastatin (ZOCOR) 20 MG tablet Take 20 mg by mouth daily.  02/16/13   [provider]  tamsulosin (FLOMAX) 0.4 MG CAPS capsule Take 0.4 mg by mouth daily. 09/25/20   [provider]  torsemide (DEMADEX) 20 MG tablet Take 20 mg by mouth daily. 11/05/20   [provider]  TOUJEO SOLOSTAR 300 UNIT/ML Solostar Pen Inject 10 Units into the skin daily. 11/28/20   Fritzi Mandes, MD    Physical Exam: Vitals:   06/25/21 1500 06/25/21 1610 06/25/21 1630 06/25/21 1700  BP: 130/78 134/66 128/68 (!) 146/66  Pulse: 62 68 70   Resp: (!) 23 (!) 22 (!) 21 (!) 21  Temp:      TempSrc:      SpO2: 91% 92% 93%    Constitutional: Not in acute distress Respiratory: Diffuse bilateral rhonchi Cardiovascular: Normal sinus rhythm, no rubs Abdomen: Nontender nondistended good bowel sounds Musculoskeletal: No edema noted Skin: No rashes seen Neurologic: Follows all the basic commands.  Alert to name.  No focal neurodeficits. Psychiatric: Alert to name.    Data Reviewed: EKG shows right bundle branch block Creatinine 2.42, baseline 1.7.  Sodium 127, potassium 5.2, BNP 538.  Assessment and Plan: * Acute metabolic  encephalopathy- (present on admission) Likely from underlying physical stressors.  No focal neurodeficit.  CT head is negative.  MRI brain ordered in the ED.  UA-negative.  No obvious signs of bacterial infection at this time.  Continue neurochecks.  Check ammonia, TSH, B12 and folate. He is arousable and answers basic questions just very hard of hearing.  No concerns for CO2 retention.  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  Getting IV Lasix.  Monitor renal function and urine output.  Baseline creatinine 1.7.  Admission creatinine 2.4.  Hyponatremia In setting of CHF likely from hypervolemia.  We will check urine sodium, awesome and serum awesome.  Closely monitor BMP.  Elevated brain natriuretic peptide (BNP) level Echocardiogram in 2020 showed normal EF.  We will repeat echocardiogram.  He does have  2+ bilateral lower extremity pitting edema, elevated BNP and chest x-ray suggestive of fluid overload.  Currently on Lasix.  Monitor lab and renal function.  Acute bronchitis Bilateral diffuse rhonchi.  Check respiratory panel 20 pathogen.  Flu/COVID-negative.  Bronchodilators scheduled and as needed, I-S/flutter valve.  Supplemental oxygen as needed.  Check procalcitonin.  Positive D dimer Nonspecific, low suspicion for PE.  Unable to get CTA due to his renal dysfunction.  If necessary we can obtain VQ scan.  CAD (coronary artery disease), autologous vein bypass graft- (present on admission) Currently without any chest pain continue his aspirin, beta-blocker and statin  Hyperlipidemia- (present on admission) Statin  Essential hypertension- (present on admission) Continue home Norvasc, metoprolol.  Getting IV Lasix.  IV.  Hydralazine and Lopressor ordered.  DM2 (diabetes mellitus, type 2) (HCC) Sliding scale and Accu-Cheks.  Semglee 5 units daily.  Holding off on home regimen.  PAD (peripheral artery  disease) ([place])- (present on admission) Continue aspirin and statin.   Advance Care Planning:   Code Status: DNR   Consults: None  Family Communication: Daughter and wife at bedside  Severity of Illness: The appropriate patient status for this patient is INPATIENT. Inpatient status is judged to be reasonable and necessary in order to provide the required intensity of service to ensure the patient's safety. The patient's presenting symptoms, physical exam findings, and initial radiographic and laboratory data in the context of their chronic comorbidities is felt to place them at high risk for further clinical deterioration. Furthermore, it is not anticipated that the patient will be medically stable for discharge from the hospital within 2 midnights of admission.   * I certify that at the point of admission it is my clinical judgment that the patient will require inpatient hospital care spanning beyond 2 midnights from the point of admission due to high intensity of service, high risk for further deterioration and high frequency of surveillance required.*  Author: Damita Lack, MD 06/25/2021 5:48 PM  For on call review [email or web address].

## 2021-06-25 NOTE — Assessment & Plan Note (Addendum)
Exam suggest likely secondary to fluid overload.  Getting IV Lasix.  Monitor renal function and urine output.  Baseline creatinine 1.7.  Admission creatinine 2.4. Creatinine remained stable despite having good diuresis with IV Lasix. -Monitor renal function -Avoid nephrotoxins

## 2021-06-25 NOTE — ED Notes (Signed)
First Nurse Note:  Pt to ED via [facility] from home for cough, shortness of breath, and altered mental status. Pt has hx/o dementia but is altered from baseline. Pt also has rales and edema in both LE. Per EMS pt has been tested for COVID multiple times and is COVID neg. Pt has 1.5 inch of ntg paste applied by ems. Pt BP 143 69. Pt is in NAD.

## 2021-06-25 NOTE — Assessment & Plan Note (Addendum)
Continue statin. 

## 2021-06-25 NOTE — Assessment & Plan Note (Addendum)
Nonspecific, low suspicion for PE.  Unable to get CTA due to his renal dysfunction.  Currently on room air. -Can do VQ scan if needed

## 2021-06-26 ENCOUNTER — Encounter: Payer: Self-pay | Admitting: Internal Medicine

## 2021-06-26 ENCOUNTER — Inpatient Hospital Stay
Admit: 2021-06-26 | Discharge: 2021-06-26 | Disposition: A | Discharge status: Home / Self Care | Payer: Medicare HMO | Attending: Internal Medicine | Admitting: Internal Medicine

## 2021-06-26 DIAGNOSIS — G9341 Metabolic encephalopathy: Secondary | ICD-10-CM | POA: Diagnosis not present

## 2021-06-26 LAB — CBC
HCT: 34.5 % — ABNORMAL LOW (ref 39.0–52.0)
Hemoglobin: 11.5 g/dL — ABNORMAL LOW (ref 13.0–17.0)
MCH: 29.1 pg (ref 26.0–34.0)
MCHC: 33.3 g/dL (ref 30.0–36.0)
MCV: 87.3 fL (ref 80.0–100.0)
Platelets: 142 10*3/uL — ABNORMAL LOW (ref 150–400)
RBC: 3.95 MIL/uL — ABNORMAL LOW (ref 4.22–5.81)
RDW: 14 % (ref 11.5–15.5)
WBC: 7.1 10*3/uL (ref 4.0–10.5)
nRBC: 0 % (ref 0.0–0.2)

## 2021-06-26 LAB — HEMOGLOBIN A1C
Hgb A1c MFr Bld: 6.6 % — ABNORMAL HIGH (ref 4.8–5.6)
Mean Plasma Glucose: 143 mg/dL

## 2021-06-26 LAB — COMPREHENSIVE METABOLIC PANEL
ALT: 12 U/L (ref 0–44)
AST: 14 U/L — ABNORMAL LOW (ref 15–41)
Albumin: 2.9 g/dL — ABNORMAL LOW (ref 3.5–5.0)
Alkaline Phosphatase: 30 U/L — ABNORMAL LOW (ref 38–126)
Anion gap: 10 (ref 5–15)
BUN: 41 mg/dL — ABNORMAL HIGH (ref 8–23)
CO2: 24 mmol/L (ref 22–32)
Calcium: 8.8 mg/dL — ABNORMAL LOW (ref 8.9–10.3)
Chloride: 94 mmol/L — ABNORMAL LOW (ref 98–111)
Creatinine, Ser: 2.45 mg/dL — ABNORMAL HIGH (ref 0.61–1.24)
GFR, Estimated: 26 mL/min — ABNORMAL LOW (ref >60)
Glucose, Bld: 111 mg/dL — ABNORMAL HIGH (ref 70–99)
Potassium: 4.3 mmol/L (ref 3.5–5.1)
Sodium: 128 mmol/L — ABNORMAL LOW (ref 135–145)
Total Bilirubin: 0.7 mg/dL (ref 0.3–1.2)
Total Protein: 6.2 g/dL — ABNORMAL LOW (ref 6.5–8.1)

## 2021-06-26 LAB — ECHOCARDIOGRAM COMPLETE
Area-P 1/2: 3.46 cm2
Calc EF: 68.6 %
Height: 68 in
S' Lateral: 2.9 cm
Single Plane A2C EF: 67.6 %
Single Plane A4C EF: 68.8 %
Weight: 3195.79 oz

## 2021-06-26 LAB — GLUCOSE, CAPILLARY
Glucose-Capillary: 95 mg/dL (ref 70–99)
Glucose-Capillary: 128 mg/dL — ABNORMAL HIGH (ref 70–99)
Glucose-Capillary: 136 mg/dL — ABNORMAL HIGH (ref 70–99)

## 2021-06-26 MED ORDER — FUROSEMIDE 10 MG/ML IJ SOLN
20.0000 mg | Freq: Every day | INTRAMUSCULAR | Status: DC
Start: 2021-06-27 — End: 2021-06-28
  Administered 2021-06-27 – 2021-06-28 (×2): 20 mg via INTRAVENOUS
  Filled 2021-06-26 (×2): qty 2

## 2021-06-26 MED ORDER — CHLORHEXIDINE GLUCONATE CLOTH 2 % EX PADS
6.0000 | MEDICATED_PAD | Freq: Every day | CUTANEOUS | Status: DC
  Administered 2021-06-26 – 2021-06-27 (×2): 6 via TOPICAL

## 2021-06-26 MED ORDER — HALOPERIDOL LACTATE 5 MG/ML IJ SOLN
5.0000 mg | Freq: Once | INTRAMUSCULAR | Status: AC
  Administered 2021-06-26: 03:00:00 5 mg via INTRAVENOUS
  Filled 2021-06-26: qty 1

## 2021-06-26 NOTE — Plan of Care (Signed)
Pt A&O x1-2 w/ frequent episodes of agitation/delirium. This has made education challenging. Will continue to attempt to re-orient pt and educate on plan of care as well as safety education.   Problem: Clinical Measurements: Goal: Ability to maintain clinical measurements within normal limits will improve Outcome: Progressing Goal: Will remain free from infection Outcome: Progressing Goal: Diagnostic test results will improve Outcome: Progressing Goal: Respiratory complications will improve Outcome: Progressing Goal: Cardiovascular complication will be avoided Outcome: Progressing   Problem: Activity: Goal: Risk for activity intolerance will decrease Outcome: Progressing   Problem: Nutrition: Goal: Adequate nutrition will be maintained Outcome: Progressing   Problem: Elimination: Goal: Will not experience complications related to bowel motility Outcome: Progressing Goal: Will not experience complications related to urinary retention Outcome: Progressing   Problem: Pain Managment: Goal: General experience of comfort will improve Outcome: Progressing   Problem: Skin Integrity: Goal: Risk for impaired skin integrity will decrease Outcome: Progressing   Problem: Education: Goal: Knowledge of General Education information will improve Description: Including pain rating scale, medication(s)/side effects and non-pharmacologic comfort measures Outcome: Not Progressing   Problem: Health Behavior/Discharge Planning: Goal: Ability to manage health-related needs will improve Outcome: Not Progressing   Problem: Coping: Goal: Level of anxiety will decrease Outcome: Not Progressing

## 2021-06-26 NOTE — TOC Initial Note (Signed)
Transition of Care [facility]) - Initial/Assessment Note    Patient Details  Name: Robert Conrad MRN: [id number] Date of Birth: [birth year]  [facility] of [facility]) CM/SW Contact:    Pete Pelt, RN Phone Number: 06/26/2021, 10:32 AM  Clinical Narrative:   RNCM spoke to daughter, as patient was sleeping.  Daughter states patient lives at home with his wife.  Family has PCA's private pay to assist both patient and spouse.    Daughter states with patient's last admission, they declined SNF.  Family would be willing to consider this time, as patient is much more weak than he was  during previous illnesses.  No recommendations at this time, RNCM provided TOC  contact information and informed family that [facility] would communicate recommendations for disposition when they become available.                Expected Discharge Plan:  (TBD) Barriers to Discharge: Continued Medical Work up   Patient Goals and CMS Choice     Choice offered to / list presented to : NA  Expected Discharge Plan and Services Expected Discharge Plan:  (TBD)   Discharge Planning Services: CM Consult   Living arrangements for the past 2 months: Single Family Home                                      Prior Living Arrangements/Services Living arrangements for the past 2 months: Single Family Home Lives with:: Self, Spouse (Family nearby and private pay home care aides) Patient language and need for interpreter reviewed:: Yes (Spoke to patient's daughter no interpreter required) Do you feel safe going back to the place where you live?: Yes      Need for Family Participation in Patient Care: Yes (Comment) Care giver support system in place?: Yes (comment)   Criminal Activity/Legal Involvement Pertinent to Current Situation/Hospitalization: No - Comment as needed  Activities of Daily Living Home Assistive Devices/Equipment: CBG Meter, Raised toilet seat with rails, Hearing aid ADL Screening (condition  at time of admission) Patient's cognitive ability adequate to safely complete daily activities?: Yes Is the patient deaf or have difficulty hearing?: Yes Does the patient have difficulty seeing, even when wearing glasses/contacts?: No Does the patient have difficulty concentrating, remembering, or making decisions?: Yes Patient able to express need for assistance with ADLs?: Yes Does the patient have difficulty dressing or bathing?: Yes Independently performs ADLs?: No Communication: Independent Dressing (OT): Appropriate for developmental age Grooming: Needs assistance Is this a change from baseline?: Pre-admission baseline Feeding: Independent Bathing: Needs assistance Is this a change from baseline?: Pre-admission baseline Toileting: Needs assistance Is this a change from baseline?: Pre-admission baseline In/Out Bed: Independent with device (comment) Walks in Home: Independent with device (comment) Does the patient have difficulty walking or climbing stairs?: Yes Weakness of Legs: Both Weakness of Arms/Hands: None  Permission Sought/Granted Permission sought to share information with : Case Manager Permission granted to share information with : Yes, Verbal Permission Granted     Permission granted to share info w AGENCY: Potential SNf or home health if warranted        Emotional Assessment Appearance:: Appears stated age Attitude/Demeanor/Rapport:  (Patient sleeping)     Alcohol / Substance Use: Illicit Drugs Psych Involvement: No (comment)  Admission diagnosis:  Hyponatremia [E87.1] Weakness [R53.1] Positive D-dimer [R79.89] AKI (acute kidney injury) ([place]) [X90.2] Acute metabolic encephalopathy [I09.73] Dyspnea, unspecified type [R06.00]  Acute on chronic congestive heart failure, unspecified heart failure type [facility]) [I50.9] Patient Active Problem List   Diagnosis Date Noted   Acute bronchitis 06/25/2021   Elevated brain natriuretic peptide (BNP) level 06/25/2021    Hyponatremia 06/25/2021   Positive D dimer 06/25/2021   Pneumonia of both lower lobes due to infectious organism    Benign prostatic hyperplasia without lower urinary tract symptoms    Hyperkalemia 11/24/2020   UTI (urinary tract infection) 11/02/1599   Acute metabolic encephalopathy 09/32/3557   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[place]) 11/24/2020   Sepsis ([place]) 11/24/2020   Severe sepsis ([place]) 11/24/2020   Hypotension    Acute delirium    Altered mental status 12/07/2018   PAD (peripheral artery disease) ([place]) 12/17/2016   DM2 (diabetes mellitus, type 2) ([place]) 12/17/2016   Essential hypertension 12/17/2016   Hyperlipidemia 12/17/2016   Renal artery stenosis ([place]) 10/05/2013   CAD (coronary artery disease), 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/27/2013   History of colon polyps 02/24/2013   Abdominal aortic aneurysm 02/24/2013   PCP:  Albina Billet, MD Pharmacy:   [facility] [address], [place] [address] Phone: [phone] Fax: [phone]  [facility] [place], [place] [address] Phone: [phone] Fax: [place], [place] - [address] [place] [address] Phone: [phone] Fax: [phone]  [id number] [place], [address] [place] [address] Phone: [phone] Fax: [phone]     Social Determinants of Health (SDOH) Interventions    Readmission Risk Interventions No flowsheet data found.

## 2021-06-26 NOTE — Progress Notes (Signed)
Pt experiencing episode of agitation, pulling at foley cath, EKG leads, and PIV wrap. A&Ox1. Attempting to get OOB as well. This RN as well as pt's daughter are unable to re-direct pt. This is exacerbated by pt being very HOH. Mitts applied to pt for safety but are also increasing pt agitation. Daughter was inquiring about re-starting pt's home dose of Seroquel. Neomia Glass, NP notified of situation. Informed TRN that Seroquel is being held d/t possible anticholinergic effects which could be further contributing to pt confusion. 1x dose haldol ordered. Explained rationale to daughter as to why Seroquel is being held, daughter verbalized understanding.   Update 0425: Haldol having therapeutic effect, pt resting comfortably. Pt not currently attempting to remove equipment. Will continue to monitor.

## 2021-06-26 NOTE — Evaluation (Signed)
Occupational Therapy Evaluation Patient Details Name: Robert Conrad MRN: [id number] DOB: June 17, 1940 Today's Date: 06/26/2021   History of Present Illness Pt is an 81 y/o M with PMH: DM, HTN, PAD, AAA, and renal atery stenosis who presents to ED via EMS d/t SOB and AMS. CT of head and UA were both negative. W/u revealed hyponatrmia and AKI with elevated creatinine. CXR suggestive of CHF and pt appears to be fluid overloaded per attending's note.   Clinical Impression   Pt seen for OT evaluation this date in setting of acute hospitalization d/t AMS. PT/OT overlap for safety with ADL transfers d/t current confusion and weakness. He presents somewhat clearer mentally today per his daughter. She states at baseline, he uses no AD for fxl mobility and has some help from [facility] aides PRN for bathing/dressing. He is still only oriented to self on assessment, but able to follow most basic commands with increased time and tactile cues (Pt is HOH). He requires MAX A to come to EOB sitting with HOB slightly elevated (with cues, he makes good effort to work B LE towards EOB, but ultimately requires MAX A for trunk elevation). He demos P sitting balance requiring MIN A for most of the session to sustain static sitting balance. OT engages pt in seated UB bathing with MIN A and he requires MAX A for LB (MOD A+2 with RW and knee blocking to CTS, while PT helps pt sustain static standing balance. OT assists with peri care which pt essentially requires MAX A for as his standing balance is too poor to alternate hands from UE support on walker).      Recommendations for follow up therapy are one component of a multi-disciplinary discharge planning process, led by the attending physician.  Recommendations may be updated based on patient status, additional functional criteria and insurance authorization.   Follow Up Recommendations  Skilled nursing-short term rehab (<3 hours/day)    Assistance Recommended at Discharge  Frequent or constant Supervision/Assistance  Patient can return home with the following Two people to help with walking and/or transfers;A lot of help with bathing/dressing/bathroom    Functional Status Assessment  Patient has had a recent decline in their functional status and demonstrates the ability to make significant improvements in function in a reasonable and predictable amount of time.  Equipment Recommendations  None recommended by OT    Recommendations for Other Services       Precautions / Restrictions Precautions Precautions: Fall Restrictions Weight Bearing Restrictions: No      Mobility Bed Mobility Overal bed mobility: Needs Assistance Bed Mobility: Supine to Sit     Supine to sit: Max assist          Transfers Overall transfer level: Needs assistance Equipment used: Rolling walker (2 wheels) Transfers: Sit to/from Stand Sit to Stand: Mod assist, +2 physical assistance           General transfer comment: with b/l foot/knee blocking and using tactile cues d/t severely HOH      Balance Overall balance assessment: Needs assistance   Sitting balance-Leahy Scale: Poor Sitting balance - Comments: requires at least MIN A for majority of session to sustain static sitting.     Standing balance-Leahy Scale: Zero Standing balance comment: 2p assist                           ADL either performed or assessed with clinical judgement   ADL  General ADL Comments: Requires MIN A for seated UB ADLs including dressing and requires MAX A for LB ADLs including standing posterior peri care (requries 2p-one to assist with standing balnace while second person completes task as his standing blance is too poor to alterante UE support from hand rests of 2WW), Requires MOD A +2 with b/l knee/foot blocking and use of RW for STS as well as manual assist of 2p for weight shifting to attempt to take steps.  Limited somewhat d/t weakness, but seemingly very limited by cogntion impacting pt's ability to sequence basic tasks and functions.     Vision   Additional Comments: unable to formally assess, seemingly tracks appropriately when providing visual attn, but requires multiple modes of stimuli to sustain attention.     Perception     Praxis      Pertinent Vitals/Pain Pain Assessment Pain Assessment: PAINAD Breathing: normal Negative Vocalization: occasional moan/groan, low speech, negative/disapproving quality Facial Expression: smiling or inexpressive Body Language: tense, distressed pacing, fidgeting Consolability: no need to console PAINAD Score: 2 Pain Location: grimmacing some with slight mobilization-appearing to guard abdomen Pain Descriptors / Indicators: Grimacing, Guarding Pain Intervention(s): Limited activity within patient's tolerance, Monitored during session     Hand Dominance     Extremity/Trunk Assessment Upper Extremity Assessment Upper Extremity Assessment: Difficult to assess due to impaired cognition (ROM seemingly appropriate, but pt with difficulty allowing for formal MMT assessment d/t confusion w/ difficulty sequencing)   Lower Extremity Assessment Lower Extremity Assessment: Difficult to assess due to impaired cognition       Communication     Cognition Arousal/Alertness: Awake/alert Behavior During Therapy: WFL for tasks assessed/performed Overall Cognitive Status: Difficult to assess                                 General Comments: Pt's daughter present in the room, she describes him as normally being fairly mentally clear, but experiencing sun-downing. States that he'd been doing better since his doctor added Seroquel which was helping with pt's sleep-wake cycles. That said, he is not typically oriented time time seemingly, although his cognitive PLOF is somewhat difficult to follow from family.     General Comments        Exercises Other Exercises Other Exercises: OT ed with pt family re: d/c recs   Shoulder Instructions      Home Living Family/patient expects to be discharged to:: Private residence Living Arrangements: Spouse/significant other Available Help at Discharge: Available 24 hours/day;Personal care attendant (aides PRN) Type of Home: House Home Access: Stairs to enter [employer] of Steps: 6 steps through the front Entrance Stairs-Rails: Right;Left;Can reach both Home Layout: One level     Bathroom Shower/Tub: Teacher, early years/pre: Standard     Home Equipment: Conservation officer, nature (2 wheels);BSC/3in1   Additional Comments: got these pieces of equipment after last hospital stay in July 2022      Prior Functioning/Environment Prior Level of Function : Needs assist  Cognitive Assist : Mobility (cognitive);ADLs (cognitive) Mobility (Cognitive): Intermittent cues ADLs (Cognitive): Intermittent cues Physical Assist : ADLs (physical)   ADLs (physical): Bathing;Dressing;IADLs Mobility Comments: was able to walk w/ no AD as recently as two weeks ago, but has gotten progressively weaker and more confused in that amount of time. ADLs Comments: assist from aides since last hospital stay PRN, for bathing/dressing, family provides IADLs-mostly py's spouse        OT  Problem List: Decreased strength;Decreased activity tolerance;Impaired balance (sitting and/or standing);Decreased cognition;Decreased safety awareness;Decreased knowledge of use of DME or AE      OT Treatment/Interventions: Self-care/ADL training;Therapeutic exercise;Therapeutic activities;DME and/or AE instruction;Cognitive remediation/compensation    OT Goals(Current goals can be found in the care plan section) Acute Rehab OT Goals Patient Stated Goal: to get him stronger so he can eventually come home OT Goal Formulation: With family Time For Goal Achievement: 07/10/21 Potential to Achieve Goals:  Fair ADL Goals Pt Will Perform Lower Body Dressing: with min guard assist;with min assist Pt Will Transfer to Toilet: with min guard assist;with min assist Pt Will Perform Toileting - Clothing Manipulation and hygiene: with min guard assist;with min assist  OT Frequency: Min 2X/week    Co-evaluation PT/OT/SLP Co-Evaluation/Treatment: Yes Reason for Co-Treatment: Complexity of the patient's impairments (multi-system involvement);For patient/therapist safety PT goals addressed during session: Mobility/safety with mobility OT goals addressed during session: ADL's and self-care      AM-PAC OT "6 Clicks" Daily Activity     Outcome Measure Help from another person eating meals?: None Help from another person taking care of personal grooming?: A Little Help from another person toileting, which includes using toliet, bedpan, or urinal?: A Lot Help from another person bathing (including washing, rinsing, drying)?: A Lot Help from another person to put on and taking off regular upper body clothing?: A Little Help from another person to put on and taking off regular lower body clothing?: A Lot 6 Click Score: 16   End of Session Equipment Utilized During Treatment: Gait belt;Rolling walker (2 wheels) Nurse Communication: Mobility status  Activity Tolerance: Patient tolerated treatment well Patient left: Other (comment) (seated EOB finishing up with PT)  OT Visit Diagnosis: Unsteadiness on feet (R26.81);Muscle weakness (generalized) (M62.81);Other symptoms and signs involving cognitive function                Time: 5638-7564 OT Time Calculation (min): 33 min Charges:  OT General Charges $OT Visit: 1 Visit OT Evaluation $OT Eval Moderate Complexity: 1 Mod OT Treatments $Self Care/Home Management : 8-22 mins  Gerrianne Scale, MS, OTR/L ascom [phone] 06/26/21, 2:31 PM

## 2021-06-26 NOTE — Progress Notes (Signed)
Progress Note   Patient: Robert Conrad VVO:[id number] DOB: Oct 17, 1940 DOA: 06/25/2021     1 DOS: the patient was seen and examined on 06/26/2021   Brief hospital course: Taken from H&P.  Robert Conrad is a 81 y.o. 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   Per family patient has overall been doing well since his previous UTI in July 2022.  But about 1 week ago his daughter had URI thereafter he started developing symptoms of URI, cough and congestion.  But over the last 48 hours he started having progressive weakness, confusion and tendency of falling.  Denies any fevers, chills or other complaints.  Per family typically eats a good meal but over the past few days he has had poor appetite.  He is also not been taking his torsemide routinely.  In ED he appears confused, without any focal neurodeficit.  Labs pertinent for AKI, creatinine of 2.42, baseline around 1.7, hyponatremia which is consistent with hypervolemic hyponatremia with borderline low osmolality of urine and serum, urinary sodium of 40, BNP at 538, mild hyperkalemia which has been resolved.  UA was negative.  Chest x-ray suggestive of pulmonary vascular congestion.  CT head was negative. COVID-19 and flu PCR negative.  Respiratory viral panel negative.  Procalcitonin at 0.16 which is inconclusive.  Ammonia, TSH and B12 within normal limits.  Folate pending.  Concern of CHF, echocardiogram pending.  He was started on IV Lasix as appears volume overload with pulmonary vascular congestion and lower extremity edema.   Assessment and Plan: * Acute metabolic encephalopathy- (present on admission) Likely from underlying physical stressors.  No focal neurodeficit.  CT head is negative.  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   UA-negative.  No obvious signs of bacterial infection at this time.  B12 at lower normal limit of 202, ammonia and TSH within normal limit, valproic acid levels low at 28.  Mild persistent hyponatremia which can be contributory with underlying advanced dementia. -Continue to monitor   Acute bronchitis Chest was clear today.  COVID-19, flu and respiratory viral panel negative. Procalcitonin at 0.16 which is inconclusive. No radiologic evidence of pneumonia. 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  Getting IV Lasix.  Monitor renal function and urine output.  Baseline creatinine 1.7.  Admission creatinine 2.4. Creatinine remained stable despite having good diuresis with IV Lasix. -Monitor renal function -Avoid nephrotoxins  Elevated brain natriuretic peptide (BNP) level Echocardiogram in 2020 showed normal EF.  Most likely diastolic dysfunction. Elevated BNP at 538.  Repeat echo ordered-pending. Significant improvement in lower extremity edema. -Continue IV Lasix at a lower dose of 20 mg IV daily -Daily BMP and weight -Strict intake and output   Hyponatremia Hyponatremia labs with lower normal serum and urine osmolality and urinary sodium of 40, more consistent with hypervolemic hyponatremia. Slight improvement of sodium to 128. -Monitor sodium while he is being diuresed.  CAD (coronary artery disease), autologous vein bypass graft- (present on admission) Currently without any chest pain  -continue aspirin, beta-blocker and statin  DM2 (diabetes mellitus, type 2) ([name]) -Sliding scale and Accu-Cheks.  CBG currently within goal - Semglee 5 units daily.  - Holding off on home regimen.  Essential hypertension- (present on  admission) Blood pressure within goal -Continue home Norvasc, metoprolol.  -Continue IV Lasix.  IV.    Positive D dimer Nonspecific, low  suspicion for PE.  Unable to get CTA due to his renal dysfunction.  Currently on room air. -Can do VQ scan if needed  PAD (peripheral artery disease) ([name])- (present on admission) -Continue aspirin and statin.  Hyperlipidemia- (present on admission) -Continue statin  Subjective: Patient was resting comfortably when seen today.  Easily arousable, very hard of hearing.  Following some commands.  Physical Exam: Vitals:   06/26/21 0414 06/26/21 0500 06/26/21 0805 06/26/21 1150  BP: 130/62  (!) 160/65 (!) 141/95  Pulse: 79  78 74  Resp: 18  18 20   Temp: 97.8 F (36.6 C)  98.6 F (37 C) 98.7 F (37.1 C)  TempSrc:    Oral  SpO2: 92%  94% 93%  Weight:  90.6 kg    Height:       General.  Lethargic elderly man, in no acute distress. Pulmonary.  Lungs clear bilaterally, normal respiratory effort. CV.  Regular rate and rhythm, no JVD, rub or murmur. Abdomen.  Soft, nontender, nondistended, BS positive. CNS.  Somnolent but arousable, oriented to self only, hard of hearing, following some commands. Extremities.  Trace LE edema, no cyanosis, pulses intact and symmetrical. Psychiatry.  Judgment and insight appears impaired.  Data Reviewed: I reviewed prior labs, imaging and notes.  Family Communication: Called wife with no response  Disposition: Status is: Inpatient Remains inpatient appropriate because: Severity of illness   Planned Discharge Destination: Skilled nursing facility  DVT prophylaxis.  Subcu heparin  Time spent: 50 minutes  This record has been created using Systems analyst. Errors have been sought and corrected,but may not always be located. Such creation errors do not reflect on the standard of care.  Author: Lorella Nimrod, MD 06/26/2021 3:24 PM  For on call review [email or web address].

## 2021-06-26 NOTE — Progress Notes (Signed)
Per pt daughter pt resperidone was not working last night when administered pt. Pt was hard ro ri-direct and was very impulsive. Pt resting at this time. NP Foust made aware of daughters concerns. Will contineu to monitor.

## 2021-06-26 NOTE — Hospital Course (Addendum)
Taken from H&P.  Robert Conrad is a 81 y.o. 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   Per family patient has overall been doing well since his previous UTI in July 2022.  But about 1 week ago his daughter had URI thereafter he started developing symptoms of URI, cough and congestion.  But over the last 48 hours he started having progressive weakness, confusion and tendency of falling.  Denies any fevers, chills or other complaints.  Per family typically eats a good meal but over the past few days he has had poor appetite.  He is also not been taking his torsemide routinely.  In ED he appears confused, without any focal neurodeficit.  Labs pertinent for AKI, creatinine of 2.42, baseline around 1.7, hyponatremia which is consistent with hypervolemic hyponatremia with borderline low osmolality of urine and serum, urinary sodium of 40, BNP at 538, mild hyperkalemia which has been resolved.  UA was negative.  Chest x-ray suggestive of pulmonary vascular congestion.  CT head was negative. COVID-19 and flu PCR negative.  Respiratory viral panel negative.  Procalcitonin at 0.16 which is inconclusive.  Ammonia, TSH and B12 within normal limits.  Folate pending.  Concern of CHF, echocardiogram pending.  He was started on IV Lasix as appears volume overload with pulmonary vascular congestion and lower extremity edema.  2/24: IV Lasix was discontinued today for concern of increasing creatinine.  Clinically appears euvolemic.  Remains lethargic with some persistent upper respiratory symptoms.  On room air. Family will consider rehab and outpatient palliative care.  2/25: Continue to have some cough, otherwise stable.  [facility].  2/26: Patient seems little more lethargic when seen today.  Per daughter and wife at bedside patient needs 100%  assistance with feeding.  Appetite remained poor.  Currently on 2 L of oxygen Worsening hyponatremia and renal function with mild hyperkalemia. Nephrology was consulted to assist with hyponatremia and worsening renal function. Lokelma was ordered for hyperkalemia. Nephrology ordered renal ultrasound. Repeat chest x-ray with worsening of pulmonary vascular congestion.  2/27: Mild worsening of renal function and hyperkalemia at 5.7.  Patient received Lokelma with no improvement.  Switched with [employer].  Nephrology also hold torsemide and started him on low-dose Lasix 20 mg IV daily. Some improvement in p.o. intake this morning per caregiver.  2/28: Patient with increased somnolence.  Otherwise seems comfortable.  Worsening renal function, hyponatremia and hyperkalemia.  CT head ordered-pending, ABG ordered. Nephrology is recommending tolvaptan today, continue with Veltassa and holding Lasix. Patient's insurance authorization will expire today but unable to discharge due to ongoing medical management and worsening.  Family is not ready for hospice yet.  Patient is high risk for deterioration and death.  Palliative care is on board.

## 2021-06-26 NOTE — Plan of Care (Signed)
°  Problem: Clinical Measurements: Goal: Respiratory complications will improve Outcome: Progressing   Problem: Clinical Measurements: Goal: Cardiovascular complication will be avoided Outcome: Progressing   Problem: Pain Managment: Goal: General experience of comfort will improve Outcome: Progressing   Problem: Safety: Goal: Ability to remain free from injury will improve Outcome: Progressing   Problem: Clinical Measurements: Goal: Will remain free from infection Outcome: Progressing

## 2021-06-26 NOTE — Evaluation (Signed)
Physical Therapy Evaluation Patient Details Name: Robert Conrad MRN: [id number] DOB: 01/18/1941 Today's Date: 06/26/2021  History of Present Illness  Pt is an 81 y/o M with PMH: DM, HTN, PAD, AAA, and renal atery stenosis who presents to ED via EMS d/t SOB and AMS. CT of head and UA were both negative. W/u revealed hyponatrmia and AKI with elevated creatinine. CXR suggestive of CHF and pt appears to be fluid overloaded per attending's note.  Clinical Impression  Pt is a pleasant 81 year old male who was admitted for acute metabolic encephalopathy. Joined in on OT evaluation and then finished PT evaluation once OT exited room. Received with pt seated taking a bath with OT. Pt performs bed mobility with total assist returning back to bed, transfers with mod assist +2 using RW, and ambulation with max assist +2 for taking side steps up towards HOB. Pt follows commands inconsistently. Pt demonstrates deficits with strength/mobility/cognition. Per family, pt was ambulating indep less than 2 weeks ago. Would benefit from skilled PT to address above deficits and promote optimal return to PLOF; recommend transition to STR upon discharge from acute hospitalization.      Recommendations for follow up therapy are one component of a multi-disciplinary discharge planning process, led by the attending physician.  Recommendations may be updated based on patient status, additional functional criteria and insurance authorization.  Follow Up Recommendations Skilled nursing-short term rehab (<3 hours/day)    Assistance Recommended at Discharge Frequent or constant Supervision/Assistance  Patient can return home with the following  Two people to help with walking and/or transfers;Two people to help with bathing/dressing/bathroom    Equipment Recommendations  (TBD)  Recommendations for Other Services       Functional Status Assessment Patient has had a recent decline in their functional status and  demonstrates the ability to make significant improvements in function in a reasonable and predictable amount of time.     Precautions / Restrictions Precautions Precautions: Fall Restrictions Weight Bearing Restrictions: No      Mobility  Bed Mobility Overal bed mobility: Needs Assistance Bed Mobility: Sit to Supine       Sit to supine: Total assist   General bed mobility comments: doesn't follow commands consistently. Needed total assist for return back to bed and +2 for sliding up and repositioning.    Transfers Overall transfer level: Needs assistance Equipment used: Rolling walker (2 wheels) Transfers: Sit to/from Stand Sit to Stand: Mod assist, +2 physical assistance           General transfer comment: with b/l foot/knee blocking and using tactile cues d/t severely HOH    Ambulation/Gait Ambulation/Gait assistance: Max assist, +2 physical assistance, +2 safety/equipment Gait Distance (Feet): 2 Feet Assistive device: Rolling walker (2 wheels) Gait Pattern/deviations: Step-to pattern       General Gait Details: needs manual facilitation and heavy cues for weight shifting and taking steps over towards HOB. Stands in crouched position  Stairs            Wheelchair Mobility    Modified Rankin (Stroke Patients Only)       Balance Overall balance assessment: Needs assistance Sitting-balance support: Bilateral upper extremity supported, Feet supported Sitting balance-Leahy Scale: Poor Sitting balance - Comments: requires at least MIN A for majority of session to sustain static sitting.     Standing balance-Leahy Scale: Zero Standing balance comment: 2p assist  Pertinent Vitals/Pain Pain Assessment Pain Assessment: PAINAD    Home Living Family/patient expects to be discharged to:: Private residence Living Arrangements: Spouse/significant other Available Help at Discharge: Available 24 hours/day;Personal  care attendant (aides PRN) Type of Home: House Home Access: Stairs to enter Entrance Stairs-Rails: Right;Left;Can reach both Entrance Stairs-Number of Steps: 6 steps through the front   Home Layout: One level Home Equipment: Rolling Walker (2 wheels);BSC/3in1 Additional Comments: got these pieces of equipment after last hospital stay in July 2022    Prior Function Prior Level of Function : Needs assist  Cognitive Assist : Mobility (cognitive);ADLs (cognitive) Mobility (Cognitive): Intermittent cues ADLs (Cognitive): Intermittent cues Physical Assist : ADLs (physical)   ADLs (physical): Bathing;Dressing;IADLs Mobility Comments: was able to walk w/ no AD as recently as two weeks ago, but has gotten progressively weaker and more confused in that amount of time. ADLs Comments: assist from aides since last hospital stay PRN, for bathing/dressing, family provides IADLs-mostly py's spouse     Hand Dominance        Extremity/Trunk Assessment   Upper Extremity Assessment Upper Extremity Assessment: Defer to OT evaluation    Lower Extremity Assessment Lower Extremity Assessment: Generalized weakness;Difficult to assess due to impaired cognition (B LE grossly 3/5)       Communication   Communication: HOH  Cognition Arousal/Alertness: Awake/alert Behavior During Therapy: WFL for tasks assessed/performed Overall Cognitive Status: Difficult to assess                                 General Comments: Pt's daughter present in the room, she describes him as normally being fairly mentally clear, but experiencing sun-downing. States that he'd been doing better since his doctor added Seroquel which was helping with pt's sleep-wake cycles. That said, he is not typically oriented time time seemingly, although his cognitive PLOF is somewhat difficult to follow from family.        General Comments      Exercises Other Exercises Other Exercises: supine/seated ther-ex  including LAQ, SLRs, and heel slides. Needs heavy cues for participation and pt fatigues shows disinterest after 3-4 reps. ranging needing min-max assist for completion.   Assessment/Plan    PT Assessment Patient needs continued PT services  PT Problem List Decreased strength;Decreased balance;Decreased mobility;Decreased safety awareness;Decreased cognition       PT Treatment Interventions Gait training;Therapeutic exercise;Therapeutic activities;Balance training    PT Goals (Current goals can be found in the Care Plan section)  Acute Rehab PT Goals Patient Stated Goal: to get stronger PT Goal Formulation: With family Time For Goal Achievement: 07/10/21 Potential to Achieve Goals: Fair    Frequency Min 2X/week     Co-evaluation PT/OT/SLP Co-Evaluation/Treatment: Yes Reason for Co-Treatment: Complexity of the patient's impairments (multi-system involvement);For patient/therapist safety;To address functional/ADL transfers;Necessary to address cognition/behavior during functional activity PT goals addressed during session: Mobility/safety with mobility;Balance OT goals addressed during session: ADL's and self-care       AM-PAC PT "6 Clicks" Mobility  Outcome Measure Help needed turning from your back to your side while in a flat bed without using bedrails?: A Lot Help needed moving from lying on your back to sitting on the side of a flat bed without using bedrails?: A Lot Help needed moving to and from a bed to a chair (including a wheelchair)?: A Lot Help needed standing up from a chair using your arms (e.g., wheelchair or bedside chair)?: A Lot Help  needed to walk in hospital room?: Total Help needed climbing 3-5 steps with a railing? : Total 6 Click Score: 10    End of Session Equipment Utilized During Treatment: Gait belt Activity Tolerance: Patient tolerated treatment well Patient left: in bed;with bed alarm set;with family/visitor present Nurse Communication: Mobility  status PT Visit Diagnosis: Muscle weakness (generalized) (M62.81);Difficulty in walking, not elsewhere classified (R26.2);History of falling (Z91.81)    Time: 3014-9969 PT Time Calculation (min) (ACUTE ONLY): 20 min   Charges:   PT Evaluation $PT Eval Moderate Complexity: 1 Mod PT Treatments $Therapeutic Exercise: 8-22 mins        Greggory Stallion, PT, DPT, GCS [phone]   Damon Baisch 06/26/2021, 2:39 PM

## 2021-06-27 ENCOUNTER — Encounter: Payer: Self-pay | Admitting: Internal Medicine

## 2021-06-27 DIAGNOSIS — R338 Other retention of urine: Secondary | ICD-10-CM | POA: Diagnosis present

## 2021-06-27 DIAGNOSIS — I5033 Acute on chronic diastolic (congestive) heart failure: Secondary | ICD-10-CM

## 2021-06-27 DIAGNOSIS — G9341 Metabolic encephalopathy: Secondary | ICD-10-CM | POA: Diagnosis not present

## 2021-06-27 LAB — GLUCOSE, CAPILLARY
Glucose-Capillary: 102 mg/dL — ABNORMAL HIGH (ref 70–99)
Glucose-Capillary: 131 mg/dL — ABNORMAL HIGH (ref 70–99)
Glucose-Capillary: 137 mg/dL — ABNORMAL HIGH (ref 70–99)
Glucose-Capillary: 94 mg/dL (ref 70–99)

## 2021-06-27 LAB — COMPREHENSIVE METABOLIC PANEL
ALT: 14 U/L (ref 0–44)
AST: 18 U/L (ref 15–41)
Albumin: 3 g/dL — ABNORMAL LOW (ref 3.5–5.0)
Alkaline Phosphatase: 32 U/L — ABNORMAL LOW (ref 38–126)
Anion gap: 10 (ref 5–15)
BUN: 40 mg/dL — ABNORMAL HIGH (ref 8–23)
CO2: 25 mmol/L (ref 22–32)
Calcium: 8.9 mg/dL (ref 8.9–10.3)
Chloride: 96 mmol/L — ABNORMAL LOW (ref 98–111)
Creatinine, Ser: 2.24 mg/dL — ABNORMAL HIGH (ref 0.61–1.24)
GFR, Estimated: 29 mL/min — ABNORMAL LOW (ref >60)
Glucose, Bld: 97 mg/dL (ref 70–99)
Potassium: 4.4 mmol/L (ref 3.5–5.1)
Sodium: 131 mmol/L — ABNORMAL LOW (ref 135–145)
Total Bilirubin: 0.8 mg/dL (ref 0.3–1.2)
Total Protein: 6.4 g/dL — ABNORMAL LOW (ref 6.5–8.1)

## 2021-06-27 LAB — FOLATE RBC
Folate, Hemolysate: 389 ng/mL
Folate, RBC: 987 ng/mL (ref >498)
Hematocrit: 39.4 % (ref 37.5–51.0)

## 2021-06-27 LAB — CBC
HCT: 35.8 % — ABNORMAL LOW (ref 39.0–52.0)
Hemoglobin: 11.8 g/dL — ABNORMAL LOW (ref 13.0–17.0)
MCH: 29.1 pg (ref 26.0–34.0)
MCHC: 33 g/dL (ref 30.0–36.0)
MCV: 88.2 fL (ref 80.0–100.0)
Platelets: 161 10*3/uL (ref 150–400)
RBC: 4.06 MIL/uL — ABNORMAL LOW (ref 4.22–5.81)
RDW: 14.3 % (ref 11.5–15.5)
WBC: 7.8 10*3/uL (ref 4.0–10.5)
nRBC: 0 % (ref 0.0–0.2)

## 2021-06-27 LAB — PROCALCITONIN: Procalcitonin: 0.22 ng/mL

## 2021-06-27 MED ORDER — AMOXICILLIN-POT CLAVULANATE 500-125 MG PO TABS
1.0000 | ORAL_TABLET | Freq: Two times a day (BID) | ORAL | Status: AC
  Administered 2021-06-27 – 2021-07-01 (×10): 500 mg via ORAL
  Filled 2021-06-27 (×10): qty 1

## 2021-06-27 MED ORDER — AZITHROMYCIN 500 MG PO TABS
250.0000 mg | ORAL_TABLET | Freq: Every day | ORAL | Status: AC
  Administered 2021-06-28 – 2021-07-01 (×4): 250 mg via ORAL
  Filled 2021-06-27 (×4): qty 1

## 2021-06-27 MED ORDER — AZITHROMYCIN 500 MG PO TABS
500.0000 mg | ORAL_TABLET | Freq: Every day | ORAL | Status: AC
  Administered 2021-06-27: 09:00:00 500 mg via ORAL
  Filled 2021-06-27: qty 1

## 2021-06-27 NOTE — Assessment & Plan Note (Signed)
BNP was elevated on admission.  Patient with history of CAD.  Echocardiogram with normal EF but grade 1 diastolic dysfunction.  Initially appears volume up and responding well to IV diuresis.  Patient has positive history for exertional dyspnea and orthopnea.  Was sleeping on recliner for some time, unable to lay flat due to breathing difficulties.  All consistent with diastolic CHF. -Continue with IV Lasix 20 mg daily. -Daily BMP and weight -Strict intake and output

## 2021-06-27 NOTE — TOC Progression Note (Signed)
Transition of Care [facility]) - Progression Note    Patient Details  Name: Robert Conrad MRN: [id number] Date of Birth: [birth year]  Transition of Care [facility]) CM/SW [place], RN Phone Number: 06/27/2021, 4:19 PM  Clinical Narrative:   Family agrees to sNF, bed search sent.  PASRR applied for, clinical sent.    Expected Discharge Plan:  (TBD) Barriers to Discharge: Continued Medical Work up  Expected Discharge Plan and Services Expected Discharge Plan:  (TBD)   Discharge Planning Services: CM Consult   Living arrangements for the past 2 months: Single Family Home                                       Social Determinants of Health (SDOH) Interventions    Readmission Risk Interventions No flowsheet data found.

## 2021-06-27 NOTE — Progress Notes (Signed)
Progress Note   Patient: Robert Conrad OEV:[id number] DOB: 1940-08-15 DOA: 06/25/2021     2 DOS: the patient was seen and examined on 06/27/2021   Brief hospital course: Taken from H&P.  Robert Conrad is a 81 y.o. 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   Per family patient has overall been doing well since his previous UTI in July 2022.  But about 1 week ago his daughter had URI thereafter he started developing symptoms of URI, cough and congestion.  But over the last 48 hours he started having progressive weakness, confusion and tendency of falling.  Denies any fevers, chills or other complaints.  Per family typically eats a good meal but over the past few days he has had poor appetite.  He is also not been taking his torsemide routinely.  In ED he appears confused, without any focal neurodeficit.  Labs pertinent for AKI, creatinine of 2.42, baseline around 1.7, hyponatremia which is consistent with hypervolemic hyponatremia with borderline low osmolality of urine and serum, urinary sodium of 40, BNP at 538, mild hyperkalemia which has been resolved.  UA was negative.  Chest x-ray suggestive of pulmonary vascular congestion.  CT head was negative. COVID-19 and flu PCR negative.  Respiratory viral panel negative.  Procalcitonin at 0.16 which is inconclusive.  Ammonia, TSH and B12 within normal limits.  Folate pending.  Concern of CHF, echocardiogram pending.  He was started on IV Lasix as appears volume overload with pulmonary vascular congestion and lower extremity edema.  PT is recommending SNF.  Assessment and Plan: * Acute metabolic encephalopathy- (present on admission) Likely from underlying physical stressors.  No focal neurodeficit.  CT head is negative.  MRI brain was negative for any acute infarcts, positive for chronic right MCA territory infarcts and  cerebral atrophy.  UA-negative.  No obvious signs of bacterial infection at this time.  B12 at lower normal limit of 202, ammonia and TSH within normal limit, valproic acid levels low at 28.  Mild persistent hyponatremia which can be contributory with underlying advanced dementia. -Continue to monitor   Acute bronchitis Chest was clear today.  COVID-19, flu and respiratory viral panel negative. Procalcitonin with slight worsening, at 0.22 today. No radiologic evidence of pneumonia. -Start him on Zithromax and Augmentin for concern of any superadded bacterial infection with bronchitis. 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  Getting IV Lasix.  Monitor renal function and urine output.  Baseline creatinine 1.7.  Admission creatinine 2.4>>2.24 Creatinine with slight improvement.  Good urinary output. -Monitor renal function -Avoid nephrotoxins  Acute on chronic diastolic CHF (congestive heart failure) (HCC) BNP was elevated on admission.  Patient with history of CAD.  Echocardiogram with normal EF but grade 1 diastolic dysfunction.  Initially appears volume up and responding well to IV diuresis.  Patient has positive history for exertional dyspnea and orthopnea.  Was sleeping on recliner for some time, unable to lay flat due to breathing difficulties.  All consistent with diastolic CHF. -Continue with IV Lasix 20 mg daily. -Daily BMP and weight -Strict intake and output  Acute urinary retention Apparently Foley was placed in ED for concern of urinary retention. Per wife he was voiding fine before.  She wants to keep catheter for comfort reasons as he does not have to get  up that frequently. I explained to her that goal can be achieved with condom catheter. -Remove Foley and give him a voiding trial.  If still retaining and unable to void then we will replace Foley and patient will follow-up  with urology as an outpatient  Hyponatremia Hyponatremia labs with lower normal serum and urine osmolality and urinary sodium of 40, more consistent with hypervolemic hyponatremia. Continue to improve with diuresis, at 131 today -Monitor sodium while he is being diuresed.  CAD (coronary artery disease), autologous vein bypass graft- (present on admission) Currently without any chest pain  -continue aspirin, beta-blocker and statin  DM2 (diabetes mellitus, type 2) ([place]) -Sliding scale and Accu-Cheks.  CBG currently within goal - Semglee 5 units daily.  - Holding off on home regimen.  Essential hypertension- (present on admission) Blood pressure within goal -Continue home Norvasc, metoprolol.  -Continue IV Lasix.  IV.    Positive D dimer Nonspecific, low suspicion for PE.  Unable to get CTA due to his renal dysfunction.  Currently on room air. -Can do VQ scan if needed  PAD (peripheral artery disease) ([place])- (present on admission) -Continue aspirin and statin.  Hyperlipidemia- (present on admission) -Continue statin Subjective: Patient was seen and examined today.  He was resting comfortably.  Continue to have some cough.  Per wife he was sleeping in recliner for some time due to difficulty breathing whenever he lays down in bed.  He was becoming progressively more weak and requiring more assistance.  Physical Exam: Vitals:   06/27/21 0523 06/27/21 0735 06/27/21 0906 06/27/21 1208  BP: (!) 168/54 (!) 151/65  (!) 130/59  Pulse: 69 72 75 71  Resp: 16 (!) 25  20  Temp: 99.1 F (37.3 C) 98.3 F (36.8 C)    TempSrc:      SpO2: 94%  93% 91%  Weight:      Height:       General.  Well-developed elderly man, in no acute distress. Pulmonary.  Lungs clear bilaterally, normal respiratory effort. CV.  Regular rate and rhythm, no JVD, rub or murmur. Abdomen.  Soft, nontender, nondistended, BS positive. CNS.  Alert and oriented .  No focal neurologic deficit. Extremities.  No edema,  no cyanosis, pulses intact and symmetrical. Psychiatry.  Appears to have mild cognitive impairment  Data Reviewed: I reviewed prior notes and labs.  Family Communication: Talked with wife and caregiver at bedside.  Later talked with daughter on phone as she had some questions regarding SNF.  Disposition: Status is: Inpatient Remains inpatient appropriate because: Severity of illness   Planned Discharge Destination: Skilled nursing facility  DVT prophylaxis.  Subcu heparin  Time spent: 50 minutes  This record has been created using Systems analyst. Errors have been sought and corrected,but may not always be located. Such creation errors do not reflect on the standard of care.  Author: Lorella Nimrod, MD 06/27/2021 2:24 PM  For on call review [email or web address].

## 2021-06-27 NOTE — Assessment & Plan Note (Signed)
Apparently Foley was placed in ED for concern of urinary retention. Per wife he was voiding fine before.  She wants to keep catheter for comfort reasons as he does not have to get up that frequently. I explained to her that goal can be achieved with condom catheter. -Remove Foley and give him a voiding trial.  If still retaining and unable to void then we will replace Foley and patient will follow-up with urology as an outpatient

## 2021-06-27 NOTE — Plan of Care (Signed)

## 2021-06-27 NOTE — NC FL2 (Signed)
[place] LEVEL OF CARE SCREENING TOOL     IDENTIFICATION  Patient Name: Robert Conrad Birthdate: May 27, 1940 Sex: male Admission Date (Current Location): 06/25/2021  [employer] and [place] Number:  Engineering geologist and Address:  [facility], [address] [place], [place] [address]      Provider Number: [id number]  Attending Physician Name and Address:  Lorella Nimrod, MD  Relative Name and Phone Number:  Velda Shell Daughter     [phone]    Current Level of Care: Hospital Recommended Level of Care: [place] Prior Approval Number:    Date Approved/Denied:   PASRR Number: PASRR in progress, clinicals sent 27 Jun 2021  Discharge Plan: SNF    Current Diagnoses: Patient Active Problem List   Diagnosis Date Noted   Acute on chronic diastolic CHF (congestive heart failure) ([place]) 06/27/2021   Acute urinary retention 06/27/2021   Acute bronchitis 06/25/2021   Hyponatremia 06/25/2021   Positive D dimer 06/25/2021   Benign prostatic hyperplasia without lower urinary tract symptoms    Hyperkalemia 11/24/2020   UTI (urinary tract infection) 02/18/5101   Acute metabolic encephalopathy 58/52/7782   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[place]) 11/24/2020   Acute delirium    Altered mental status 12/07/2018   PAD (peripheral artery disease) ([place]) 12/17/2016   DM2 (diabetes mellitus, type 2) ([place]) 12/17/2016   Essential hypertension 12/17/2016   Hyperlipidemia 12/17/2016   Renal artery stenosis ([place]) 10/05/2013   CAD (coronary artery disease), 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/27/2013   History of colon polyps 02/24/2013   Abdominal aortic aneurysm 02/24/2013    Orientation RESPIRATION BLADDER Height & Weight     Self  Normal External catheter Weight: 87.2 kg Height:  5\' 8"  (172.7 cm)  BEHAVIORAL SYMPTOMS/MOOD NEUROLOGICAL BOWEL NUTRITION STATUS      Continent Diet (Heart Healthy carb modified)  AMBULATORY  STATUS COMMUNICATION OF NEEDS Skin   Extensive Assist (Moderate with 2 people) Verbally Normal                       Personal Care Assistance Level of Assistance  Bathing, Feeding, Dressing Bathing Assistance: Maximum assistance Feeding assistance: Maximum assistance Dressing Assistance: Maximum assistance     Functional Limitations Info  Sight, Hearing, Speech Sight Info: Impaired Hearing Info: Impaired (hearing aide)      SPECIAL CARE FACTORS FREQUENCY  PT (By licensed PT), OT (By licensed OT)     PT Frequency: Min 5x weekly OT Frequency: Min 5x weekly            Contractures Contractures Info: Not present    Additional Factors Info  Code Status, Allergies Code Status Info: DNR Allergies Info: Ciprofloxacin           Current Medications (06/27/2021):  This is the current hospital active medication list Current Facility-Administered Medications  Medication Dose Route Frequency Provider Last Rate Last Admin   acetaminophen (TYLENOL) tablet 650 mg  650 mg Oral Q6H PRN Amin, Ankit Chirag, MD       amLODipine (NORVASC) tablet 5 mg  5 mg Oral Daily Amin, Ankit Chirag, MD   5 mg at 06/27/21 0850   amoxicillin-clavulanate (AUGMENTIN) 500-125 MG per tablet 500 mg  1 tablet Oral Q12H Lorella Nimrod, MD   500 mg at 06/27/21 1044   aspirin EC tablet 81 mg  81 mg Oral Daily Amin, Ankit Chirag, MD   81 mg at 06/27/21 0851   [START ON  06/28/2021] azithromycin (ZITHROMAX) tablet 250 mg  250 mg Oral Daily Lorella Nimrod, MD       Chlorhexidine Gluconate Cloth 2 % PADS 6 each  6 each Topical Daily Lorella Nimrod, MD   6 each at 06/27/21 1045   divalproex (DEPAKOTE) DR tablet 500 mg  500 mg Oral BID Damita Lack, MD   500 mg at 06/27/21 0850   furosemide (LASIX) injection 20 mg  20 mg Intravenous Daily Lorella Nimrod, MD   20 mg at 06/27/21 0848   guaiFENesin (ROBITUSSIN) 100 MG/5ML liquid 5 mL  5 mL Oral Q4H PRN Damita Lack, MD   5 mL at 06/27/21 0911   heparin  injection 5,000 Units  5,000 Units Subcutaneous Q8H Amin, Ankit Chirag, MD   5,000 Units at 06/27/21 1403   hydrALAZINE (APRESOLINE) injection 10 mg  10 mg Intravenous Q4H PRN Amin, Ankit Chirag, MD       insulin aspart (novoLOG) injection 0-9 Units  0-9 Units Subcutaneous TID WC Amin, Ankit Chirag, MD   1 Units at 06/27/21 1404   insulin glargine-yfgn (SEMGLEE) injection 5 Units  5 Units Subcutaneous Daily Amin, Ankit Chirag, MD   5 Units at 06/27/21 0856   ipratropium-albuterol (DUONEB) 0.5-2.5 (3) MG/3ML nebulizer solution 3 mL  3 mL Nebulization Q4H PRN Amin, Ankit Chirag, MD       metoprolol succinate (TOPROL-XL) 24 hr tablet 25 mg  25 mg Oral BID Amin, Ankit Chirag, MD   25 mg at 06/27/21 0851   metoprolol tartrate (LOPRESSOR) injection 5 mg  5 mg Intravenous Q4H PRN Amin, Ankit Chirag, MD       ondansetron (ZOFRAN) tablet 4 mg  4 mg Oral Q6H PRN Amin, Ankit Chirag, MD       Or   ondansetron (ZOFRAN) injection 4 mg  4 mg Intravenous Q6H PRN Amin, Ankit Chirag, MD       pantoprazole (PROTONIX) EC tablet 40 mg  40 mg Oral Daily Amin, Ankit Chirag, MD   40 mg at 06/27/21 0852   risperiDONE (RISPERDAL) tablet 0.5 mg  0.5 mg Oral QHS Amin, Ankit Chirag, MD   0.5 mg at 06/26/21 2115   senna-docusate (Senokot-S) tablet 1 tablet  1 tablet Oral QHS PRN Amin, Ankit Chirag, MD       simvastatin (ZOCOR) tablet 20 mg  20 mg Oral Daily Amin, Ankit Chirag, MD   20 mg at 06/27/21 0854   tamsulosin (FLOMAX) capsule 0.4 mg  0.4 mg Oral Daily Amin, Ankit Chirag, MD   0.4 mg at 06/27/21 0850   traZODone (DESYREL) tablet 50 mg  50 mg Oral QHS PRN Damita Lack, MD   50 mg at 06/26/21 2105     Discharge Medications: Please see discharge summary for a list of discharge medications.  Relevant Imaging Results:  Relevant Lab Results:   Additional Information SSN [id number]  Pete Pelt, RN

## 2021-06-27 NOTE — Assessment & Plan Note (Signed)
Hyponatremia labs with lower normal serum and urine osmolality and urinary sodium of 40, more consistent with hypervolemic hyponatremia. Continue to improve with diuresis, at 131 today -Monitor sodium while he is being diuresed.

## 2021-06-27 NOTE — Assessment & Plan Note (Signed)
Exam suggest likely secondary to fluid overload.  Getting IV Lasix.  Monitor renal function and urine output.  Baseline creatinine 1.7.  Admission creatinine 2.4>>2.24 Creatinine with slight improvement.  Good urinary output. -Monitor renal function -Avoid nephrotoxins

## 2021-06-27 NOTE — NC FL2 (Signed)
[place] LEVEL OF CARE SCREENING TOOL     IDENTIFICATION  Patient Name: Robert Conrad Birthdate: 1940/08/18 Sex: male Admission Date (Current Location): 06/25/2021  [facility] and [place] Number:  Engineering geologist and Address:  [facility], [address] [place], [place] [address]      Provider Number: [id number]  Attending Physician Name and Address:  Lorella Nimrod, MD  Relative Name and Phone Number:  Velda Shell Daughter     [phone]    Current Level of Care: Hospital Recommended Level of Care: [place] Prior Approval Number:    Date Approved/Denied:   PASRR Number: [id number]  Discharge Plan: SNF    Current Diagnoses: Patient Active Problem List   Diagnosis Date Noted   Acute on chronic diastolic CHF (congestive heart failure) ([place]) 06/27/2021   Acute urinary retention 06/27/2021   Acute bronchitis 06/25/2021   Hyponatremia 06/25/2021   Positive D dimer 06/25/2021   Benign prostatic hyperplasia without lower urinary tract symptoms    Hyperkalemia 11/24/2020   UTI (urinary tract infection) 70/35/0093   Acute metabolic encephalopathy 81/82/9937   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[place]) 11/24/2020   Acute delirium    Altered mental status 12/07/2018   PAD (peripheral artery disease) ([place]) 12/17/2016   DM2 (diabetes mellitus, type 2) ([place]) 12/17/2016   Essential hypertension 12/17/2016   Hyperlipidemia 12/17/2016   Renal artery stenosis (HCC) 10/05/2013   CAD (coronary artery disease), 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/27/2013   History of colon polyps 02/24/2013   Abdominal aortic aneurysm 02/24/2013    Orientation RESPIRATION BLADDER Height & Weight     Self  Normal External catheter Weight: 87.2 kg Height:  5\' 8"  (172.7 cm)  BEHAVIORAL SYMPTOMS/MOOD NEUROLOGICAL BOWEL NUTRITION STATUS      Continent Diet (Heart Healthy carb modified)  AMBULATORY STATUS COMMUNICATION OF  NEEDS Skin   Extensive Assist (Moderate with 2 people) Verbally Normal                       Personal Care Assistance Level of Assistance  Bathing, Feeding, Dressing Bathing Assistance: Maximum assistance Feeding assistance: Maximum assistance Dressing Assistance: Maximum assistance     Functional Limitations Info  Sight, Hearing, Speech Sight Info: Impaired Hearing Info: Impaired (hearing aide)      SPECIAL CARE FACTORS FREQUENCY  PT (By licensed PT), OT (By licensed OT)     PT Frequency: Min 5x weekly OT Frequency: Min 5x weekly            Contractures Contractures Info: Not present    Additional Factors Info  Code Status, Allergies Code Status Info: DNR Allergies Info: Ciprofloxacin           Current Medications (06/27/2021):  This is the current hospital active medication list Current Facility-Administered Medications  Medication Dose Route Frequency Provider Last Rate Last Admin   acetaminophen (TYLENOL) tablet 650 mg  650 mg Oral Q6H PRN Amin, Ankit Chirag, MD       amLODipine (NORVASC) tablet 5 mg  5 mg Oral Daily Amin, Ankit Chirag, MD   5 mg at 06/27/21 0850   amoxicillin-clavulanate (AUGMENTIN) 500-125 MG per tablet 500 mg  1 tablet Oral Q12H Lorella Nimrod, MD   500 mg at 06/27/21 1044   aspirin EC tablet 81 mg  81 mg Oral Daily Amin, Ankit Chirag, MD   81 mg at 06/27/21 0851   [START ON 06/28/2021] azithromycin (ZITHROMAX) tablet 250 mg  250 mg Oral Daily Lorella Nimrod, MD       Chlorhexidine Gluconate Cloth 2 % PADS 6 each  6 each Topical Daily Lorella Nimrod, MD   6 each at 06/27/21 1045   divalproex (DEPAKOTE) DR tablet 500 mg  500 mg Oral BID Damita Lack, MD   500 mg at 06/27/21 0850   furosemide (LASIX) injection 20 mg  20 mg Intravenous Daily Lorella Nimrod, MD   20 mg at 06/27/21 0848   guaiFENesin (ROBITUSSIN) 100 MG/5ML liquid 5 mL  5 mL Oral Q4H PRN Damita Lack, MD   5 mL at 06/27/21 0911   heparin injection 5,000 Units  5,000  Units Subcutaneous Q8H Amin, Ankit Chirag, MD   5,000 Units at 06/27/21 1403   hydrALAZINE (APRESOLINE) injection 10 mg  10 mg Intravenous Q4H PRN Amin, Ankit Chirag, MD       insulin aspart (novoLOG) injection 0-9 Units  0-9 Units Subcutaneous TID WC Amin, Ankit Chirag, MD   1 Units at 06/27/21 1404   insulin glargine-yfgn (SEMGLEE) injection 5 Units  5 Units Subcutaneous Daily Amin, Ankit Chirag, MD   5 Units at 06/27/21 0856   ipratropium-albuterol (DUONEB) 0.5-2.5 (3) MG/3ML nebulizer solution 3 mL  3 mL Nebulization Q4H PRN Amin, Ankit Chirag, MD       metoprolol succinate (TOPROL-XL) 24 hr tablet 25 mg  25 mg Oral BID Amin, Ankit Chirag, MD   25 mg at 06/27/21 0851   metoprolol tartrate (LOPRESSOR) injection 5 mg  5 mg Intravenous Q4H PRN Amin, Ankit Chirag, MD       ondansetron (ZOFRAN) tablet 4 mg  4 mg Oral Q6H PRN Amin, Ankit Chirag, MD       Or   ondansetron (ZOFRAN) injection 4 mg  4 mg Intravenous Q6H PRN Amin, Ankit Chirag, MD       pantoprazole (PROTONIX) EC tablet 40 mg  40 mg Oral Daily Amin, Ankit Chirag, MD   40 mg at 06/27/21 0852   risperiDONE (RISPERDAL) tablet 0.5 mg  0.5 mg Oral QHS Amin, Ankit Chirag, MD   0.5 mg at 06/26/21 2115   senna-docusate (Senokot-S) tablet 1 tablet  1 tablet Oral QHS PRN Amin, Ankit Chirag, MD       simvastatin (ZOCOR) tablet 20 mg  20 mg Oral Daily Amin, Ankit Chirag, MD   20 mg at 06/27/21 0854   tamsulosin (FLOMAX) capsule 0.4 mg  0.4 mg Oral Daily Amin, Ankit Chirag, MD   0.4 mg at 06/27/21 0850   traZODone (DESYREL) tablet 50 mg  50 mg Oral QHS PRN Damita Lack, MD   50 mg at 06/26/21 2105     Discharge Medications: Please see discharge summary for a list of discharge medications.  Relevant Imaging Results:  Relevant Lab Results:   Additional Information SSN [id number]  Pete Pelt, RN

## 2021-06-27 NOTE — Assessment & Plan Note (Signed)
Likely from underlying physical stressors.  No focal neurodeficit.  CT head is negative.  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  UA-negative.  No obvious signs of bacterial infection at this time.  B12 at lower normal limit of 202, ammonia and TSH within normal limit, valproic acid levels low at 28.  Mild persistent hyponatremia which can be contributory with underlying advanced dementia. -Continue to monitor

## 2021-06-27 NOTE — Assessment & Plan Note (Signed)
Chest was clear today.  COVID-19, flu and respiratory viral panel negative. Procalcitonin with slight worsening, at 0.22 today. No radiologic evidence of pneumonia. -Start him on Zithromax and Augmentin for concern of any superadded bacterial infection with bronchitis. -Supportive care

## 2021-06-28 DIAGNOSIS — E1151 Type 2 diabetes mellitus with diabetic peripheral angiopathy without gangrene: Secondary | ICD-10-CM

## 2021-06-28 DIAGNOSIS — R531 Weakness: Secondary | ICD-10-CM

## 2021-06-28 DIAGNOSIS — J209 Acute bronchitis, unspecified: Secondary | ICD-10-CM | POA: Diagnosis not present

## 2021-06-28 DIAGNOSIS — R338 Other retention of urine: Secondary | ICD-10-CM

## 2021-06-28 DIAGNOSIS — Z515 Encounter for palliative care: Secondary | ICD-10-CM

## 2021-06-28 DIAGNOSIS — Z794 Long term (current) use of insulin: Secondary | ICD-10-CM

## 2021-06-28 DIAGNOSIS — I5033 Acute on chronic diastolic (congestive) heart failure: Secondary | ICD-10-CM

## 2021-06-28 DIAGNOSIS — N179 Acute kidney failure, unspecified: Secondary | ICD-10-CM

## 2021-06-28 DIAGNOSIS — N189 Chronic kidney disease, unspecified: Secondary | ICD-10-CM

## 2021-06-28 DIAGNOSIS — G9341 Metabolic encephalopathy: Secondary | ICD-10-CM | POA: Diagnosis not present

## 2021-06-28 DIAGNOSIS — I739 Peripheral vascular disease, unspecified: Secondary | ICD-10-CM

## 2021-06-28 LAB — CBC
HCT: 37.1 % — ABNORMAL LOW (ref 39.0–52.0)
Hemoglobin: 12.5 g/dL — ABNORMAL LOW (ref 13.0–17.0)
MCH: 29.3 pg (ref 26.0–34.0)
MCHC: 33.7 g/dL (ref 30.0–36.0)
MCV: 86.9 fL (ref 80.0–100.0)
Platelets: 168 10*3/uL (ref 150–400)
RBC: 4.27 MIL/uL (ref 4.22–5.81)
RDW: 14.2 % (ref 11.5–15.5)
WBC: 9.1 10*3/uL (ref 4.0–10.5)
nRBC: 0 % (ref 0.0–0.2)

## 2021-06-28 LAB — GLUCOSE, CAPILLARY
Glucose-Capillary: 102 mg/dL — ABNORMAL HIGH (ref 70–99)
Glucose-Capillary: 130 mg/dL — ABNORMAL HIGH (ref 70–99)
Glucose-Capillary: 141 mg/dL — ABNORMAL HIGH (ref 70–99)
Glucose-Capillary: 129 mg/dL — ABNORMAL HIGH (ref 70–99)

## 2021-06-28 LAB — COMPREHENSIVE METABOLIC PANEL
ALT: 18 U/L (ref 0–44)
AST: 22 U/L (ref 15–41)
Albumin: 2.8 g/dL — ABNORMAL LOW (ref 3.5–5.0)
Alkaline Phosphatase: 36 U/L — ABNORMAL LOW (ref 38–126)
Anion gap: 11 (ref 5–15)
BUN: 46 mg/dL — ABNORMAL HIGH (ref 8–23)
CO2: 27 mmol/L (ref 22–32)
Calcium: 8.8 mg/dL — ABNORMAL LOW (ref 8.9–10.3)
Chloride: 91 mmol/L — ABNORMAL LOW (ref 98–111)
Creatinine, Ser: 2.56 mg/dL — ABNORMAL HIGH (ref 0.61–1.24)
GFR, Estimated: 24 mL/min — ABNORMAL LOW (ref >60)
Glucose, Bld: 108 mg/dL — ABNORMAL HIGH (ref 70–99)
Potassium: 4.6 mmol/L (ref 3.5–5.1)
Sodium: 129 mmol/L — ABNORMAL LOW (ref 135–145)
Total Bilirubin: 0.8 mg/dL (ref 0.3–1.2)
Total Protein: 6.3 g/dL — ABNORMAL LOW (ref 6.5–8.1)

## 2021-06-28 NOTE — Assessment & Plan Note (Signed)
Hyponatremia labs with lower normal serum and urine osmolality and urinary sodium of 40, more consistent with hypervolemic hyponatremia. Sodium again at 129 today. -Monitor sodium

## 2021-06-28 NOTE — Progress Notes (Signed)
Progress Note   Patient: Robert Conrad [id number] DOB: [birth year] DOA: 06/25/2021     3 DOS: the patient was seen and examined on 06/28/2021   Brief hospital course: Taken from H&P.  Robert Conrad is a 81 y.o. 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   Per family patient has overall been doing well since his previous UTI in July 2022.  But about 1 week ago his daughter had URI thereafter he started developing symptoms of URI, cough and congestion.  But over the last 48 hours he started having progressive weakness, confusion and tendency of falling.  Denies any fevers, chills or other complaints.  Per family typically eats a good meal but over the past few days he has had poor appetite.  He is also not been taking his torsemide routinely.  In ED he appears confused, without any focal neurodeficit.  Labs pertinent for AKI, creatinine of 2.42, baseline around 1.7, hyponatremia which is consistent with hypervolemic hyponatremia with borderline low osmolality of urine and serum, urinary sodium of 40, BNP at 538, mild hyperkalemia which has been resolved.  UA was negative.  Chest x-ray suggestive of pulmonary vascular congestion.  CT head was negative. COVID-19 and flu PCR negative.  Respiratory viral panel negative.  Procalcitonin at 0.16 which is inconclusive.  Ammonia, TSH and B12 within normal limits.  Folate pending.  Concern of CHF, echocardiogram pending.  He was started on IV Lasix as appears volume overload with pulmonary vascular congestion and lower extremity edema.  2/24: IV Lasix was discontinued today for concern of increasing creatinine.  Clinically appears euvolemic.  Remains lethargic with some persistent upper respiratory symptoms.  On room air. Family will consider rehab and outpatient palliative care.   Assessment and Plan: * Acute metabolic  encephalopathy- (present on admission) Becoming confused intermittently, per wife telling some on relevant stories and some concern of hallucinations which is common in this age group with advanced underlying dementia.  Likely from underlying physical stressors.  No focal neurodeficit.  CT head is negative.  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  UA-negative.  No obvious signs of bacterial infection at this time.  B12 at lower normal limit of 202, ammonia and TSH within normal limit, valproic acid levels low at 28.  Mild persistent hyponatremia which can be contributory with underlying advanced dementia. -Continue to monitor   Acute bronchitis Chest was clear today.  COVID-19, flu and respiratory viral panel negative. Procalcitonin  at 0.22 . No radiologic evidence of pneumonia. 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  Getting IV Lasix.  Monitor renal function and urine output.  Baseline creatinine 1.7.  Admission creatinine 2.4>>2.24>>2.56.  Good urinary output. -We will hold IV diuresis -Monitor renal function -Avoid nephrotoxins  Acute on chronic diastolic CHF (congestive heart failure) (HCC) BNP was elevated on admission.  Patient with history of CAD.  Echocardiogram with normal EF but grade 1 diastolic dysfunction.  Initially appears volume up and responding well to IV diuresis.  Patient has positive history for exertional dyspnea and orthopnea.  Was sleeping on recliner for some time, unable to lay flat due to breathing  difficulties.  All consistent with diastolic CHF.  Clinically appears euvolemic today. -Hold IV Lasix due to increasing creatinine-can start p.o. from tomorrow if renal  functions remained stable -Daily BMP and weight -Strict intake and output  Acute urinary retention Apparently Foley was placed in ED for concern of urinary retention. Per wife he was voiding fine before.  She wants to keep catheter for comfort reasons as he does not have to get up that frequently. I explained to her that goal can be achieved with condom catheter.  Foley catheter was removed yesterday, voiding good.  Hyponatremia Hyponatremia labs with lower normal serum and urine osmolality and urinary sodium of 40, more consistent with hypervolemic hyponatremia. Sodium again at 129 today. -Monitor sodium   CAD (coronary artery disease), autologous vein bypass graft- (present on admission) Currently without any chest pain  -continue aspirin, beta-blocker and statin  DM2 (diabetes mellitus, type 2) ([place]) -Sliding scale and Accu-Cheks.  CBG currently within goal - Semglee 5 units daily.  - Holding off on home regimen.  Essential hypertension- (present on admission) Blood pressure within goal -Continue home Norvasc, metoprolol.  -Continue IV Lasix.  IV.    Positive D dimer Nonspecific, low suspicion for PE.  Unable to get CTA due to his renal dysfunction.  Currently on room air. -Can do VQ scan if needed  PAD (peripheral artery disease) ([place])- (present on admission) -Continue aspirin and statin.  Hyperlipidemia- (present on admission) -Continue statin  Subjective: Patient continued to have some cough.  Palliative care discussion was going on with wife, caregiver and daughter when seen today.  Wife is concerned of him becoming more confused intermittently.  He was telling them some nonsense stories, also concern of some hallucination.  Physical Exam: Vitals:   06/28/21 0741 06/28/21 0800 06/28/21 1556 06/28/21 1557  BP: (!) 105/45  (!) 142/65   Pulse: 72  71   Resp: (!) 21  17   Temp: 98.3 F (36.8 C)   98 F (36.7 C)  TempSrc:    Oral  SpO2: 95% 95% 93%   Weight:   86.5 kg    Height:       General.  Chronically ill-appearing elderly man, in no acute distress. Pulmonary.  Lungs clear bilaterally, normal respiratory effort. CV.  Regular rate and rhythm, no JVD, rub or murmur. Abdomen.  Soft, nontender, nondistended, BS positive. CNS.  Alert and oriented .  No focal neurologic deficit. Extremities.  No edema, no cyanosis, pulses intact and symmetrical. Psychiatry.  Judgment and insight appears impaired.  Data Reviewed: I reviewed prior notes and labs.  Family Communication: Discussed with wife, daughter and caregiver at bedside.  Disposition: Status is: Inpatient Remains inpatient appropriate because: Severity of illness   Planned Discharge Destination: Skilled nursing facility  DVT prophylaxis.  Subcu heparin  Time spent: 45 minutes  This record has been created using Systems analyst. Errors have been sought and corrected,but may not always be located. Such creation errors do not reflect on the standard of care.  Author: Lorella Nimrod, MD 06/28/2021 5:02 PM  For on call review [email or web address].

## 2021-06-28 NOTE — Care Management Important Message (Signed)
Important Message  Patient Details  Name: Robert Conrad MRN: [id number] Date of Birth: 09/15/1940   Medicare Important Message Given:  Yes     [name] 06/28/2021, 11:19 AM

## 2021-06-28 NOTE — Assessment & Plan Note (Signed)
Exam suggest likely secondary to fluid overload.  Getting IV Lasix.  Monitor renal function and urine output.  Baseline creatinine 1.7.  Admission creatinine 2.4>>2.24>>2.56.  Good urinary output. -We will hold IV diuresis -Monitor renal function -Avoid nephrotoxins

## 2021-06-28 NOTE — Assessment & Plan Note (Addendum)
Apparently Foley was placed in ED for concern of urinary retention. Per wife he was voiding fine before.  She wants to keep catheter for comfort reasons as he does not have to get up that frequently. I explained to her that goal can be achieved with condom catheter.  Foley catheter was removed yesterday, voiding good.

## 2021-06-28 NOTE — Progress Notes (Signed)
Physical Therapy Treatment Patient Details Name: Robert Conrad Co MRN: [id number] DOB: [birth year] Today's Date: 06/28/2021   History of Present Illness Pt is an 81 y/o M with PMH: DM, HTN, PAD, AAA, and renal atery stenosis who presents to ED via EMS d/t SOB and AMS. CT of head and UA were both negative. W/u revealed hyponatrmia and AKI with elevated creatinine. CXR suggestive of CHF and pt appears to be fluid overloaded per attending's note.    PT Comments    Patient tolerated session fairly, agreeable to treatment. Upon arrival patient was supine in bed asleep with caregiver at bedside. Patient required significant verbal and tactile cueing to awake/remain awake. Patient became more alert when EOB. Continue to require Max-total assist +2 to complete supine<>sitting transfers. Sitting balance remains poor, with patient unable to remain in upright/midline position when performing tasks in static sitting. X2 Sit to stand transfers from elevated EOB was completed at Min A (+2HHA) to complete. Patient continues to require manual facilitation of LLE and heavy cues for weight shifting to take steps over towards HOB. Patient is progressing towards his goals, and would continue to benefit from skilled physical therapy in order to optimize patient's return to PLOF. Continue to recommend STR upon discharge from acute hospitalization.    Recommendations for follow up therapy are one component of a multi-disciplinary discharge planning process, led by the attending physician.  Recommendations may be updated based on patient status, additional functional criteria and insurance authorization.  Follow Up Recommendations  Skilled nursing-short term rehab (<3 hours/day)     Assistance Recommended at Discharge Frequent or constant Supervision/Assistance  Patient can return home with the following Two people to help with walking and/or transfers;Two people to help with bathing/dressing/bathroom   Equipment  Recommendations  Other (comment) (TBD)    Recommendations for Other Services       Precautions / Restrictions Precautions Precautions: Fall Restrictions Weight Bearing Restrictions: No     Mobility  Bed Mobility Overal bed mobility: Needs Assistance Bed Mobility: Sit to Supine     Supine to sit: Max assist, +2 for physical assistance, +2 for safety/equipment Sit to supine: Total assist, +2 for physical assistance, +2 for safety/equipment   General bed mobility comments: doesn't follow commands consistently. Needed total assist for return back to bed and +2 for sliding up and repositioning.    Transfers Overall transfer level: Needs assistance Equipment used: 2 person hand held assist Transfers: Sit to/from Stand Sit to Stand: Min assist, +2 physical assistance                Ambulation/Gait Ambulation/Gait assistance: Max assist, +2 physical assistance, +2 safety/equipment Gait Distance (Feet): 2 Feet Assistive device: None Gait Pattern/deviations: Step-to pattern       General Gait Details: patient required manual facilitation of LLE and heavy cues for weight shiftingto take steps over towards [facility], with significant verbal cueing patient was able to demonstrate actively moveing RLE towards LLE 1 time during side stepping   Stairs             Wheelchair Mobility    Modified Rankin (Stroke Patients Only)       Balance Overall balance assessment: Needs assistance Sitting-balance support: Bilateral upper extremity supported, Feet supported Sitting balance-Leahy Scale: Poor Sitting balance - Comments: requires at least MIN A for majority of session to sustain static sitting, patient loses ability to maintain in upright position when performing tasks in static sitting (washing face with towel) Postural control: Right lateral  lean, Posterior lean Standing balance support: Bilateral upper extremity supported Standing balance-Leahy Scale: Zero Standing  balance comment: 2 person assist                            Cognition Arousal/Alertness: Lethargic Behavior During Therapy: Flat affect Overall Cognitive Status: Difficult to assess                                 General Comments: required constant verbal and tactile (sternal rubs/arm rubs) to keep patient awake and alert throughout session        Exercises Other Exercises Other Exercises: Caregiver and daughter educated on productive cough noticed throughout session, and how sitting EOB could be beneficial in helping to clear flem.    General Comments        Pertinent Vitals/Pain Pain Assessment Pain Assessment: No/denies pain Pain Intervention(s): Limited activity within patient's tolerance, Monitored during session, Repositioned    Home Living                          Prior Function            PT Goals (current goals can now be found in the care plan section) Acute Rehab PT Goals Patient Stated Goal: to get stronger PT Goal Formulation: With family Time For Goal Achievement: 07/10/21 Potential to Achieve Goals: Fair Additional Goals Additional Goal #1: Pt will be able to perform bed mobility/transfers with supervision and safe technique in order to improve functional independence Progress towards PT goals: Progressing toward goals    Frequency    Min 2X/week      PT Plan Current plan remains appropriate    Co-evaluation PT/OT/SLP Co-Evaluation/Treatment: Yes Reason for Co-Treatment: Complexity of the patient's impairments (multi-system involvement);For patient/therapist safety;To address functional/ADL transfers;Necessary to address cognition/behavior during functional activity PT goals addressed during session: Mobility/safety with mobility;Balance OT goals addressed during session: ADL's and self-care      AM-PAC PT "6 Clicks" Mobility   Outcome Measure  Help needed turning from your back to your side while in a  flat bed without using bedrails?: A Lot Help needed moving from lying on your back to sitting on the side of a flat bed without using bedrails?: A Lot Help needed moving to and from a bed to a chair (including a wheelchair)?: A Lot Help needed standing up from a chair using your arms (e.g., wheelchair or bedside chair)?: A Lot Help needed to walk in hospital room?: Total Help needed climbing 3-5 steps with a railing? : Total 6 Click Score: 10    End of Session   Activity Tolerance: Patient limited by lethargy Patient left: in bed;with bed alarm set;with family/visitor present Nurse Communication: Mobility status;Precautions PT Visit Diagnosis: Muscle weakness (generalized) (M62.81);Difficulty in walking, not elsewhere classified (R26.2);History of falling (Z91.81)     Time: 1345-1406 PT Time Calculation (min) (ACUTE ONLY): 21 min  Charges:  $Therapeutic Activity: 8-22 mins                     Iva Boop, PT  06/28/21. 2:35 PM

## 2021-06-28 NOTE — Plan of Care (Signed)

## 2021-06-28 NOTE — Progress Notes (Signed)
Occupational Therapy Treatment Patient Details Name: Robert Conrad Doenges MRN: [id number] DOB: 06/18/1940 Today's Date: 06/28/2021   History of present illness Pt is an 81 y/o M with PMH: DM, HTN, PAD, AAA, and renal atery stenosis who presents to ED via EMS d/t SOB and AMS. CT of head and UA were both negative. W/u revealed hyponatrmia and AKI with elevated creatinine. CXR suggestive of CHF and pt appears to be fluid overloaded per attending's note.   OT comments  Pt seem for OT co-tx with PT to address safety with ADLs/ADL transfers. Co-tx most appropriate given pt's confusion as well as low activity tolerance. Pt sleeping when PT/OT enter room and requires multimodal cueing/stimulation to attend/participate/rouse. requires MAX A +2 to come to EOB sitting and demos P sitting balance, requiring MIN A most of the time to sustain static sitting. Pt able to CTS x2 trials with MIN A +2 with arm in arm technique and b/l foot/knee blocking. He benefits from tactile cues as he is severely HOH. OT engages pt in seated UB g/h tasks such as washing face with MIN/MOD A and hand over hand for tactile cues and to maintain grasp. Pt returned to bed with TOTAL A end of session with all needs met and in reach. Pt's family and caregiver educated on safely repositioning pt to encourage sitting up to help pt clear secretions.    Recommendations for follow up therapy are one component of a multi-disciplinary discharge planning process, led by the attending physician.  Recommendations may be updated based on patient status, additional functional criteria and insurance authorization.    Follow Up Recommendations  Skilled nursing-short term rehab (<3 hours/day)    Assistance Recommended at Discharge Frequent or constant Supervision/Assistance  Patient can return home with the following  Two people to help with walking and/or transfers;A lot of help with bathing/dressing/bathroom   Equipment Recommendations  None  recommended by OT    Recommendations for Other Services      Precautions / Restrictions Precautions Precautions: Fall Restrictions Weight Bearing Restrictions: No       Mobility Bed Mobility Overal bed mobility: Needs Assistance Bed Mobility: Sit to Supine     Supine to sit: Max assist, +2 for physical assistance, +2 for safety/equipment Sit to supine: Total assist, +2 for physical assistance, +2 for safety/equipment   General bed mobility comments: doesn't follow commands consistently. Needed total assist for return back to bed and +2 for sliding up and repositioning.    Transfers Overall transfer level: Needs assistance Equipment used: 2 person hand held assist Transfers: Sit to/from Stand Sit to Stand: Min assist, +2 physical assistance           General transfer comment: with b/l foot/knee blocking and using tactile cues d/t severely HOH     Balance Overall balance assessment: Needs assistance Sitting-balance support: Bilateral upper extremity supported, Feet supported Sitting balance-Leahy Scale: Poor Sitting balance - Comments: requires at least MIN A majority of static sitting time. Postural control: Right lateral lean, Posterior lean Standing balance support: Bilateral upper extremity supported Standing balance-Leahy Scale: Zero Standing balance comment: 2p assist                           ADL either performed or assessed with clinical judgement   ADL Overall ADL's : Needs assistance/impaired Eating/Feeding: Minimal assistance;Sitting Eating/Feeding Details (indicate cue type and reason): for hand to mouth with cup with lid and straw as well as cues  to suction for sips of water. Grooming: Wash/dry face;Brushing hair;Minimal assistance;Moderate assistance;Sitting Grooming Details (indicate cue type and reason): MIN/MOD A to assist pt in grasping and retaining grasp of wash cloth and then comb                                     Extremity/Trunk Assessment              Vision       Perception     Praxis      Cognition Arousal/Alertness: Lethargic Behavior During Therapy: Flat affect Overall Cognitive Status: Difficult to assess                                 General Comments: required constant verbal and tactile (sternal rubs/arm rubs) to keep patient awake and alert throughout session        Exercises Other Exercises Other Exercises: OT ed with pt's caregiver and dtr re: assisting pt to EOB sitting safely and encouraging coughing d/t wet sounding nature of his coughs to help encourage pt to clear secretions.    Shoulder Instructions       General Comments      Pertinent Vitals/ Pain       Pain Assessment Pain Assessment: PAINAD Breathing: normal Negative Vocalization: occasional moan/groan, low speech, negative/disapproving quality Facial Expression: smiling or inexpressive Body Language: tense, distressed pacing, fidgeting Consolability: no need to console PAINAD Score: 2 Pain Location: some general grimmace with mobilization, but does not localize, unable to quantify Pain Descriptors / Indicators: Grimacing Pain Intervention(s): Limited activity within patient's tolerance, Monitored during session  Home Living                                          Prior Functioning/Environment              Frequency  Min 2X/week        Progress Toward Goals  OT Goals(current goals can now be found in the care plan section)  Progress towards OT goals: Progressing toward goals  Acute Rehab OT Goals Patient Stated Goal: to get him stronger so he can eventually come home OT Goal Formulation: With family Time For Goal Achievement: 07/10/21 Potential to Achieve Goals: [place] Discharge plan remains appropriate    Co-evaluation    PT/OT/SLP Co-Evaluation/Treatment: Yes Reason for Co-Treatment: Complexity of the patient's impairments  (multi-system involvement);Necessary to address cognition/behavior during functional activity PT goals addressed during session: Mobility/safety with mobility OT goals addressed during session: ADL's and self-care      AM-PAC OT "6 Clicks" Daily Activity     Outcome Measure   Help from another person eating meals?: None Help from another person taking care of personal grooming?: A Little Help from another person toileting, which includes using toliet, bedpan, or urinal?: A Lot Help from another person bathing (including washing, rinsing, drying)?: A Lot Help from another person to put on and taking off regular upper body clothing?: A Little Help from another person to put on and taking off regular lower body clothing?: A Lot 6 Click Score: 16    End of Session Equipment Utilized During Treatment: Gait belt  OT Visit Diagnosis: Unsteadiness on feet (R26.81);Muscle weakness (generalized) (M62.81);Other symptoms and  signs involving cognitive function   Activity Tolerance Patient tolerated treatment well   Patient Left in bed;with call bell/phone within reach;with bed alarm set;with family/visitor present   Nurse Communication Mobility status        Time: 8721-5872 OT Time Calculation (min): 23 min  Charges: OT General Charges $OT Visit: 1 Visit OT Treatments $Self Care/Home Management : 8-22 mins  Gerrianne Scale, [place], OTR/L ascom [phone] 06/28/21, 5:18 PM

## 2021-06-28 NOTE — TOC Progression Note (Signed)
Transition of Care [facility]) - Progression Note    Patient Details  Name: Robert Conrad MRN: [id number] Date of Birth: [birth year]  Transition of Care [facility]) CM/SW [place], RN Phone Number: 06/28/2021, 2:51 PM  Clinical Narrative:   Only bed offer is from [facility].  Family will tour [facility] tomorrow and let TOC know if they choose SNF    Expected Discharge Plan:  (TBD) Barriers to Discharge: Continued Medical Work up  Expected Discharge Plan and Services Expected Discharge Plan:  (TBD)   Discharge Planning Services: CM Consult   Living arrangements for the past 2 months: Single Family Home                                       Social Determinants of Health (SDOH) Interventions    Readmission Risk Interventions No flowsheet data found.

## 2021-06-28 NOTE — Consult Note (Signed)
Consultation Note Date: 06/28/2021   Patient Name: Robert Conrad  DOB: 1941-03-11  MRN: [id number]  Age / Sex: 81 y.o., male  PCP: Albina Billet, MD Referring Physician: Lorella Nimrod, MD  Reason for Consultation: Establishing goals of care  HPI/Patient Profile: 81 y.o. 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  Patient was asleep for the majority of this discussion.  During our discussion, Dr. Reesa Chew was also present.  She provided a clear outline and medical update regarding the patient's current diagnosis and prognosis.  I introduced Palliative Medicine as specialized medical care for people living with serious illness. It focuses on providing relief from the symptoms and stress of a serious illness. The goal is to improve quality of life for both the patient and the family.  As far as functional and nutritional status prior to admission family endorses patient was active and mostly independent.  Debbie the private aide came every day to help him get dressed and to transport him and his wife to various appointments and outings.  However, Jackelyn Poling shares patient only became confused, weak, and had difficulty breathing approximately a week and a half ago.  We discussed patient's current illness and what it means in the larger context of patient's on-going co-morbidities. I attempted to elicit values and goals of  care important to the patient.  We would like to have the patient return home but is in agreement and understands that he is functionally too debilitated to return home at this time.  They are all in agreement to have patient evaluated for skilled nursing facility.  Education offered regarding concept specific to human mortality and the limitations of medical interventions to prolong life when the body begins to fail.  Therapeutic silence and active listening provided for patient's family to share their thoughts and emotions regarding the patient's current health status.  Family is facing treatment option decisions and anticipatory care needs.  They have a very realistic understanding of the patient's current situation.  They are holding hope that he will get better but understand that this may be the beginning of a more significant decline.   Discussed with patient/family the importance of continued conversation with family and the medical providers regarding overall plan of care and treatment options, ensuring decisions are within the context of the patients values and GOCs.    Outpatient services were described and offered.  Current plan is to treat the treatable with antibiotics, have patient evaluated by PT and OT, and investigate which SNF family would like patient to be discharged to.  Questions and concerns were addressed. The family was encouraged to call with questions or concerns.   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  Constitutional:      General: He is not in acute distress.    Appearance: He is not toxic-appearing.  HENT:     Head: Normocephalic and atraumatic.  Cardiovascular:     Rate and Rhythm: Normal rate.  Pulmonary:     Breath sounds: Examination of the left-middle field reveals rhonchi. Examination of the right-lower field reveals rhonchi. Examination of the left-lower field reveals rhonchi. Rhonchi present.     Comments: Deep, non-productive cough Abdominal:     Palpations: Abdomen is soft.  Musculoskeletal:     Cervical back: Normal range of motion.     Comments: Generalized weakness    Palliative Assessment/Data: 40%     I discussed this patient's plan of care with patient, patient's wife, daughter, and private care aide Debbie.  Thank you for this consult. Palliative medicine will continue to follow and assist holistically.   Time Total: 75 minutes Greater than 50%  of this time was spent counseling and coordinating care related to the above assessment and plan.  Signed by: Jordan Hawks, DNP, FNP-BC Palliative Medicine    Please contact Palliative Medicine Team phone at [phone] for questions and concerns.  For individual provider: See Shea Evans

## 2021-06-28 NOTE — Assessment & Plan Note (Signed)
Chest was clear today.  COVID-19, flu and respiratory viral panel negative. Procalcitonin  at 0.22 . No radiologic evidence of pneumonia. -Continue Zithromax and Augmentin for concern of any superadded bacterial infection with bronchitis-complete a 5-day course -Supportive care

## 2021-06-28 NOTE — Assessment & Plan Note (Signed)
Becoming confused intermittently, per wife telling some on relevant stories and some concern of hallucinations which is common in this age group with advanced underlying dementia.  Likely from underlying physical stressors.  No focal neurodeficit.  CT head is negative.  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  UA-negative.  No obvious signs of bacterial infection at this time.  B12 at lower normal limit of 202, ammonia and TSH within normal limit, valproic acid levels low at 28.  Mild persistent hyponatremia which can be contributory with underlying advanced dementia. -Continue to monitor

## 2021-06-28 NOTE — Assessment & Plan Note (Signed)
BNP was elevated on admission.  Patient with history of CAD.  Echocardiogram with normal EF but grade 1 diastolic dysfunction.  Initially appears volume up and responding well to IV diuresis.  Patient has positive history for exertional dyspnea and orthopnea.  Was sleeping on recliner for some time, unable to lay flat due to breathing difficulties.  All consistent with diastolic CHF.  Clinically appears euvolemic today. -Hold IV Lasix due to increasing creatinine-can start p.o. from tomorrow if renal functions remained stable -Daily BMP and weight -Strict intake and output

## 2021-06-29 DIAGNOSIS — G9341 Metabolic encephalopathy: Secondary | ICD-10-CM | POA: Diagnosis not present

## 2021-06-29 LAB — BASIC METABOLIC PANEL
Anion gap: 10 (ref 5–15)
BUN: 56 mg/dL — ABNORMAL HIGH (ref 8–23)
CO2: 26 mmol/L (ref 22–32)
Calcium: 9.1 mg/dL (ref 8.9–10.3)
Chloride: 92 mmol/L — ABNORMAL LOW (ref 98–111)
Creatinine, Ser: 2.48 mg/dL — ABNORMAL HIGH (ref 0.61–1.24)
GFR, Estimated: 25 mL/min — ABNORMAL LOW (ref >60)
Glucose, Bld: 114 mg/dL — ABNORMAL HIGH (ref 70–99)
Potassium: 4.8 mmol/L (ref 3.5–5.1)
Sodium: 128 mmol/L — ABNORMAL LOW (ref 135–145)

## 2021-06-29 LAB — CBC
HCT: 40.6 % (ref 39.0–52.0)
Hemoglobin: 13.4 g/dL (ref 13.0–17.0)
MCH: 28.9 pg (ref 26.0–34.0)
MCHC: 33 g/dL (ref 30.0–36.0)
MCV: 87.5 fL (ref 80.0–100.0)
Platelets: 198 10*3/uL (ref 150–400)
RBC: 4.64 MIL/uL (ref 4.22–5.81)
RDW: 14.1 % (ref 11.5–15.5)
WBC: 10.5 10*3/uL (ref 4.0–10.5)
nRBC: 0 % (ref 0.0–0.2)

## 2021-06-29 LAB — GLUCOSE, CAPILLARY
Glucose-Capillary: 112 mg/dL — ABNORMAL HIGH (ref 70–99)
Glucose-Capillary: 120 mg/dL — ABNORMAL HIGH (ref 70–99)
Glucose-Capillary: 141 mg/dL — ABNORMAL HIGH (ref 70–99)
Glucose-Capillary: 147 mg/dL — ABNORMAL HIGH (ref 70–99)

## 2021-06-29 MED ORDER — TORSEMIDE 20 MG PO TABS
10.0000 mg | ORAL_TABLET | Freq: Every day | ORAL | Status: DC
  Administered 2021-06-30 – 2021-07-01 (×2): 10 mg via ORAL
  Filled 2021-06-29 (×2): qty 1

## 2021-06-29 NOTE — TOC Progression Note (Signed)
Transition of Care [facility]) - Progression Note    Patient Details  Name: Robert Conrad MRN: [id number] Date of Birth: 21-Jan-1941  Transition of Care [facility]) CM/SW Contact  Eileen Stanford, LCSW Phone Number: 06/29/2021, 12:37 PM  Clinical Narrative:   CSW spoke with pt's daughter and confirmed they did tour [facility] and they are accepting the bed offer. Per progression rounds anticipated dc would be Monday. Pt's daughter notified. Will update [facility] as well.  Auth started for admission 2/27.    Expected Discharge Plan:  (TBD) Barriers to Discharge: Continued Medical Work up  Expected Discharge Plan and Services Expected Discharge Plan:  (TBD)   Discharge Planning Services: CM Consult   Living arrangements for the past 2 months: Single Family Home                                       Social Determinants of Health (SDOH) Interventions    Readmission Risk Interventions No flowsheet data found.

## 2021-06-29 NOTE — Progress Notes (Signed)
Progress Note   Patient: Robert Conrad JAS:[id number] DOB: 05/12/1940 DOA: 06/25/2021     4 DOS: the patient was seen and examined on 06/29/2021   Brief hospital course: Taken from H&P.  Robert Conrad is a 81 y.o. 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   Per family patient has overall been doing well since his previous UTI in July 2022.  But about 1 week ago his daughter had URI thereafter he started developing symptoms of URI, cough and congestion.  But over the last 48 hours he started having progressive weakness, confusion and tendency of falling.  Denies any fevers, chills or other complaints.  Per family typically eats a good meal but over the past few days he has had poor appetite.  He is also not been taking his torsemide routinely.  In ED he appears confused, without any focal neurodeficit.  Labs pertinent for AKI, creatinine of 2.42, baseline around 1.7, hyponatremia which is consistent with hypervolemic hyponatremia with borderline low osmolality of urine and serum, urinary sodium of 40, BNP at 538, mild hyperkalemia which has been resolved.  UA was negative.  Chest x-ray suggestive of pulmonary vascular congestion.  CT head was negative. COVID-19 and flu PCR negative.  Respiratory viral panel negative.  Procalcitonin at 0.16 which is inconclusive.  Ammonia, TSH and B12 within normal limits.  Folate pending.  Concern of CHF, echocardiogram pending.  He was started on IV Lasix as appears volume overload with pulmonary vascular congestion and lower extremity edema.  2/24: IV Lasix was discontinued today for concern of increasing creatinine.  Clinically appears euvolemic.  Remains lethargic with some persistent upper respiratory symptoms.  On room air. Family will consider rehab and outpatient palliative care.  2/25: Continue to have some cough, otherwise  stable.  [facility].   Assessment and Plan: * Acute metabolic encephalopathy- (present on admission) Becoming confused intermittently, per wife telling some on relevant stories and some concern of hallucinations which is common in this age group with advanced underlying dementia.  No focal neurodeficit.  CT head is negative.  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  UA-negative.  No obvious signs of bacterial infection at this time.  B12 at lower normal limit of 202, ammonia and TSH within normal limit, valproic acid levels low at 28.  Mild persistent hyponatremia which can be contributory with underlying advanced dementia. -Continue to monitor   Acute bronchitis Chest was clear today.  COVID-19, flu and respiratory viral panel negative. Procalcitonin  at 0.22 . No radiologic evidence of pneumonia. 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  Getting IV Lasix.  Monitor renal function and urine output.  Baseline creatinine 1.7.  Admission creatinine 2.4>>2.24>>2.56>.2.48.  Good urinary output. -We will hold IV diuresis for another day -Start p.o. Lasix 20 mg daily from tomorrow -Avoid nephrotoxins  Acute on chronic diastolic CHF (congestive heart failure) (H[facility]) BNP was elevated on admission.  Patient with history of CAD.  Echocardiogram with normal EF but grade 1 diastolic dysfunction.  Initially appears volume up and responding well to IV diuresis.  Patient has positive history  for exertional dyspnea and orthopnea.  Was sleeping on recliner for some time, unable to lay flat due to breathing difficulties.  All consistent with diastolic CHF.  Clinically  appears euvolemic today. -Start p.o. Lasix from tomorrow -Daily BMP and weight -Strict intake and output  Acute urinary retention Apparently Foley was placed in ED for concern of urinary retention. Per wife he was voiding fine before.  She wants to keep catheter for comfort reasons as he does not have to get up that frequently. I explained to her that goal can be achieved with condom catheter.  Foley catheter was removed 2/23, voiding good.  Hyponatremia Hyponatremia labs with lower normal serum and urine osmolality and urinary sodium of 40, more consistent with hypervolemic hyponatremia. Sodium again at 128 today. -Monitor sodium   CAD (coronary artery disease), autologous vein bypass graft- (present on admission) Currently without any chest pain  -continue aspirin, beta-blocker and statin  DM2 (diabetes mellitus, type 2) ([place]) -Sliding scale and Accu-Cheks.  CBG currently within goal - Semglee 5 units daily.  - Holding off on home regimen.  Essential hypertension- (present on admission) Blood pressure within goal -Continue home Norvasc, metoprolol.  -Also starting  p.o. Lasix.    Positive D dimer Nonspecific, low suspicion for PE.  Unable to get CTA due to his renal dysfunction.  Currently on room air. -Can do VQ scan if needed  PAD (peripheral artery disease) ([place])- (present on admission) -Continue aspirin and statin.  Hyperlipidemia- (present on admission) -Continue statin   Subjective: Patient was seen and examined today.  Continues to have some cough, per daughter still weak.  Saturating well on room air.  Physical Exam: Vitals:   06/29/21 0500 06/29/21 0634 06/29/21 0728 06/29/21 1140  BP:  (!) 149/67 (!) 153/69 (!) 131/57  Pulse:  71 73 69  Resp:  16 (!) 21 15  Temp:  99.4 F (37.4 C) 98.1 F (36.7 C) 98 F (36.7 C)  TempSrc:  Oral Oral Oral  SpO2:  90% 90% 91%  Weight: 89.7 kg     Height:       General.  Hard of hearing, ill-appearing elderly man,  in no acute distress. Pulmonary.  Lungs clear bilaterally, normal respiratory effort. CV.  Regular rate and rhythm, no JVD, rub or murmur. Abdomen.  Soft, nontender, nondistended, BS positive. CNS.  Alert and oriented to self.  No focal neurologic deficit. Extremities.  No edema, no cyanosis, pulses intact and symmetrical. Psychiatry.  Judgment and insight appears impaired.  Data Reviewed: Reviewed prior notes and results.  Family Communication: Discussed with daughter at bedside  Disposition: Status is: Inpatient Remains inpatient appropriate because: Severity of illness.  Awaiting insurance authorization for SNF.  Medically stable now   Planned Discharge Destination: Skilled nursing facility  DVT prophylaxis.  Heparin subcu  Time spent: 40 minutes  This record has been created using Systems analyst. Errors have been sought and corrected,but may not always be located. Such creation errors do not reflect on the standard of care.  Author: Lorella Nimrod, MD 06/29/2021 12:59 PM  For on call review [email or web address].

## 2021-06-29 NOTE — Assessment & Plan Note (Signed)
Hyponatremia labs with lower normal serum and urine osmolality and urinary sodium of 40, more consistent with hypervolemic hyponatremia. Sodium again at 128 today. -Monitor sodium

## 2021-06-29 NOTE — Assessment & Plan Note (Signed)
Blood pressure within goal -Continue home Norvasc, metoprolol.  -Also starting  p.o. Lasix.

## 2021-06-29 NOTE — Assessment & Plan Note (Signed)
Chest was clear today.  COVID-19, flu and respiratory viral panel negative. Procalcitonin  at 0.22 . No radiologic evidence of pneumonia. -Continue Zithromax and Augmentin for concern of any superadded bacterial infection with bronchitis-complete a 5-day course -Supportive care

## 2021-06-29 NOTE — Assessment & Plan Note (Signed)
Becoming confused intermittently, per wife telling some on relevant stories and some concern of hallucinations which is common in this age group with advanced underlying dementia.  No focal neurodeficit.  CT head is negative.  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  UA-negative.  No obvious signs of bacterial infection at this time.  B12 at lower normal limit of 202, ammonia and TSH within normal limit, valproic acid levels low at 28.  Mild persistent hyponatremia which can be contributory with underlying advanced dementia. -Continue to monitor

## 2021-06-29 NOTE — Assessment & Plan Note (Signed)
Apparently Foley was placed in ED for concern of urinary retention. Per wife he was voiding fine before.  She wants to keep catheter for comfort reasons as he does not have to get up that frequently. I explained to her that goal can be achieved with condom catheter.  Foley catheter was removed 2/23, voiding good.

## 2021-06-29 NOTE — Assessment & Plan Note (Signed)
Exam suggest likely secondary to fluid overload.  Getting IV Lasix.  Monitor renal function and urine output.  Baseline creatinine 1.7.  Admission creatinine 2.4>>2.24>>2.56>.2.48.  Good urinary output. -We will hold IV diuresis for another day -Start p.o. Lasix 20 mg daily from tomorrow -Avoid nephrotoxins

## 2021-06-29 NOTE — Plan of Care (Signed)

## 2021-06-29 NOTE — Assessment & Plan Note (Signed)
BNP was elevated on admission.  Patient with history of CAD.  Echocardiogram with normal EF but grade 1 diastolic dysfunction.  Initially appears volume up and responding well to IV diuresis.  Patient has positive history for exertional dyspnea and orthopnea.  Was sleeping on recliner for some time, unable to lay flat due to breathing difficulties.  All consistent with diastolic CHF.  Clinically appears euvolemic today. -Start p.o. Lasix from tomorrow -Daily BMP and weight -Strict intake and output

## 2021-06-30 ENCOUNTER — Inpatient Hospital Stay: Payer: Medicare HMO

## 2021-06-30 DIAGNOSIS — G9341 Metabolic encephalopathy: Secondary | ICD-10-CM | POA: Diagnosis not present

## 2021-06-30 LAB — GLUCOSE, CAPILLARY
Glucose-Capillary: 106 mg/dL — ABNORMAL HIGH (ref 70–99)
Glucose-Capillary: 114 mg/dL — ABNORMAL HIGH (ref 70–99)
Glucose-Capillary: 117 mg/dL — ABNORMAL HIGH (ref 70–99)
Glucose-Capillary: 126 mg/dL — ABNORMAL HIGH (ref 70–99)

## 2021-06-30 LAB — CBC
HCT: 40.2 % (ref 39.0–52.0)
Hemoglobin: 13.3 g/dL (ref 13.0–17.0)
MCH: 28.7 pg (ref 26.0–34.0)
MCHC: 33.1 g/dL (ref 30.0–36.0)
MCV: 86.8 fL (ref 80.0–100.0)
Platelets: 192 10*3/uL (ref 150–400)
RBC: 4.63 MIL/uL (ref 4.22–5.81)
RDW: 13.9 % (ref 11.5–15.5)
WBC: 11.4 10*3/uL — ABNORMAL HIGH (ref 4.0–10.5)
nRBC: 0 % (ref 0.0–0.2)

## 2021-06-30 LAB — BASIC METABOLIC PANEL
Anion gap: 10 (ref 5–15)
BUN: 61 mg/dL — ABNORMAL HIGH (ref 8–23)
CO2: 25 mmol/L (ref 22–32)
Calcium: 8.9 mg/dL (ref 8.9–10.3)
Chloride: 91 mmol/L — ABNORMAL LOW (ref 98–111)
Creatinine, Ser: 2.54 mg/dL — ABNORMAL HIGH (ref 0.61–1.24)
GFR, Estimated: 25 mL/min — ABNORMAL LOW (ref >60)
Glucose, Bld: 116 mg/dL — ABNORMAL HIGH (ref 70–99)
Potassium: 5.2 mmol/L — ABNORMAL HIGH (ref 3.5–5.1)
Sodium: 126 mmol/L — ABNORMAL LOW (ref 135–145)

## 2021-06-30 LAB — CORTISOL-AM, BLOOD: Cortisol - AM: 17.2 ug/dL (ref 6.7–22.6)

## 2021-06-30 LAB — OSMOLALITY, URINE: Osmolality, Ur: 492 mOsm/kg (ref 300–900)

## 2021-06-30 LAB — OSMOLALITY: Osmolality: 290 mOsm/kg (ref 275–295)

## 2021-06-30 LAB — SODIUM, URINE, RANDOM: Sodium, Ur: 64 mmol/L

## 2021-06-30 MED ORDER — SODIUM ZIRCONIUM CYCLOSILICATE 10 G PO PACK
10.0000 g | PACK | Freq: Every day | ORAL | Status: DC
  Administered 2021-06-30 – 2021-07-01 (×2): 10 g via ORAL
  Filled 2021-06-30 (×2): qty 1

## 2021-06-30 MED ORDER — ALBUTEROL SULFATE (2.5 MG/3ML) 0.083% IN NEBU
2.5000 mg | INHALATION_SOLUTION | RESPIRATORY_TRACT | Status: DC | PRN
Start: 2021-06-30 — End: 2021-07-05

## 2021-06-30 MED ORDER — IPRATROPIUM-ALBUTEROL 0.5-2.5 (3) MG/3ML IN SOLN
3.0000 mL | Freq: Three times a day (TID) | RESPIRATORY_TRACT | Status: DC
  Administered 2021-07-01: 3 mL via RESPIRATORY_TRACT
  Filled 2021-06-30: qty 3

## 2021-06-30 MED ORDER — IPRATROPIUM-ALBUTEROL 0.5-2.5 (3) MG/3ML IN SOLN
3.0000 mL | Freq: Four times a day (QID) | RESPIRATORY_TRACT | Status: DC
  Administered 2021-06-30 (×2): 3 mL via RESPIRATORY_TRACT
  Filled 2021-06-30 (×2): qty 3

## 2021-06-30 NOTE — Assessment & Plan Note (Signed)
Apparently Foley was placed in ED for concern of urinary retention. Per wife he was voiding fine before.  She wants to keep catheter for comfort reasons as he does not have to get up that frequently. I explained to her that goal can be achieved with condom catheter.  Foley catheter was removed 2/23, voiding good. -Check postvoid residual volume due to worsening creatinine

## 2021-06-30 NOTE — Assessment & Plan Note (Signed)
Hyponatremia labs with lower normal serum and urine osmolality and urinary sodium of 40, more consistent with hypervolemic hyponatremia. Sodium continued to get worse, at 126 today -Nephrology consult -Monitor sodium

## 2021-06-30 NOTE — Assessment & Plan Note (Signed)
Exam suggest likely secondary to fluid overload. Baseline creatinine 1.7.  Admission creatinine 2.4>>2.24>>2.56>.2.48>>2.54.  Good urinary output. Creatinine continued to get worse despite holding diuretic for the past 2 days. -Nephrology consult -Avoid nephrotoxins

## 2021-06-30 NOTE — Plan of Care (Signed)

## 2021-06-30 NOTE — Assessment & Plan Note (Addendum)
BNP was elevated on admission.  Patient with history of CAD.  Echocardiogram with normal EF but grade 1 diastolic dysfunction.  Initially appears volume up and responding well to IV diuresis.  Patient has positive history for exertional dyspnea and orthopnea.  Was sleeping on recliner for some time, unable to lay flat due to breathing difficulties.  All consistent with diastolic CHF.  We were holding diuretics for the past 2 days.  Few basal crackles today Repeat chest x-ray with worsening of pulmonary vascular congestion -Start with 10 mg of p.o. torsemide. -Daily BMP and weight -Strict intake and output

## 2021-06-30 NOTE — Progress Notes (Signed)
Progress Note   Patient: Robert Conrad DGL:[id number] DOB: 02-02-1941 DOA: 06/25/2021     5 DOS: the patient was seen and examined on 06/30/2021   Brief hospital course: Taken from H&P.  [name] is a 81 y.o. 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   Per family patient has overall been doing well since his previous UTI in July 2022.  But about 1 week ago his daughter had URI thereafter he started developing symptoms of URI, cough and congestion.  But over the last 48 hours he started having progressive weakness, confusion and tendency of falling.  Denies any fevers, chills or other complaints.  Per family typically eats a good meal but over the past few days he has had poor appetite.  He is also not been taking his torsemide routinely.  In ED he appears confused, without any focal neurodeficit.  Labs pertinent for AKI, creatinine of 2.42, baseline around 1.7, hyponatremia which is consistent with hypervolemic hyponatremia with borderline low osmolality of urine and serum, urinary sodium of 40, BNP at 538, mild hyperkalemia which has been resolved.  UA was negative.  Chest x-ray suggestive of pulmonary vascular congestion.  CT head was negative. COVID-19 and flu PCR negative.  Respiratory viral panel negative.  Procalcitonin at 0.16 which is inconclusive.  Ammonia, TSH and B12 within normal limits.  Folate pending.  Concern of CHF, echocardiogram pending.  He was started on IV Lasix as appears volume overload with pulmonary vascular congestion and lower extremity edema.  2/24: IV Lasix was discontinued today for concern of increasing creatinine.  Clinically appears euvolemic.  Remains lethargic with some persistent upper respiratory symptoms.  On room air. Family will consider rehab and outpatient palliative care.  2/25: Continue to have some cough, otherwise  stable.  [facility].  2/26: Patient seems little more lethargic when seen today.  Per daughter and wife at bedside patient needs 100% assistance with feeding.  Appetite remained poor.  Currently on 2 L of oxygen Worsening hyponatremia and renal function with mild hyperkalemia. Nephrology was consulted to assist with hyponatremia and worsening renal function. Lokelma was ordered for hyperkalemia. Nephrology ordered renal ultrasound. Repeat chest x-ray with worsening of pulmonary vascular congestion.   Assessment and Plan: * Acute metabolic encephalopathy- (present on admission) Becoming confused intermittently. No focal neurodeficit.  CT head is negative.  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  UA-negative.  No obvious signs of bacterial infection at this time.  B12 at lower normal limit of 202, ammonia and TSH within normal limit, valproic acid levels low at 28.  Mild persistent hyponatremia which can be contributory with underlying advanced dementia. -Continue to monitor   Acute bronchitis- (present on admission) Chest was clear today.  COVID-19, flu and respiratory viral panel negative. Procalcitonin  at 0.22 . No radiologic evidence of pneumonia. 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 Baseline creatinine 1.7.  Admission creatinine 2.4>>2.24>>2.56>.2.48>>2.54.  Good urinary output. Creatinine continued to get worse despite holding diuretic for the past 2 days. -Nephrology consult -Avoid nephrotoxins  Acute on chronic diastolic CHF (congestive heart failure) ([place])  BNP was elevated on admission.  Patient with history of CAD.  Echocardiogram  with normal EF but grade 1 diastolic dysfunction.  Initially appears volume up and responding well to IV diuresis.  Patient has positive history for exertional dyspnea and orthopnea.  Was sleeping on recliner for some time, unable to lay flat due to breathing difficulties.  All consistent with diastolic CHF.  We were holding diuretics for the past 2 days.  Few basal crackles today Repeat chest x-ray with worsening of pulmonary vascular congestion -Start with 10 mg of p.o. torsemide. -Daily BMP and weight -Strict intake and output  Acute urinary retention Apparently Foley was placed in ED for concern of urinary retention. Per wife he was voiding fine before.  She wants to keep catheter for comfort reasons as he does not have to get up that frequently. I explained to her that goal can be achieved with condom catheter.  Foley catheter was removed 2/23, voiding good. -Check postvoid residual volume due to worsening creatinine  Hyponatremia Hyponatremia labs with lower normal serum and urine osmolality and urinary sodium of 40, more consistent with hypervolemic hyponatremia. Sodium continued to get worse, at 126 today -Nephrology consult -Monitor sodium   CAD (coronary artery disease), autologous vein bypass graft- (present on admission) Currently without any chest pain  -continue aspirin, beta-blocker and statin  DM2 (diabetes mellitus, type 2) ([place]) -Sliding scale and Accu-Cheks.  CBG currently within goal - Semglee 5 units daily.  - Holding off on home regimen.  Essential hypertension- (present on admission) Blood pressure within goal -Continue home Norvasc, metoprolol.  -Also starting  p.o. torsemide.    Positive D dimer Nonspecific, low suspicion for PE.  Unable to get CTA due to his renal dysfunction.  Currently on room air. -Can do VQ scan if needed  PAD (peripheral artery disease) ([place])- (present on admission) -Continue aspirin and statin.  Hyperlipidemia- (present on  admission) -Continue statin   Subjective: Patient appears lethargic but sleeping comfortably when seen today.  Per daughter and wife he is more lethargic than before and requiring 100% assistance in feeding.  Appetite remains poor.  They were very concerned about his worsening lethargy, renal function and sodium.  Physical Exam: Vitals:   06/30/21 0428 06/30/21 0500 06/30/21 0757 06/30/21 1150  BP: (!) 130/52  (!) 150/80 (!) 144/63  Pulse: 67  71 67  Resp: 18  18 18   Temp: 98.7 F (37.1 C)  98.2 F (36.8 C) 98.1 F (36.7 C)  TempSrc: Axillary  Oral Oral  SpO2: 92%  90% 97%  Weight:  89 kg    Height:       General.  Chronically ill-appearing elderly man, in no acute distress. Pulmonary.  Few basal crackles bilaterally, normal respiratory effort. CV.  Regular rate and rhythm, no JVD, rub or murmur. Abdomen.  Soft, nontender, nondistended, BS positive. CNS.  Somnolent, no apparent focal deficit Extremities.  No edema, no cyanosis, pulses intact and symmetrical. Psychiatry.  Judgment and insight appears impaired.  Data Reviewed: I reviewed prior notes, labs and imaging  Family Communication: Discussed with daughter and wife at bedside  Disposition: Status is: Inpatient Remains inpatient appropriate because: Severity of illness   Planned Discharge Destination: Skilled nursing facility  DVT prophylaxis.  Subcu heparin  Time spent: 50 minutes  This record has been created using Systems analyst. Errors have been sought and corrected,but may not always be located. Such creation errors do not reflect on the standard of care.  Author: Lorella Nimrod, MD 06/30/2021 2:33 PM  For on call review [email or web address].

## 2021-06-30 NOTE — Progress Notes (Signed)
Robert Conrad  MRN: [id number]  DOB/AGE: 1941/04/03 81 y.o.  Primary Care Physician:Tate, Leona Carry, MD  Admit date: 06/25/2021  Chief Complaint:  Chief Complaint  Patient presents with   Shortness of Breath   Weakness    S-Pt presented on  06/25/2021 with  Chief Complaint  Patient presents with   Shortness of Breath   Weakness  . Patient is 81 year old Caucasian male with a past medical history of CKD stage IV, abdominal aortic aneurysm of 4.2 cm, BPH, hyperlipidemia, hypertension, coronary disease, diabetes mellitus type 2 who was brought to the hospital with chief complaint of confusion and weakness.  History of present illness date back to a week ago when patient developed upper respiratory symptoms thereafter patient became progressively weak and confused. Upon evaluation in the ER patient was found to have AKI, hyponatremia, hyperkalemia with chest x-ray showing pulmonary vascular congestion. Patient was admitted with fluid overload and patient was started on IV Lasix Nephrology was consulted today for comanagement Patient is well-known to our practice Patient had seen Dr. Candiss Norse on January 17, 2023Patient is lying comfortably in the bed Patient offers no new specific complaint. Patient's wife and daughter were present in the room. Patient family main concern was about his current kidney function.  Medications    amLODipine  5 mg Oral Daily   amoxicillin-clavulanate  1 tablet Oral Q12H   aspirin EC  81 mg Oral Daily   azithromycin  250 mg Oral Daily   divalproex  500 mg Oral BID   heparin  5,000 Units Subcutaneous Q8H   insulin aspart  0-9 Units Subcutaneous TID WC   insulin glargine-yfgn  5 Units Subcutaneous Daily   metoprolol succinate  25 mg Oral BID   pantoprazole  40 mg Oral Daily   risperiDONE  0.5 mg Oral QHS   simvastatin  20 mg Oral Daily   sodium zirconium cyclosilicate  10 g Oral Daily   tamsulosin  0.4 mg Oral Daily   torsemide  10 mg Oral Daily          [id number] from the symptoms mentioned above,there are no other symptoms referable to all systems reviewed.  Physical Exam: Vital signs in last 24 hours: Temp:  [97.6 F (36.4 C)-98.9 F (37.2 C)] 98.2 F (36.8 C) (02/26 0757) Pulse Rate:  [67-74] 71 (02/26 0757) Resp:  [15-18] 18 (02/26 0757) BP: (123-150)/(52-88) 150/80 (02/26 0757) SpO2:  [90 %-98 %] 90 % (02/26 0757) Weight:  [89 kg] 89 kg (02/26 0500) Weight change: 2.5 kg Last BM Date : 06/26/21  Intake/Output from previous day: 02/25 0701 - 02/26 0700 In: -  Out: 900 [Urine:900] Total I/O In: -  Out: 650 [Urine:650]   Physical Exam:  General- pt is  lethargic but arousable  Resp- No acute REsp distress,Minimal NO Rhonchi  CVS- S1S2 regular in rate and rhythm  GIT- BS+, soft, Non tender , Non distended  EXT- No LE Edema,  No Cyanosis Patient currently has no Dependent edema   Lab Results:  CBC  Recent Labs    06/29/21 0419 06/30/21 0415  WBC 10.5 11.4*  HGB 13.4 13.3  HCT 40.6 40.2  PLT 198 192    BMET  Recent Labs    06/29/21 0420 06/30/21 0415  NA 128* 126*  K 4.8 5.2*  CL 92* 91*  CO2 26 25  GLUCOSE 114* 116*  BUN 56* 61*  CREATININE 2.48* 2.54*  CALCIUM 9.1 8.9      Most recent Creatinine  trend  Lab Results  Component Value Date   CREATININE 2.54 (H) 06/30/2021   CREATININE 2.48 (H) 06/29/2021   CREATININE 2.56 (H) 06/28/2021      MICRO   Recent Results (from the past 240 hour(s))  Resp Panel by RT-PCR (Flu A&B, Covid) Nasopharyngeal Swab     Status: None   Collection Time: 06/25/21  3:27 PM   Specimen: Nasopharyngeal Swab; Nasopharyngeal(NP) swabs in vial transport medium  Result Value Ref Range Status   SARS Coronavirus 2 by RT PCR NEGATIVE NEGATIVE Final    Comment: (NOTE) SARS-CoV-2 target nucleic acids are NOT DETECTED.  The SARS-CoV-2 RNA is generally detectable in upper respiratory specimens during the acute phase of infection. The  lowest concentration of SARS-CoV-2 viral copies this assay can detect is 138 copies/mL. A negative result does not preclude SARS-Cov-2 infection and should not be used as the sole basis for treatment or other patient management decisions. A negative result may occur with  improper specimen collection/handling, submission of specimen other than nasopharyngeal swab, presence of viral mutation(s) within the areas targeted by this assay, and inadequate number of viral copies(<138 copies/mL). A negative result must be combined with clinical observations, patient history, and epidemiological information. The expected result is Negative.  Fact Sheet for Patients:  EntrepreneurPulse.com.au  Fact Sheet for Healthcare Providers:  IncredibleEmployment.be  This test is no t yet approved or cleared by the [place] FDA and  has been authorized for detection and/or diagnosis of SARS-CoV-2 by FDA under an Emergency Use Authorization (EUA). This EUA will remain  in effect (meaning this test can be used) for the duration of the COVID-19 declaration under Section 564(b)(1) of the Act, 21 U.S.C.section 360bbb-3(b)(1), unless the authorization is terminated  or revoked sooner.       Influenza A by PCR NEGATIVE NEGATIVE Final   Influenza B by PCR NEGATIVE NEGATIVE Final    Comment: (NOTE) The Xpert Xpress SARS-CoV-2/FLU/RSV plus assay is intended as an aid in the diagnosis of influenza from Nasopharyngeal swab specimens and should not be used as a sole basis for treatment. Nasal washings and aspirates are unacceptable for Xpert Xpress SARS-CoV-2/FLU/RSV testing.  Fact Sheet for Patients: EntrepreneurPulse.com.au  Fact Sheet for Healthcare Providers: IncredibleEmployment.be  This test is not yet approved or cleared by the [place] FDA and has been authorized for detection and/or diagnosis of SARS-CoV-2 by FDA under  an Emergency Use Authorization (EUA). This EUA will remain in effect (meaning this test can be used) for the duration of the COVID-19 declaration under Section 564(b)(1) of the Act, 21 U.S.C. section 360bbb-3(b)(1), unless the authorization is terminated or revoked.  Performed at [facility], [place], [place] [address]   Respiratory (~20 pathogens) panel by PCR     Status: None   Collection Time: 06/25/21  3:27 PM   Specimen: Nasopharyngeal Swab; Respiratory  Result Value Ref Range Status   Adenovirus NOT DETECTED NOT DETECTED Final   Coronavirus 229E NOT DETECTED NOT DETECTED Final    Comment: (NOTE) The Coronavirus on the Respiratory Panel, DOES NOT test for the novel  Coronavirus (2019 nCoV)    Coronavirus HKU1 NOT DETECTED NOT DETECTED Final   Coronavirus NL63 NOT DETECTED NOT DETECTED Final   Coronavirus OC43 NOT DETECTED NOT DETECTED Final   Metapneumovirus NOT DETECTED NOT DETECTED Final   Rhinovirus / Enterovirus NOT DETECTED NOT DETECTED Final   Influenza A NOT DETECTED NOT DETECTED Final   Influenza B NOT DETECTED NOT  DETECTED Final   Parainfluenza Virus 1 NOT DETECTED NOT DETECTED Final   Parainfluenza Virus 2 NOT DETECTED NOT DETECTED Final   Parainfluenza Virus 3 NOT DETECTED NOT DETECTED Final   Parainfluenza Virus 4 NOT DETECTED NOT DETECTED Final   Respiratory Syncytial Virus NOT DETECTED NOT DETECTED Final   Bordetella pertussis NOT DETECTED NOT DETECTED Final   Bordetella Parapertussis NOT DETECTED NOT DETECTED Final   Chlamydophila pneumoniae NOT DETECTED NOT DETECTED Final   Mycoplasma pneumoniae NOT DETECTED NOT DETECTED Final    Comment: Performed at [facility], [place] [address] [place], [place] [address]         Impression:   1)Renal    Acute kidney injury Patient has AKI secondary to ATN AKI on CKD AKI versus CKD progression Patient has CKD stage IV Patient has CKD stage IV going back to 2014 Patient has  CKD most likely secondary to hypertension Possible contribution from age associated decline Possible contribution from history of multiple AKI's    Creatinine trend 2023 2.2--2.5 2022 1.7--4.1-AKI 2022.2--3.1-AKI 2015 1.2--1.6 2014  1.5   2)HTN    Blood pressure is stable    3)Anemia of chronic disease  CBC Latest Ref Rng & Units 06/30/2021 06/29/2021 06/28/2021  WBC 4.0 - 10.5 K/uL 11.4(H) 10.5 9.1  Hemoglobin 13.0 - 17.0 g/dL 13.3 13.4 12.5(L)  Hematocrit 39.0 - 52.0 % 40.2 40.6 37.1(L)  Platelets 150 - 400 K/uL 192 198 168       HGb at goal (9--11)   4) Renal failure associated hyperphosphatemia Patient has history of renal failure hyperphosphatemia with phosphorus being high at 5 We will follow up on phosphorus levels   Lab Results  Component Value Date   CALCIUM 8.9 06/30/2021       5)Proteinuria Patient has known nephrotic range proteinuria Patient is not currently on any ACE or an ARB secondary to history of multiple AKI's Patient is having another episode of AKI we will hold off on starting RAS blockers   6) Electrolytes   BMP Latest Ref Rng & Units 06/30/2021 06/29/2021 06/28/2021  Glucose 70 - 99 mg/dL 116(H) 114(H) 108(H)  BUN 8 - 23 mg/dL 61(H) 56(H) 46(H)  Creatinine 0.61 - 1.24 mg/dL 2.54(H) 2.48(H) 2.56(H)  Sodium 135 - 145 mmol/L 126(L) 128(L) 129(L)  Potassium 3.5 - 5.1 mmol/L 5.2(H) 4.8 4.6  Chloride 98 - 111 mmol/L 91(L) 92(L) 91(L)  CO2 22 - 32 mmol/L 25 26 27   Calcium 8.9 - 10.3 mg/dL 8.9 9.1 8.8(L)     Sodium Hyponatremia Patient is hyponatremic most likely secondary to hypervolemia  Patient had hypervolemic hyponatremia at the time of presentation as patient sodium improved with Lasix Patient sodium is now trending down Patient BUN is trending up Patient hemoglobin has trended up from 11.5 at the time of presentation to now 13.3 Patient most likely has hypovolemia now We will hold off on starting IV fluids because of  propensity for developing hyperkalemia Will encourage p.o. intake In case patient sodium trends and need lower tomorrow we will give patient IV fluid    Potassium Hyperkalemia Patient is on [place]    7)Acid base  Co2 at goal     Plan:  For AKI/CKD We will ask for post void residual We will ask for renal ultrasound  For hyponatremia Patient serum cortisol was checked on July 22 it was within normal limits/it was not low Patient had hypervolemic hyponatremia at the time of presentation as patient sodium improved with diuretics  Patient sodium is now trending down Patient BUN is trending up Patient hemoglobin has trended up from 11.5 at the time of presentation to now 13.3 Patient most likely has hypovolemia now We will hold off on starting IV fluids because of propensity for developing hypervolemia Will encourage p.o. intake In case patient sodium trends any lower tomorrow then we will give patient IV fluid  For hyperkalemia Agree with [facility] for hyperkalemia   Sylar Voong s [facility] 06/30/2021, 9:03 AM

## 2021-06-30 NOTE — Assessment & Plan Note (Signed)
Chest was clear today.  COVID-19, flu and respiratory viral panel negative. Procalcitonin  at 0.22 . No radiologic evidence of pneumonia. -Continue Zithromax and Augmentin for concern of any superadded bacterial infection with bronchitis-complete a 5-day course -Supportive care

## 2021-06-30 NOTE — Assessment & Plan Note (Signed)
Becoming confused intermittently. No focal neurodeficit.  CT head is negative.  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  UA-negative.  No obvious signs of bacterial infection at this time.  B12 at lower normal limit of 202, ammonia and TSH within normal limit, valproic acid levels low at 28.  Mild persistent hyponatremia which can be contributory with underlying advanced dementia. -Continue to monitor

## 2021-06-30 NOTE — Assessment & Plan Note (Signed)
Blood pressure within goal -Continue home Norvasc, metoprolol.  -Also starting  p.o. torsemide.

## 2021-07-01 DIAGNOSIS — R531 Weakness: Secondary | ICD-10-CM | POA: Diagnosis not present

## 2021-07-01 DIAGNOSIS — I509 Heart failure, unspecified: Secondary | ICD-10-CM | POA: Diagnosis not present

## 2021-07-01 DIAGNOSIS — G9341 Metabolic encephalopathy: Secondary | ICD-10-CM | POA: Diagnosis not present

## 2021-07-01 DIAGNOSIS — N179 Acute kidney failure, unspecified: Secondary | ICD-10-CM | POA: Diagnosis not present

## 2021-07-01 LAB — BASIC METABOLIC PANEL
Anion gap: 15 (ref 5–15)
BUN: 63 mg/dL — ABNORMAL HIGH (ref 8–23)
CO2: 23 mmol/L (ref 22–32)
Calcium: 9 mg/dL (ref 8.9–10.3)
Chloride: 89 mmol/L — ABNORMAL LOW (ref 98–111)
Creatinine, Ser: 2.81 mg/dL — ABNORMAL HIGH (ref 0.61–1.24)
GFR, Estimated: 22 mL/min — ABNORMAL LOW (ref >60)
Glucose, Bld: 104 mg/dL — ABNORMAL HIGH (ref 70–99)
Potassium: 5.7 mmol/L — ABNORMAL HIGH (ref 3.5–5.1)
Sodium: 127 mmol/L — ABNORMAL LOW (ref 135–145)

## 2021-07-01 LAB — CBC
HCT: 40.8 % (ref 39.0–52.0)
Hemoglobin: 13.4 g/dL (ref 13.0–17.0)
MCH: 29 pg (ref 26.0–34.0)
MCHC: 32.8 g/dL (ref 30.0–36.0)
MCV: 88.3 fL (ref 80.0–100.0)
Platelets: 194 10*3/uL (ref 150–400)
RBC: 4.62 MIL/uL (ref 4.22–5.81)
RDW: 13.9 % (ref 11.5–15.5)
WBC: 13.4 10*3/uL — ABNORMAL HIGH (ref 4.0–10.5)
nRBC: 0 % (ref 0.0–0.2)

## 2021-07-01 LAB — PHOSPHORUS: Phosphorus: 6.2 mg/dL — ABNORMAL HIGH (ref 2.5–4.6)

## 2021-07-01 LAB — GLUCOSE, CAPILLARY
Glucose-Capillary: 113 mg/dL — ABNORMAL HIGH (ref 70–99)
Glucose-Capillary: 140 mg/dL — ABNORMAL HIGH (ref 70–99)
Glucose-Capillary: 162 mg/dL — ABNORMAL HIGH (ref 70–99)
Glucose-Capillary: 169 mg/dL — ABNORMAL HIGH (ref 70–99)

## 2021-07-01 LAB — PROCALCITONIN: Procalcitonin: 0.17 ng/mL

## 2021-07-01 LAB — ALBUMIN: Albumin: 3.1 g/dL — ABNORMAL LOW (ref 3.5–5.0)

## 2021-07-01 MED ORDER — BISACODYL 10 MG RE SUPP
10.0000 mg | Freq: Every day | RECTAL | Status: DC | PRN
  Administered 2021-07-01: 10 mg via RECTAL
  Filled 2021-07-01: qty 1

## 2021-07-01 MED ORDER — POLYETHYLENE GLYCOL 3350 17 G PO PACK
17.0000 g | PACK | Freq: Every day | ORAL | Status: DC
  Administered 2021-07-01 – 2021-07-04 (×4): 17 g via ORAL
  Filled 2021-07-01 (×4): qty 1

## 2021-07-01 MED ORDER — TRAMADOL HCL 50 MG PO TABS
50.0000 mg | ORAL_TABLET | Freq: Two times a day (BID) | ORAL | Status: DC | PRN
  Filled 2021-07-01 (×3): qty 1

## 2021-07-01 MED ORDER — PATIROMER SORBITEX CALCIUM 8.4 G PO PACK
16.8000 g | PACK | Freq: Every day | ORAL | Status: DC
  Administered 2021-07-01 – 2021-07-04 (×3): 16.8 g via ORAL
  Filled 2021-07-01 (×5): qty 2

## 2021-07-01 MED ORDER — IPRATROPIUM-ALBUTEROL 0.5-2.5 (3) MG/3ML IN SOLN
3.0000 mL | Freq: Two times a day (BID) | RESPIRATORY_TRACT | Status: DC
Start: 2021-07-01 — End: 2021-07-02
  Administered 2021-07-01: 3 mL via RESPIRATORY_TRACT
  Filled 2021-07-01: qty 3

## 2021-07-01 MED ORDER — FUROSEMIDE 10 MG/ML IJ SOLN
20.0000 mg | Freq: Every day | INTRAMUSCULAR | Status: DC
Start: 2021-07-01 — End: 2021-07-02
  Administered 2021-07-01 – 2021-07-02 (×2): 20 mg via INTRAVENOUS
  Filled 2021-07-01 (×2): qty 2

## 2021-07-01 NOTE — Progress Notes (Signed)
Progress Note   Patient: Robert Conrad ZOX:[id number] DOB: 07-10-1940 DOA: 06/25/2021     6 DOS: the patient was seen and examined on 07/01/2021   Brief hospital course: Taken from H&P.  Robert Conrad is a 81 y.o. 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   Per family patient has overall been doing well since his previous UTI in July 2022.  But about 1 week ago his daughter had URI thereafter he started developing symptoms of URI, cough and congestion.  But over the last 48 hours he started having progressive weakness, confusion and tendency of falling.  Denies any fevers, chills or other complaints.  Per family typically eats a good meal but over the past few days he has had poor appetite.  He is also not been taking his torsemide routinely.  In ED he appears confused, without any focal neurodeficit.  Labs pertinent for AKI, creatinine of 2.42, baseline around 1.7, hyponatremia which is consistent with hypervolemic hyponatremia with borderline low osmolality of urine and serum, urinary sodium of 40, BNP at 538, mild hyperkalemia which has been resolved.  UA was negative.  Chest x-ray suggestive of pulmonary vascular congestion.  CT head was negative. COVID-19 and flu PCR negative.  Respiratory viral panel negative.  Procalcitonin at 0.16 which is inconclusive.  Ammonia, TSH and B12 within normal limits.  Folate pending.  Concern of CHF, echocardiogram pending.  He was started on IV Lasix as appears volume overload with pulmonary vascular congestion and lower extremity edema.  2/24: IV Lasix was discontinued today for concern of increasing creatinine.  Clinically appears euvolemic.  Remains lethargic with some persistent upper respiratory symptoms.  On room air. Family will consider rehab and outpatient palliative care.  2/25: Continue to have some cough, otherwise  stable.  [facility].  2/26: Patient seems little more lethargic when seen today.  Per daughter and wife at bedside patient needs 100% assistance with feeding.  Appetite remained poor.  Currently on 2 L of oxygen Worsening hyponatremia and renal function with mild hyperkalemia. Nephrology was consulted to assist with hyponatremia and worsening renal function. Lokelma was ordered for hyperkalemia. Nephrology ordered renal ultrasound. Repeat chest x-ray with worsening of pulmonary vascular congestion.  2/27: Mild worsening of renal function and hyperkalemia at 5.7.  Patient received Lokelma with no improvement.  Switched with [employer].  Nephrology also hold torsemide and started him on low-dose Lasix 20 mg IV daily. Some improvement in p.o. intake this morning per caregiver.  Patient is high risk for deterioration and death.  Palliative care is on board   Assessment and Plan: * Acute metabolic encephalopathy- (present on admission) Becoming confused intermittently. No focal neurodeficit.  CT head is negative.  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  UA-negative.  No obvious signs of bacterial infection at this time.  B12 at lower normal limit of 202, ammonia and TSH within normal limit, valproic acid levels low at 28.  Mild persistent hyponatremia which can be contributory with underlying advanced dementia. -Continue to monitor   Acute bronchitis- (present on admission) Chest was clear today.  COVID-19, flu and respiratory viral panel negative. Procalcitonin  at 0.22 >.0.17 No radiologic evidence of pneumonia. 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  Admission creatinine  2.4>>2.24>>2.56>.2.48>>2.54>>2.81, with some worsening of BUN at 63.  Good urinary output, total urinary output recorded 1876 Creatinine continued to get worse despite holding diuretic. -Nephrology switched low-dose torsemide with low-dose IV Lasix. -Monitor renal function -Avoid nephrotoxins  Acute on chronic diastolic CHF (congestive heart failure) (HCC) BNP was elevated on admission.  Patient with history of CAD.  Echocardiogram with normal EF but grade 1 diastolic dysfunction.  Initially appears volume up and responding well to IV diuresis.  Patient has positive history for exertional dyspnea and orthopnea.  Was sleeping on recliner for some time, unable to lay flat due to breathing difficulties.  All consistent with diastolic CHF.  We were holding diuretics for the past 2 days.  Few basal crackles today Repeat chest x-ray with worsening of pulmonary vascular congestion -Low-dose torsemide was switched with 20 mg IV Lasix today -Daily BMP and weight -Strict intake and output  Acute urinary retention- (present on admission) Apparently Foley was placed in ED for concern of urinary retention. Per wife he was voiding fine before.  She wants to keep catheter for comfort reasons as he does not have to get up that frequently. I explained to her that goal can be achieved with condom catheter.  Foley catheter was removed 2/23, voiding good. -Check postvoid residual volume due to worsening creatinine  Hyponatremia- (present on admission) Hyponatremia labs with lower normal serum and urine osmolality and urinary sodium of 40, more consistent with hypervolemic hyponatremia. Sodium  at 127 today -Nephrology consult -Monitor sodium   CAD (coronary artery disease), autologous vein bypass graft- (present on admission) Currently without any chest pain  -continue aspirin, beta-blocker and statin  DM2 (diabetes mellitus, type 2) ([place]) -Sliding scale and Accu-Cheks.  CBG currently within goal -  Semglee 5 units daily.  - Holding off on home regimen.  Essential hypertension- (present on admission) Blood pressure within goal -Continue home Norvasc, metoprolol.  -On IV Lasix  Positive D dimer Nonspecific, low suspicion for PE.  Unable to get CTA due to his renal dysfunction.  Currently on room air. -Can do VQ scan if needed  PAD (peripheral artery disease) ([place])- (present on admission) -Continue aspirin and statin.  Hyperlipidemia- (present on admission) -Continue statin  Subjective: Patient was resting comfortably when seen today.  Caregiver at bedside and according to her he ate all of his breakfast this morning.  Needs assistance to feed.  No acute concern.  Physical Exam: Vitals:   07/01/21 0900 07/01/21 1100 07/01/21 1237 07/01/21 1522  BP: (!) 151/87 (!) 155/64 (!) 161/78 (!) 132/55  Pulse: 64 65 70 67  Resp: (!) 22 20 18    Temp: 98.7 F (37.1 C) 98.5 F (36.9 C) 99.5 F (37.5 C) 97.6 F (36.4 C)  TempSrc: Oral Oral Oral Oral  SpO2: 100%  99% 98%  Weight:      Height:       General.     In no acute distress. Pulmonary.  Lungs clear bilaterally, normal respiratory effort. CV.  Regular rate and rhythm, no JVD, rub or murmur. Abdomen.  Soft, nontender, nondistended, BS positive. CNS.  Somnolent, no apparent deficit Extremities.  No edema, no cyanosis, pulses intact and symmetrical. Psychiatry.  Judgment and insight appears impaired.  Data Reviewed: Prior notes and labs reviewed.  Family Communication: Discussed with caregiver at bedside  Disposition: Status is: Inpatient Remains inpatient  appropriate because: Severity of illness   Planned Discharge Destination: Skilled nursing facility  DVT prophylaxis.  Subcu heparin  Time spent: 45 minutes  This record has been created using Systems analyst. Errors have been sought and corrected,but may not always be located. Such creation errors do not reflect on the standard of  care.  Author: Lorella Nimrod, MD 07/01/2021 3:48 PM  For on call review [email or web address].

## 2021-07-01 NOTE — TOC Progression Note (Signed)
Transition of Care [facility]) - Progression Note    Patient Details  Name: Robert Conrad MRN: [id number] Date of Birth: 1940-11-16  Transition of Care [facility]) CM/SW [place], RN Phone Number: 07/01/2021, 3:41 PM  Clinical Narrative:   Patient not medically ready for discharge today.  Auth valid until 3/1  TOC to follow    Expected Discharge Plan:  (TBD) Barriers to Discharge: Continued Medical Work up  Expected Discharge Plan and Services Expected Discharge Plan:  (TBD)   Discharge Planning Services: CM Consult   Living arrangements for the past 2 months: Single Family Home                                       Social Determinants of Health (SDOH) Interventions    Readmission Risk Interventions No flowsheet data found.

## 2021-07-01 NOTE — Assessment & Plan Note (Signed)
Hyponatremia labs with lower normal serum and urine osmolality and urinary sodium of 40, more consistent with hypervolemic hyponatremia. Sodium  at 127 today -Nephrology consult -Monitor sodium

## 2021-07-01 NOTE — Assessment & Plan Note (Signed)
Baseline creatinine 1.7.  Admission creatinine 2.4>>2.24>>2.56>.2.48>>2.54>>2.81, with some worsening of BUN at 63.  Good urinary output, total urinary output recorded 1876 Creatinine continued to get worse despite holding diuretic. -Nephrology switched low-dose torsemide with low-dose IV Lasix. -Monitor renal function -Avoid nephrotoxins

## 2021-07-01 NOTE — Assessment & Plan Note (Signed)
Chest was clear today.  COVID-19, flu and respiratory viral panel negative. Procalcitonin  at 0.22 >.0.17 No radiologic evidence of pneumonia. -Continue Zithromax and Augmentin for concern of any superadded bacterial infection with bronchitis-complete a 5-day course -Supportive care

## 2021-07-01 NOTE — Progress Notes (Signed)
Pt is confused and repeatedly pulls off mask & O2 cannula

## 2021-07-01 NOTE — Assessment & Plan Note (Signed)
Blood pressure within goal -Continue home Norvasc, metoprolol.  -On IV Lasix

## 2021-07-01 NOTE — Assessment & Plan Note (Signed)
BNP was elevated on admission.  Patient with history of CAD.  Echocardiogram with normal EF but grade 1 diastolic dysfunction.  Initially appears volume up and responding well to IV diuresis.  Patient has positive history for exertional dyspnea and orthopnea.  Was sleeping on recliner for some time, unable to lay flat due to breathing difficulties.  All consistent with diastolic CHF.  We were holding diuretics for the past 2 days.  Few basal crackles today Repeat chest x-ray with worsening of pulmonary vascular congestion -Low-dose torsemide was switched with 20 mg IV Lasix today -Daily BMP and weight -Strict intake and output

## 2021-07-01 NOTE — Progress Notes (Signed)
Physical Therapy Treatment Patient Details Name: Robert Conrad MRN: [id number] DOB: [birth year] Today's Date: 07/01/2021   History of Present Illness Pt is an 81 y/o M with PMH: DM, HTN, PAD, AAA, and renal atery stenosis who presents to ED via EMS d/t SOB and AMS. CT of head and UA were both negative. W/u revealed hyponatrmia and AKI with elevated creatinine. CXR suggestive of CHF and pt appears to be fluid overloaded per attending's note.    PT Comments    Pt is making limited progress towards goals. Has been lethargic past few days however alert this date. Limited participation in therapy session with attempts made at meaningful there-ex. Educated  family on positioning to decrease risk of skin breakdown and to encourage any spontaneous activity. B UE tremors noted, chronic per family. Will continue to progress as able.  Recommendations for follow up therapy are one component of a multi-disciplinary discharge planning process, led by the attending physician.  Recommendations may be updated based on patient status, additional functional criteria and insurance authorization.  Follow Up Recommendations  Skilled nursing-short term rehab (<3 hours/day)     Assistance Recommended at Discharge Frequent or constant Supervision/Assistance  Patient can return home with the following Two people to help with walking and/or transfers;Two people to help with bathing/dressing/bathroom   Equipment Recommendations   (TBD)    Recommendations for Other Services       Precautions / Restrictions Precautions Precautions: Fall Restrictions Weight Bearing Restrictions: No     Mobility  Bed Mobility               General bed mobility comments: doesn't follow commands. Not safe to attempt OOB    Transfers                        Ambulation/Gait                   Stairs             Wheelchair Mobility    Modified Rankin (Stroke Patients Only)        Balance                                            Cognition Arousal/Alertness: Awake/alert Behavior During Therapy: Flat affect Overall Cognitive Status: History of cognitive impairments - at baseline                                 General Comments: pt awake but non verbal and doesn't follow commands.        Exercises Other Exercises Other Exercises: attempted ther-ex while in supine position.heavy cues for participation. Attempted B LE SLRs, hip add squeezes, hip abd/add, SAQ, and heel slides. B UE including elbow flexion/extension and shoulder flexion. No movement noted on command with random spontaneous movement of L UE to scratch his chin. Other Exercises: educated to family about positioning including floating heels with pillows and to encourage active movement of B LE. Also educated on need for active participation with therapy    General Comments        Pertinent Vitals/Pain Pain Assessment Pain Assessment: No/denies pain    Home Living  Prior Function            PT Goals (current goals can now be found in the care plan section) Acute Rehab PT Goals Patient Stated Goal: to get stronger PT Goal Formulation: With family Time For Goal Achievement: 07/10/21 Potential to Achieve Goals: Fair Progress towards PT goals: Progressing toward goals    Frequency    Min 2X/week      PT Plan Current plan remains appropriate    Co-evaluation              AM-PAC PT "6 Clicks" Mobility   Outcome Measure  Help needed turning from your back to your side while in a flat bed without using bedrails?: Total Help needed moving from lying on your back to sitting on the side of a flat bed without using bedrails?: Total Help needed moving to and from a bed to a chair (including a wheelchair)?: Total Help needed standing up from a chair using your arms (e.g., wheelchair or bedside chair)?: Total Help  needed to walk in hospital room?: Total Help needed climbing 3-5 steps with a railing? : Total 6 Click Score: 6    End of Session   Activity Tolerance:  (limited by cogntion) Patient left: in bed;with bed alarm set;with family/visitor present Nurse Communication: Mobility status;Precautions PT Visit Diagnosis: Muscle weakness (generalized) (M62.81);Difficulty in walking, not elsewhere classified (R26.2);History of falling (Z91.81)     Time: 3754-3606 PT Time Calculation (min) (ACUTE ONLY): 17 min  Charges:  $Therapeutic Activity: 8-22 mins                     Greggory Stallion, PT, DPT, GCS [phone]    [name] 07/01/2021, 2:13 PM

## 2021-07-01 NOTE — Assessment & Plan Note (Signed)
Becoming confused intermittently. No focal neurodeficit.  CT head is negative.  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  UA-negative.  No obvious signs of bacterial infection at this time.  B12 at lower normal limit of 202, ammonia and TSH within normal limit, valproic acid levels low at 28.  Mild persistent hyponatremia which can be contributory with underlying advanced dementia. -Continue to monitor

## 2021-07-01 NOTE — Care Management Important Message (Signed)
Important Message  Patient Details  Name: Robert Conrad MRN: [id number] Date of Birth: 26-Mar-1941   Medicare Important Message Given:  Yes     [name] 07/01/2021, 1:11 PM

## 2021-07-01 NOTE — Progress Notes (Addendum)
Robert Conrad  MRN: [id number]  DOB/AGE: 01-11-1941 81 y.o.  Primary Care Physician:Tate, Leona Carry, MD  Admit date: 06/25/2021  Chief Complaint:  Chief Complaint  Patient presents with   Shortness of Breath   Weakness    S-Pt presented on  06/25/2021 with  Chief Complaint  Patient presents with   Shortness of Breath   Weakness  . Patient is 81 year old Caucasian male with a past medical history of CKD stage IV, abdominal aortic aneurysm of 4.2 cm, BPH, hyperlipidemia, hypertension, coronary disease, diabetes mellitus type 2 who was brought to the hospital with chief complaint of confusion and weakness.  Patient is followed by Dr. Candiss Norse outpatient.  Patient seen currently resting quietly, personal care sitter and wife at bedside Tolerating meals without nausea and vomiting Denies shortness of breath, remains on 2 L nasal cannula Reportedly on room air at baseline   Medications    amLODipine  5 mg Oral Daily   amoxicillin-clavulanate  1 tablet Oral Q12H   aspirin EC  81 mg Oral Daily   divalproex  500 mg Oral BID   furosemide  20 mg Intravenous Daily   heparin  5,000 Units Subcutaneous Q8H   insulin aspart  0-9 Units Subcutaneous TID WC   insulin glargine-yfgn  5 Units Subcutaneous Daily   ipratropium-albuterol  3 mL Nebulization BID   metoprolol succinate  25 mg Oral BID   pantoprazole  40 mg Oral Daily   polyethylene glycol  17 g Oral Daily   risperiDONE  0.5 mg Oral QHS   simvastatin  20 mg Oral Daily   sodium zirconium cyclosilicate  10 g Oral Daily   tamsulosin  0.4 mg Oral Daily         PYP:PJKDT from the symptoms mentioned above,there are no other symptoms referable to all systems reviewed.  Physical Exam: Vital signs in last 24 hours: Temp:  [97.6 F (36.4 C)-99.5 F (37.5 C)] 99.5 F (37.5 C) (02/27 1237) Pulse Rate:  [59-70] 70 (02/27 1237) Resp:  [16-22] 18 (02/27 1237) BP: (116-166)/(54-87) 161/78 (02/27 1237) SpO2:  [91 %-100 %] 99 %  (02/27 1237) Weight:  [89.5 kg] 89.5 kg (02/27 0500) Weight change: 0.5 kg Last BM Date : 06/26/21  Intake/Output from previous day: 02/26 0701 - 02/27 0700 In: -  Out: 1876 [Urine:1876] Total I/O In: -  Out: 300 [Urine:300]   Physical Exam:  General- pt is  lethargic but arousable  Resp- No acute REsp distress, Crackles Lt > Rt  CVS- S1S2 regular in rate and rhythm  GIT- BS+, soft, Non tender , Non distended  EXT- No LE Edema,  No Cyanosis Patient currently has no Dependent edema   Lab Results:  CBC  Recent Labs    06/30/21 0415 07/01/21 0343  WBC 11.4* 13.4*  HGB 13.3 13.4  HCT 40.2 40.8  PLT 192 194     BMET  Recent Labs    06/30/21 0415 07/01/21 0343  NA 126* 127*  K 5.2* 5.7*  CL 91* 89*  CO2 25 23  GLUCOSE 116* 104*  BUN 61* 63*  CREATININE 2.54* 2.81*  CALCIUM 8.9 9.0       Most recent Creatinine trend  Lab Results  Component Value Date   CREATININE 2.81 (H) 07/01/2021   CREATININE 2.54 (H) 06/30/2021   CREATININE 2.48 (H) 06/29/2021       MICRO   Recent Results (from the past 240 hour(s))  Resp Panel by RT-PCR (Flu A&B, Covid) Nasopharyngeal Swab  Status: None   Collection Time: 06/25/21  3:27 PM   Specimen: Nasopharyngeal Swab; Nasopharyngeal(NP) swabs in vial transport medium  Result Value Ref Range Status   SARS Coronavirus 2 by RT PCR NEGATIVE NEGATIVE Final    Comment: (NOTE) SARS-CoV-2 target nucleic acids are NOT DETECTED.  The SARS-CoV-2 RNA is generally detectable in upper respiratory specimens during the acute phase of infection. The lowest concentration of SARS-CoV-2 viral copies this assay can detect is 138 copies/mL. A negative result does not preclude SARS-Cov-2 infection and should not be used as the sole basis for treatment or other patient management decisions. A negative result may occur with  improper specimen collection/handling, submission of specimen other than nasopharyngeal swab, presence of  viral mutation(s) within the areas targeted by this assay, and inadequate number of viral copies(<138 copies/mL). A negative result must be combined with clinical observations, patient history, and epidemiological information. The expected result is Negative.  Fact Sheet for Patients:  EntrepreneurPulse.com.au  Fact Sheet for Healthcare Providers:  IncredibleEmployment.be  This test is no t yet approved or cleared by the [place] FDA and  has been authorized for detection and/or diagnosis of SARS-CoV-2 by FDA under an Emergency Use Authorization (EUA). This EUA will remain  in effect (meaning this test can be used) for the duration of the COVID-19 declaration under Section 564(b)(1) of the Act, 21 U.S.C.section 360bbb-3(b)(1), unless the authorization is terminated  or revoked sooner.       Influenza A by PCR NEGATIVE NEGATIVE Final   Influenza B by PCR NEGATIVE NEGATIVE Final    Comment: (NOTE) The Xpert Xpress SARS-CoV-2/FLU/RSV plus assay is intended as an aid in the diagnosis of influenza from Nasopharyngeal swab specimens and should not be used as a sole basis for treatment. Nasal washings and aspirates are unacceptable for Xpert Xpress SARS-CoV-2/FLU/RSV testing.  Fact Sheet for Patients: EntrepreneurPulse.com.au  Fact Sheet for Healthcare Providers: IncredibleEmployment.be  This test is not yet approved or cleared by the [place] FDA and has been authorized for detection and/or diagnosis of SARS-CoV-2 by FDA under an Emergency Use Authorization (EUA). This EUA will remain in effect (meaning this test can be used) for the duration of the COVID-19 declaration under Section 564(b)(1) of the Act, 21 U.S.C. section 360bbb-3(b)(1), unless the authorization is terminated or revoked.  Performed at [facility], [place], [place] [address]   Respiratory (~20  pathogens) panel by PCR     Status: None   Collection Time: 06/25/21  3:27 PM   Specimen: Nasopharyngeal Swab; Respiratory  Result Value Ref Range Status   Adenovirus NOT DETECTED NOT DETECTED Final   Coronavirus 229E NOT DETECTED NOT DETECTED Final    Comment: (NOTE) The Coronavirus on the Respiratory Panel, DOES NOT test for the novel  Coronavirus (2019 nCoV)    Coronavirus HKU1 NOT DETECTED NOT DETECTED Final   Coronavirus NL63 NOT DETECTED NOT DETECTED Final   Coronavirus OC43 NOT DETECTED NOT DETECTED Final   Metapneumovirus NOT DETECTED NOT DETECTED Final   Rhinovirus / Enterovirus NOT DETECTED NOT DETECTED Final   Influenza A NOT DETECTED NOT DETECTED Final   Influenza B NOT DETECTED NOT DETECTED Final   Parainfluenza Virus 1 NOT DETECTED NOT DETECTED Final   Parainfluenza Virus 2 NOT DETECTED NOT DETECTED Final   Parainfluenza Virus 3 NOT DETECTED NOT DETECTED Final   Parainfluenza Virus 4 NOT DETECTED NOT DETECTED Final   Respiratory Syncytial Virus NOT DETECTED NOT DETECTED Final   Bordetella  pertussis NOT DETECTED NOT DETECTED Final   Bordetella Parapertussis NOT DETECTED NOT DETECTED Final   Chlamydophila pneumoniae NOT DETECTED NOT DETECTED Final   Mycoplasma pneumoniae NOT DETECTED NOT DETECTED Final    Comment: Performed at [facility], [place] [address] [place], [place] [address]         Impression:   1)  Acute kidney injury with hyponatremia and hyperkalemia with CKD stage IV. \AKI secondary to ATN. Baseline creatinine appears to be 2.4 at time of admission Patient has CKD most likely secondary to hypertension Possible contribution from age associated decline Possible contribution from history of multiple AKI's  Will continue to monitor renal function. Avoid nephrotoxic agents and therapies.   Hyponatremic most likely secondary to hypervolemia. Improved with Lasix. Continue to encourage oral intake  Receiving lokelma for elevated potassium, will  transition to [facility] for improved efficacy.   2)HTN  Home regimen includes amlodipine, metoprolol and torsemide. Toresemide held, but Furosemide 20mg  IV ordered daily.  3)Anemia of chronic disease  CBC Latest Ref Rng & Units 07/01/2021 06/30/2021 06/29/2021  WBC 4.0 - 10.5 K/uL 13.4(H) 11.4(H) 10.5  Hemoglobin 13.0 - 17.0 g/dL 13.4 13.3 13.4  Hematocrit 39.0 - 52.0 % 40.8 40.2 40.6  Platelets 150 - 400 K/uL 194 192 198    Hgb at goal  4) Renal failure associated hyperphosphatemia Patient has history of renal failure hyperphosphatemia with phosphorus being high at 5  Lab Results  Component Value Date   CALCIUM 9.0 07/01/2021   Calcium levels at goal.     Colon Flattery 07/01/2021, 12:46 PM

## 2021-07-02 ENCOUNTER — Inpatient Hospital Stay: Payer: Medicare HMO

## 2021-07-02 DIAGNOSIS — I509 Heart failure, unspecified: Secondary | ICD-10-CM | POA: Diagnosis not present

## 2021-07-02 DIAGNOSIS — N179 Acute kidney failure, unspecified: Secondary | ICD-10-CM | POA: Diagnosis not present

## 2021-07-02 DIAGNOSIS — G9341 Metabolic encephalopathy: Secondary | ICD-10-CM | POA: Diagnosis not present

## 2021-07-02 DIAGNOSIS — Z7189 Other specified counseling: Secondary | ICD-10-CM

## 2021-07-02 DIAGNOSIS — R531 Weakness: Secondary | ICD-10-CM | POA: Diagnosis not present

## 2021-07-02 DIAGNOSIS — Z66 Do not resuscitate: Secondary | ICD-10-CM

## 2021-07-02 LAB — RENAL FUNCTION PANEL
Albumin: 3.2 g/dL — ABNORMAL LOW (ref 3.5–5.0)
Anion gap: 11 (ref 5–15)
BUN: 70 mg/dL — ABNORMAL HIGH (ref 8–23)
CO2: 27 mmol/L (ref 22–32)
Calcium: 9.4 mg/dL (ref 8.9–10.3)
Chloride: 87 mmol/L — ABNORMAL LOW (ref 98–111)
Creatinine, Ser: 2.98 mg/dL — ABNORMAL HIGH (ref 0.61–1.24)
GFR, Estimated: 20 mL/min — ABNORMAL LOW (ref >60)
Glucose, Bld: 150 mg/dL — ABNORMAL HIGH (ref 70–99)
Phosphorus: 5.1 mg/dL — ABNORMAL HIGH (ref 2.5–4.6)
Potassium: 5.8 mmol/L — ABNORMAL HIGH (ref 3.5–5.1)
Sodium: 125 mmol/L — ABNORMAL LOW (ref 135–145)

## 2021-07-02 LAB — GLUCOSE, CAPILLARY
Glucose-Capillary: 155 mg/dL — ABNORMAL HIGH (ref 70–99)
Glucose-Capillary: 122 mg/dL — ABNORMAL HIGH (ref 70–99)
Glucose-Capillary: 133 mg/dL — ABNORMAL HIGH (ref 70–99)
Glucose-Capillary: 154 mg/dL — ABNORMAL HIGH (ref 70–99)

## 2021-07-02 LAB — HEPATIC FUNCTION PANEL
ALT: 31 U/L (ref 0–44)
AST: 33 U/L (ref 15–41)
Albumin: 3.2 g/dL — ABNORMAL LOW (ref 3.5–5.0)
Alkaline Phosphatase: 35 U/L — ABNORMAL LOW (ref 38–126)
Bilirubin, Direct: 0.2 mg/dL (ref 0.0–0.2)
Indirect Bilirubin: 0.4 mg/dL (ref 0.3–0.9)
Total Bilirubin: 0.6 mg/dL (ref 0.3–1.2)
Total Protein: 6.8 g/dL (ref 6.5–8.1)

## 2021-07-02 LAB — CBC
HCT: 43.3 % (ref 39.0–52.0)
Hemoglobin: 14.2 g/dL (ref 13.0–17.0)
MCH: 28.4 pg (ref 26.0–34.0)
MCHC: 32.8 g/dL (ref 30.0–36.0)
MCV: 86.6 fL (ref 80.0–100.0)
Platelets: 190 10*3/uL (ref 150–400)
RBC: 5 MIL/uL (ref 4.22–5.81)
RDW: 13.9 % (ref 11.5–15.5)
WBC: 15.7 10*3/uL — ABNORMAL HIGH (ref 4.0–10.5)
nRBC: 0 % (ref 0.0–0.2)

## 2021-07-02 LAB — SODIUM: Sodium: 125 mmol/L — ABNORMAL LOW (ref 135–145)

## 2021-07-02 LAB — PROCALCITONIN: Procalcitonin: 0.15 ng/mL

## 2021-07-02 MED ORDER — SODIUM CHLORIDE 0.9 % IV SOLN: INTRAVENOUS | Status: AC

## 2021-07-02 MED ORDER — TOLVAPTAN 15 MG PO TABS
15.0000 mg | ORAL_TABLET | Freq: Once | ORAL | Status: AC
  Administered 2021-07-02: 11:00:00 15 mg via ORAL
  Filled 2021-07-02: qty 1

## 2021-07-02 NOTE — Progress Notes (Signed)
Occupational Therapy Treatment Patient Details Name: Robert Conrad MRN: [id number] DOB: 05-Jul-1940 Today's Date: 07/02/2021   History of present illness Pt is an 81 y/o M with PMH: DM, HTN, PAD, AAA, and renal atery stenosis who presents to ED via EMS d/t SOB and AMS. CT of head and UA were both negative. W/u revealed hyponatrmia and AKI with elevated creatinine. CXR suggestive of CHF and pt appears to be fluid overloaded per attending's note.   OT comments  Robert Conrad was seen for OT treatment on this date. Upon arrival to room pt in bed with family feeding pt, family eager for mobility attempts. Pt inconsistently answers questions however makes comments throughout repeating phrases or saying "oh yeah" to grooming. MAX A sup<>sit, tolerates ~5 min static sitting EOB. MAX A x2 grooming seated EOB - OT assists balance while family completes grooming task. Pt unable to follow commands to participate in further seated activity. Pt making good progress toward goals. Pt continues to benefit from skilled OT services to maximize return to PLOF and minimize risk of future falls, injury, caregiver burden, and readmission. Will continue to follow POC. Discharge recommendation remains appropriate.     Recommendations for follow up therapy are one component of a multi-disciplinary discharge planning process, led by the attending physician.  Recommendations may be updated based on patient status, additional functional criteria and insurance authorization.    Follow Up Recommendations  Skilled nursing-short term rehab (<3 hours/day)    Assistance Recommended at Discharge Frequent or constant Supervision/Assistance  Patient can return home with the following  Two people to help with walking and/or transfers;A lot of help with bathing/dressing/bathroom   Equipment Recommendations  None recommended by OT    Recommendations for Other Services      Precautions / Restrictions Precautions Precautions:  Fall Restrictions Weight Bearing Restrictions: No       Mobility Bed Mobility Overal bed mobility: Needs Assistance Bed Mobility: Supine to Sit, Sit to Supine     Supine to sit: Max assist Sit to supine: Max assist        Transfers                   General transfer comment: not safe to attempt 2/2 poor sitting balance     Balance Overall balance assessment: Needs assistance Sitting-balance support: Bilateral upper extremity supported, Feet supported Sitting balance-Leahy Scale: Poor                                     ADL either performed or assessed with clinical judgement   ADL Overall ADL's : Needs assistance/impaired                                       General ADL Comments: MAX A self-feeding at bed level. MAX A x2 grooming seated EOB - OT assists balance while family completes grooming task. Pt unable to follow commands to participate in further seated activity.      Cognition Arousal/Alertness: Awake/alert, Lethargic Behavior During Therapy: Flat affect Overall Cognitive Status: History of cognitive impairments - at baseline                                 General Comments: minimally verbal, repeats words back to  therapist/family.                   Pertinent Vitals/ Pain       Pain Assessment Pain Assessment: No/denies pain         Frequency  Min 2X/week        Progress Toward Goals  OT Goals(current goals can now be found in the care plan section)  Progress towards OT goals: Progressing toward goals  Acute Rehab OT Goals Patient Stated Goal: to feel better OT Goal Formulation: With family Time For Goal Achievement: 07/10/21 Potential to Achieve Goals: Fair ADL Goals Pt Will Perform Lower Body Dressing: with min guard assist;with min assist Pt Will Transfer to Toilet: with min guard assist;with min assist Pt Will Perform Toileting - Clothing Manipulation and hygiene: with min  guard assist;with min assist  Plan Discharge plan remains appropriate;Frequency remains appropriate    Co-evaluation                 AM-PAC OT "6 Clicks" Daily Activity     Outcome Measure   Help from another person eating meals?: None Help from another person taking care of personal grooming?: A Little Help from another person toileting, which includes using toliet, bedpan, or urinal?: A Lot Help from another person bathing (including washing, rinsing, drying)?: A Lot Help from another person to put on and taking off regular upper body clothing?: A Little Help from another person to put on and taking off regular lower body clothing?: A Lot 6 Click Score: 16    End of Session    OT Visit Diagnosis: Unsteadiness on feet (R26.81);Muscle weakness (generalized) (M62.81);Other symptoms and signs involving cognitive function   Activity Tolerance Patient tolerated treatment well   Patient Left in bed;with call bell/phone within reach;with bed alarm set;with family/visitor present   Nurse Communication Mobility status        Time: 2505-3976 OT Time Calculation (min): 14 min  Charges: OT General Charges $OT Visit: 1 Visit OT Treatments $Self Care/Home Management : 8-22 mins  Robert Conrad, M.S. OTR/L  07/02/21, 10:44 AM  ascom [phone]

## 2021-07-02 NOTE — Progress Notes (Signed)
[facility]  ROUNDING NOTE   Subjective:   Patient sitting at side of bed Somnolent  Currently working with PT No lower extremity edema  Sodium 125 Potassium 5.8 Creatinine 2.98 Urine output 2.475L in 24 hours  Objective:  Vital signs in last 24 hours:  Temp:  [97.3 F (36.3 C)-99.5 F (37.5 C)] 98.1 F (36.7 C) (02/28 0846) Pulse Rate:  [65-82] 70 (02/28 0846) Resp:  [16-22] 18 (02/28 0846) BP: (113-161)/(50-78) 113/68 (02/28 0846) SpO2:  [91 %-99 %] 96 % (02/28 0846) Weight:  [86.6 kg] 86.6 kg (02/28 0500)  Weight change: -2.9 kg Filed Weights   06/30/21 0500 07/01/21 0500 07/02/21 0500  Weight: 89 kg 89.5 kg 86.6 kg    Intake/Output: I/O last 3 completed shifts: In: 240 [P.O.:240] Out: 3275 [Urine:3275]   Intake/Output this shift:  No intake/output data recorded.  Physical Exam: General: NAD, confused  Head: Normocephalic, atraumatic. Moist oral mucosal membranes  Eyes: Anicteric  Lungs:  Clear to auscultation, normal effort  Heart: Regular rate and rhythm  Abdomen:  Soft, nontender  Extremities:  no peripheral edema.  Neurologic: Nonfocal, moving all four extremities  Skin: No lesions       Basic Metabolic Panel: Recent Labs  Lab 06/25/21 1723 06/26/21 0437 06/28/21 0500 06/29/21 0420 06/30/21 0415 07/01/21 0343 07/02/21 0434  NA  --    < > 129* 128* 126* 127* 125*  K  --    < > 4.6 4.8 5.2* 5.7* 5.8*  CL  --    < > 91* 92* 91* 89* 87*  CO2  --    < > 27 26 25 23 27   GLUCOSE  --    < > 108* 114* 116* 104* 150*  BUN  --    < > 46* 56* 61* 63* 70*  CREATININE  --    < > 2.56* 2.48* 2.54* 2.81* 2.98*  CALCIUM  --    < > 8.8* 9.1 8.9 9.0 9.4  MG 1.7  --   --   --   --   --   --   PHOS  --   --   --   --   --  6.2* 5.1*   < > = values in this interval not displayed.    Liver Function Tests: Recent Labs  Lab 06/26/21 0437 06/27/21 0413 06/28/21 0500 07/01/21 0343 07/02/21 0434  AST 14* 18 22  --   --   ALT 12 14 18   --    --   ALKPHOS 30* 32* 36*  --   --   BILITOT 0.7 0.8 0.8  --   --   PROT 6.2* 6.4* 6.3*  --   --   ALBUMIN 2.9* 3.0* 2.8* 3.1* 3.2*   No results for input(s): LIPASE, AMYLASE in the last 168 hours. Recent Labs  Lab 06/25/21 1851  AMMONIA 24    CBC: Recent Labs  Lab 06/25/21 1515 06/25/21 2118 06/28/21 0500 06/29/21 0419 06/30/21 0415 07/01/21 0343 07/02/21 0434  WBC 7.6   < > 9.1 10.5 11.4* 13.4* 15.7*  NEUTROABS 5.3  --   --   --   --   --   --   HGB 12.0*   < > 12.5* 13.4 13.3 13.4 14.2  HCT 37.5*   < > 37.1* 40.6 40.2 40.8 43.3  MCV 89.9   < > 86.9 87.5 86.8 88.3 86.6  PLT 153   < > 168 198 192 194 190   < > =  values in this interval not displayed.    Cardiac Enzymes: No results for input(s): CKTOTAL, CKMB, CKMBINDEX, TROPONINI in the last 168 hours.  BNP: Invalid input(s): POCBNP  CBG: Recent Labs  Lab 07/01/21 0809 07/01/21 1240 07/01/21 1630 07/01/21 2152 07/02/21 0737  GLUCAP 113* 140* 162* 169* 155*    Microbiology: Results for orders placed or performed during the hospital encounter of 06/25/21  Resp Panel by RT-PCR (Flu A&B, Covid) Nasopharyngeal Swab     Status: None   Collection Time: 06/25/21  3:27 PM   Specimen: Nasopharyngeal Swab; Nasopharyngeal(NP) swabs in vial transport medium  Result Value Ref Range Status   SARS Coronavirus 2 by RT PCR NEGATIVE NEGATIVE Final    Comment: (NOTE) SARS-CoV-2 target nucleic acids are NOT DETECTED.  The SARS-CoV-2 RNA is generally detectable in upper respiratory specimens during the acute phase of infection. The lowest concentration of SARS-CoV-2 viral copies this assay can detect is 138 copies/mL. A negative result does not preclude SARS-Cov-2 infection and should not be used as the sole basis for treatment or other patient management decisions. A negative result may occur with  improper specimen collection/handling, submission of specimen other than nasopharyngeal swab, presence of viral mutation(s)  within the areas targeted by this assay, and inadequate number of viral copies(<138 copies/mL). A negative result must be combined with clinical observations, patient history, and epidemiological information. The expected result is Negative.  Fact Sheet for Patients:  EntrepreneurPulse.com.au  Fact Sheet for Healthcare Providers:  IncredibleEmployment.be  This test is no t yet approved or cleared by the [place] FDA and  has been authorized for detection and/or diagnosis of SARS-CoV-2 by FDA under an Emergency Use Authorization (EUA). This EUA will remain  in effect (meaning this test can be used) for the duration of the COVID-19 declaration under Section 564(b)(1) of the Act, 21 U.S.C.section 360bbb-3(b)(1), unless the authorization is terminated  or revoked sooner.       Influenza A by PCR NEGATIVE NEGATIVE Final   Influenza B by PCR NEGATIVE NEGATIVE Final    Comment: (NOTE) The Xpert Xpress SARS-CoV-2/FLU/RSV plus assay is intended as an aid in the diagnosis of influenza from Nasopharyngeal swab specimens and should not be used as a sole basis for treatment. Nasal washings and aspirates are unacceptable for Xpert Xpress SARS-CoV-2/FLU/RSV testing.  Fact Sheet for Patients: EntrepreneurPulse.com.au  Fact Sheet for Healthcare Providers: IncredibleEmployment.be  This test is not yet approved or cleared by the [place] FDA and has been authorized for detection and/or diagnosis of SARS-CoV-2 by FDA under an Emergency Use Authorization (EUA). This EUA will remain in effect (meaning this test can be used) for the duration of the COVID-19 declaration under Section 564(b)(1) of the Act, 21 U.S.C. section 360bbb-3(b)(1), unless the authorization is terminated or revoked.  Performed at [facility], [place], [place] [address]   Respiratory (~20 pathogens) panel by PCR      Status: None   Collection Time: 06/25/21  3:27 PM   Specimen: Nasopharyngeal Swab; Respiratory  Result Value Ref Range Status   Adenovirus NOT DETECTED NOT DETECTED Final   Coronavirus 229E NOT DETECTED NOT DETECTED Final    Comment: (NOTE) The Coronavirus on the Respiratory Panel, DOES NOT test for the novel  Coronavirus (2019 nCoV)    Coronavirus HKU1 NOT DETECTED NOT DETECTED Final   Coronavirus NL63 NOT DETECTED NOT DETECTED Final   Coronavirus OC43 NOT DETECTED NOT DETECTED Final   Metapneumovirus NOT DETECTED NOT DETECTED  Final   Rhinovirus / Enterovirus NOT DETECTED NOT DETECTED Final   Influenza A NOT DETECTED NOT DETECTED Final   Influenza B NOT DETECTED NOT DETECTED Final   Parainfluenza Virus 1 NOT DETECTED NOT DETECTED Final   Parainfluenza Virus 2 NOT DETECTED NOT DETECTED Final   Parainfluenza Virus 3 NOT DETECTED NOT DETECTED Final   Parainfluenza Virus 4 NOT DETECTED NOT DETECTED Final   Respiratory Syncytial Virus NOT DETECTED NOT DETECTED Final   Bordetella pertussis NOT DETECTED NOT DETECTED Final   Bordetella Parapertussis NOT DETECTED NOT DETECTED Final   Chlamydophila pneumoniae NOT DETECTED NOT DETECTED Final   Mycoplasma pneumoniae NOT DETECTED NOT DETECTED Final    Comment: Performed at [facility], [place] [address] [place], [place] [address]    Coagulation Studies: No results for input(s): LABPROT, INR in the last 72 hours.  Urinalysis: No results for input(s): COLORURINE, LABSPEC, PHURINE, GLUCOSEU, HGBUR, BILIRUBINUR, KETONESUR, PROTEINUR, UROBILINOGEN, NITRITE, LEUKOCYTESUR in the last 72 hours.  Invalid input(s): APPERANCEUR    Imaging: US RENAL  Result Date: 06/30/2021 CLINICAL DATA:  Renal dysfunction EXAM: RENAL / URINARY TRACT ULTRASOUND COMPLETE COMPARISON:  10/29/2020 FINDINGS: Right Kidney: Renal measurements: 10.8 x 5.6 x 4.7 cm = volume: 149.6 mL. There is increased cortical echogenicity. There is 1 cm anechoic structure in the  lower pole suggesting renal cyst. Left Kidney: Renal measurements: 8.9 x 4.4 x 2.4 cm = volume: 49.8 mL. There is no hydronephrosis. There is slightly increased cortical echogenicity. There is possible 4 mm echogenic focus in the lower pole of left kidney. In the CT done on 11/24/2020, there was no definite demonstrable renal stone. Scattered calcifications were noted in the renal artery branches in both kidneys in the previous CT. Bladder: Unremarkable Other: Prostate measures 5 x 4.8 x 4.9 cm. IMPRESSION: There is no hydronephrosis. There is slightly increased cortical echogenicity in the kidneys. There is possible 1 cm cyst in the lower pole of right kidney. There is 4 mm echogenic focus in the lower pole of left kidney which may suggest vascular calcification or nonobstructing renal stone. Electronically Signed   By: Elmer Picker M.D.   On: 06/30/2021 15:53   DG Chest Port 1 View  Result Date: 07/02/2021 CLINICAL DATA:  Hypoxia. EXAM: PORTABLE CHEST 1 VIEW COMPARISON:  One-view chest x-ray 06/30/2021. FINDINGS: Heart is enlarged. Lung volumes are low. Mild bibasilar airspace opacities are similar the prior exam. IMPRESSION: 1. Stable cardiomegaly without failure. 2. Low lung volumes with mild bibasilar airspace disease, likely atelectasis. Electronically Signed   By: San Morelle M.D.   On: 07/02/2021 09:43   DG Chest Port 1 View  Result Date: 06/30/2021 CLINICAL DATA:  Cough, hypoxia EXAM: PORTABLE CHEST 1 VIEW COMPARISON:  Previous studies including the examination of 06/25/2021 FINDINGS: Transverse diameter of heart is increased. There is poor inspiration. There are no signs of alveolar pulmonary edema. There is prominence of interstitial markings in the right parahilar region and both lower lung fields. There is worsening of aeration in the lower lung fields. There is minimal blunting of right lateral CP angle. There is no pneumothorax. IMPRESSION: There is prominence of interstitial  markings in the right parahilar region and both lower lung fields with interval worsening suggesting mild interstitial edema or interstitial pneumonia. Possible minimal right pleural effusion. Electronically Signed   By: Elmer Picker M.D.   On: 06/30/2021 11:36     Medications:    sodium chloride 75 mL/hr at 07/02/21 8676  amLODipine  5 mg Oral Daily   aspirin EC  81 mg Oral Daily   divalproex  500 mg Oral BID   furosemide  20 mg Intravenous Daily   heparin  5,000 Units Subcutaneous Q8H   insulin aspart  0-9 Units Subcutaneous TID WC   insulin glargine-yfgn  5 Units Subcutaneous Daily   metoprolol succinate  25 mg Oral BID   pantoprazole  40 mg Oral Daily   patiromer  16.8 g Oral Daily   polyethylene glycol  17 g Oral Daily   risperiDONE  0.5 mg Oral QHS   simvastatin  20 mg Oral Daily   tamsulosin  0.4 mg Oral Daily   tolvaptan  15 mg Oral Once   acetaminophen, albuterol, bisacodyl, guaiFENesin, hydrALAZINE, metoprolol tartrate, ondansetron **OR** ondansetron (ZOFRAN) IV, senna-docusate, traMADol, traZODone  Assessment/ Plan:  [name] is a 81 y.o.  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  Patient is followed by Dr. Candiss Norse outpatient.   Acute kidney injury with hyponatremia and hyperkalemia with CKD stage IV. \AKI secondary to ATN. Baseline creatinine appears to be 2.4 at time of admission Patient has CKD most likely secondary to hypertension Possible contribution from age associated decline Possible contribution from history of multiple AKI's             Creatinine continues to rise. Sodium and potassium out of range. Will order one time dose of tolvaptan to manage sodium. Veltassa ordered daily for hyperkalmia. Recommending holding Lasix. Will continue to  monitor            Will order CT head wo contrast and ABG to evaluate somnolence.   Lab Results  Component Value Date   CREATININE 2.98 (H) 07/02/2021   CREATININE 2.81 (H) 07/01/2021   CREATININE 2.54 (H) 06/30/2021    Intake/Output Summary (Last 24 hours) at 07/02/2021 1033 Last data filed at 07/02/2021 0540 Gross per 24 hour  Intake 240 ml  Output 2175 ml  Net -1935 ml   2)HTN   Home regimen includes amlodipine, metoprolol and torsemide. Torsemide held.   3)Anemia of chronic disease Hgb at goal  4) Renal failure associated hyperphosphatemia Patient has history of renal failure hyperphosphatemia with phosphorus being high at 5    LOS: 7 [facility] 2/28/202310:33 AM

## 2021-07-02 NOTE — Assessment & Plan Note (Signed)
Hyponatremia labs with lower normal serum and urine osmolality and urinary sodium of 40, more consistent with hypervolemic hyponatremia. Sodium continued to get worse, at 125 today. -Nephrology is going to give him tolvaptan. -Monitor sodium

## 2021-07-02 NOTE — Progress Notes (Signed)
[facility]) Hospital Liaison Note   Received request from Transitions of Care Manager, [place]. However, family has requested no contact from [facility] staff until CT scan results are discussed with nephrologist. [facility] to contact family 3.1 and confirm interest/proceed with services.   Please call with any questions/concerns.    Thank you for the opportunity to participate in this patient's care.   Daphene Calamity, MSW [facility] Liaison  [phone]

## 2021-07-02 NOTE — Assessment & Plan Note (Signed)
Becoming confused intermittently. No focal neurodeficit.  Initial CT head is negative.  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  UA-negative.  B12 at lower normal limit of 202, ammonia and TSH within normal limit, valproic acid levels low at 28.  Mild persistent hyponatremia which can be contributory with underlying advanced dementia. -Repeat CT head was ordered by nephrology due to increased somnolence. -ABG -Continue to monitor

## 2021-07-02 NOTE — Progress Notes (Signed)
Tolvaptan education sheet exitcare given to pt and family

## 2021-07-02 NOTE — Progress Notes (Signed)
Palliative Care Progress Note, Assessment & Plan   Patient Name: Robert Conrad       Date: 07/02/2021 DOB: 09/16/1940  Age: 81 y.o. MRN#: [id number] Attending Physician: Lorella Nimrod, MD Primary Care Physician: Albina Billet, MD Admit Date: 06/25/2021  Reason for Consultation/Follow-up: Establishing goals of care  Subjective: Patient is lying flat in bed receiving a change of gown from his private caregiver and patient care tech.  Wife is at bedside.  Patient denies pain.  He is able to acknowledge my presence but it is unclear whether or not he is able to make his needs known.  HPI:  81 y.o. 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  During course of hospitalization, acute encephalopathy has not improved.  Kidney function has continued to decline.   Summary of counseling/coordination of care: After reviewing the patient's chart, I was asked to meet with the family again to discuss disposition.  After visiting with the patient, the patient's wife and I stepped out and spoke privately.  She shared she knows the patient is not getting better.  She states that he is becoming weaker and does not see the need for rehab at this time.  Discussed importance of keeping patient's wishes and what would be important to him at center of decision making.  Wife shares patient would want to go home and not want to go to a nursing facility.  Aggressive medical care versus comfort measures discussed in detail.  I asked if the patient would never want hemodialysis and she said no.   Patient's wife has decided to forego  SNF and have patient go home with hospice.  In various ways I made it clear that going home with hospice means no more aggressive medical interventions, no escalation of care, and no further work-up or treatment.  She was in agreement and said she would like to bring him home and keep him comfortable.  Social work Geophysical data processor and attending physician Dr. Reesa Chew made aware. Hospice choices to be offered.  Questions and concerns were addressed.  Code Status: DNR  Prognosis: < 6 months  Discharge Planning: Home with Hospice  Recommendations/Plan: Wife would like to awaiting results of CT scan before moving forward with hospice liaison discussions DNR remains  Care plan was discussed with patient's wife, [facility] [place], Dr. Reesa Chew  Physical Exam Vitals and nursing note reviewed.  Constitutional:      General: He is not in acute distress.    Appearance: He is ill-appearing.  HENT:     Head: Normocephalic and atraumatic.  Cardiovascular:     Rate and Rhythm: Normal rate.  Pulmonary:     Effort: Pulmonary effort is normal.  Abdominal:     Palpations: Abdomen is soft.  Musculoskeletal:     Comments: Generalized weakness  Skin:    General: Skin is warm and dry.  Psychiatric:        Mood and Affect: Mood is not anxious.        Behavior: Behavior  normal. Behavior is not agitated.            Palliative Assessment/Data: 40%    Total Time 35 minutes  Greater than 50%  of this time was spent counseling and coordinating care related to the above assessment and plan.  Thank you for allowing the Palliative Medicine Team to assist in the care of this patient.  Benton Ilsa Iha, FNP-BC Palliative Medicine Team Team Phone # [phone]

## 2021-07-02 NOTE — Progress Notes (Signed)
Progress note:  After reviewing the patient's chart and assessing the patient at bedside, I met with patient's son Jenny Reichmann and patient's caregiver Jackelyn Poling at bedside.  Jenny Reichmann has been out of town during the patient's hospitalization.  Medical update given.  Discussed acute encephalopathy and how work-up has shown no known root cause.  Reviewed this may not be patient's new baseline.  Family still wishes to take patient to skilled nursing facility for short-term rehab.  Discussed importance of continuing discussions with patient and family regarding goals of care.  Outpatient palliative services reviewed and detailed, offered, and referral placed.  Discussed functional, nutritional, and cognitive status as indicators of prognosis.  Patient continues to have a healthy appetite when fed.  He does not appear agitated or anxious but is still confused.  He acknowledges my presence but makes nonsensical words. Patient is unable to move in bed independently.  Discussed need for increased strength building and PT/OT needs at SNF to address these.  Questions and concerns were addressed.  PMT contact info given and family made aware we will be available as needed.  Plan: -DNR remains -Continue to treat the treatable -DC to skilled nursing facility for short-term rehab when medically stable -Outpatient palliative services to follow  Curt Bears L. Ilsa Iha, FNP-BC Palliative Medicine Team Team Phone # [phone]  Greater than 50% of this time was spent counseling and coordinating care related to the above assessment and plan.

## 2021-07-02 NOTE — Assessment & Plan Note (Signed)
Baseline creatinine 1.7.  Admission creatinine 2.4>>2.24>>2.56>.2.48>>2.54>>2.81>>2.98, with some worsening of BUN at 70.  Good urinary output, total urinary output recorded 2475 -Hold diuresis -Give him some gentle IV fluid -Monitor renal function -Avoid nephrotoxins

## 2021-07-02 NOTE — Consult Note (Signed)
[place] for Tolvaptan monitoring Indication: Hyponatremia  Intake/Output from previous day: 02/27 0701 - 02/28 0700 In: 240 [P.O.:240] Out: 4680 [Urine:2475] Intake/Output from this shift: No intake/output data recorded.  Labs: Recent Labs    06/30/21 0415 07/01/21 0343 07/02/21 0434  WBC 11.4* 13.4* 15.7*  HGB 13.3 13.4 14.2  HCT 40.2 40.8 43.3  PLT 192 194 190  CREATININE 2.54* 2.81* 2.98*  PHOS  --  6.2* 5.1*  ALBUMIN  --  3.1* 3.2*   Estimated Creatinine Clearance: 20.5 mL/min (A) (by C-G formula based on SCr of 2.98 mg/dL (H)).    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    Arthritis    Bowel trouble 1999   Diabetes mellitus without complication ([place]) 3212   non insulin dependent   Diverticulitis    Eye problems 2010   GERD (gastroesophageal reflux disease) 2009   Gout 2009   Hearing loss    Heart disease 1990   Hyperlipidemia 2009   Hypertension 2009   Hypotension    Obesity, unspecified    Personal history of colonic polyps    Personal history of tobacco use, presenting hazards to health    Pneumonia of both lower lobes due to infectious organism    Renal insufficiency    Severe sepsis ([place]) 11/24/2020   Special screening for malignant neoplasms, colon    Stroke (cerebrum) ([place])    Stroke ([place]) 2009   Ulcer 2009   stomach    Medications:  Tolvaptan 15 mg x 1 on 2/28  Assessment: Patient is an 81 y/o M with medical history as above who is admitted with confusion and weakness / falls at home. Poor PO intake. Patient is on IV Lasix for pulmonary vascular congestion / peripheral edema. Renal function is tenuous and has worsened with diuresis.   Patient has been started on gentle fluids with NS at 75 cc/hr and continues on IV Lasix 20 mg daily  Goal of Therapy:  Increase Na 4-6 mmol/L in 24h  Plan:  --Will follow along with sodium levels  ordered q8h x 2 after administration of tolvaptan --Will notify provider if sodium level increases too rapidly for consideration of adding back free water  Benita Gutter 07/02/2021,10:03 AM

## 2021-07-02 NOTE — Progress Notes (Signed)
Progress Note   Patient: Robert Conrad EXH:[id number] DOB: 1940/05/29 DOA: 06/25/2021     7 DOS: the patient was seen and examined on 07/02/2021   Brief hospital course: Taken from H&P.  Adeel Guiffre Rylee is a 81 y.o. 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   Per family patient has overall been doing well since his previous UTI in July 2022.  But about 1 week ago his daughter had URI thereafter he started developing symptoms of URI, cough and congestion.  But over the last 48 hours he started having progressive weakness, confusion and tendency of falling.  Denies any fevers, chills or other complaints.  Per family typically eats a good meal but over the past few days he has had poor appetite.  He is also not been taking his torsemide routinely.  In ED he appears confused, without any focal neurodeficit.  Labs pertinent for AKI, creatinine of 2.42, baseline around 1.7, hyponatremia which is consistent with hypervolemic hyponatremia with borderline low osmolality of urine and serum, urinary sodium of 40, BNP at 538, mild hyperkalemia which has been resolved.  UA was negative.  Chest x-ray suggestive of pulmonary vascular congestion.  CT head was negative. COVID-19 and flu PCR negative.  Respiratory viral panel negative.  Procalcitonin at 0.16 which is inconclusive.  Ammonia, TSH and B12 within normal limits.  Folate pending.  Concern of CHF, echocardiogram pending.  He was started on IV Lasix as appears volume overload with pulmonary vascular congestion and lower extremity edema.  2/24: IV Lasix was discontinued today for concern of increasing creatinine.  Clinically appears euvolemic.  Remains lethargic with some persistent upper respiratory symptoms.  On room air. Family will consider rehab and outpatient palliative care.  2/25: Continue to have some cough, otherwise  stable.  [facility].  2/26: Patient seems little more lethargic when seen today.  Per daughter and wife at bedside patient needs 100% assistance with feeding.  Appetite remained poor.  Currently on 2 L of oxygen Worsening hyponatremia and renal function with mild hyperkalemia. Nephrology was consulted to assist with hyponatremia and worsening renal function. Lokelma was ordered for hyperkalemia. Nephrology ordered renal ultrasound. Repeat chest x-ray with worsening of pulmonary vascular congestion.  2/27: Mild worsening of renal function and hyperkalemia at 5.7.  Patient received Lokelma with no improvement.  Switched with [employer].  Nephrology also hold torsemide and started him on low-dose Lasix 20 mg IV daily. Some improvement in p.o. intake this morning per caregiver.  2/28: Patient with increased somnolence.  Otherwise seems comfortable.  Worsening renal function, hyponatremia and hyperkalemia.  CT head ordered-pending, ABG ordered. Nephrology is recommending tolvaptan today, continue with Veltassa and holding Lasix. Patient's insurance authorization will expire today but unable to discharge due to ongoing medical management and worsening.  Family is not ready for hospice yet.  Patient is high risk for deterioration and death.  Palliative care is on board.   Assessment and Plan: * Acute metabolic encephalopathy- (present on admission) Becoming confused intermittently. No focal neurodeficit.  Initial CT head is negative.  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  UA-negative.  B12 at lower normal limit of 202, ammonia and TSH within normal limit, valproic acid levels low at 28.  Mild persistent hyponatremia which can be contributory  with underlying advanced dementia. -Repeat CT head was ordered by nephrology due to increased somnolence. -ABG -Continue to  monitor   Acute bronchitis- (present on admission) Chest was clear today.  COVID-19, flu and respiratory viral panel negative. Procalcitonin  at 0.22 >.0.17>>0.15 No radiologic evidence of pneumonia. 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  Admission creatinine 2.4>>2.24>>2.56>.2.48>>2.54>>2.81>>2.98, with some worsening of BUN at 70.  Good urinary output, total urinary output recorded 2475 -Hold diuresis -Give him some gentle IV fluid -Monitor renal function -Avoid nephrotoxins  Acute on chronic diastolic CHF (congestive heart failure) (HCC) BNP was elevated on admission.  Patient with history of CAD.  Echocardiogram with normal EF but grade 1 diastolic dysfunction.  Initially appears volume up and responding well to IV diuresis.  Patient has positive history for exertional dyspnea and orthopnea.  Was sleeping on recliner for some time, unable to lay flat due to breathing difficulties.  All consistent with diastolic CHF.  We were holding diuretics for the past 2 days.  Few basal crackles today Repeat chest x-ray with worsening of pulmonary vascular congestion -Low-dose torsemide was switched with 20 mg IV Lasix today -Daily BMP and weight -Strict intake and output  Acute urinary retention- (present on admission) Apparently Foley was placed in ED for concern of urinary retention. Per wife he was voiding fine before.  She wants to keep catheter for comfort reasons as he does not have to get up that frequently. I explained to her that goal can be achieved with condom catheter.  Foley catheter was removed 2/23, voiding good. -Check postvoid residual volume due to worsening creatinine  Hyponatremia- (present on admission) Hyponatremia labs with lower normal serum and urine osmolality and urinary sodium of 40, more consistent with hypervolemic  hyponatremia. Sodium continued to get worse, at 125 today. -Nephrology is going to give him tolvaptan. -Monitor sodium   Hyperkalemia- (present on admission) Worsening potassium, at 5.8, patient was given Lokelma and Veltassa. Worsening renal function are contributory. -Continue with Veltassa -Monitor potassium  CAD (coronary artery disease), autologous vein bypass graft- (present on admission) Currently without any chest pain  -continue aspirin, beta-blocker and statin  DM2 (diabetes mellitus, type 2) ([place]) -Sliding scale and Accu-Cheks.  CBG currently within goal - Semglee 5 units daily.  - Holding off on home regimen.  Essential hypertension- (present on admission) Blood pressure within goal -Continue home Norvasc, metoprolol.  -On IV Lasix  Positive D dimer Nonspecific, low suspicion for PE.  Unable to get CTA due to his renal dysfunction.  Currently on room air. -Can do VQ scan if needed  PAD (peripheral artery disease) ([place])- (present on admission) -Continue aspirin and statin.  Hyperlipidemia- (present on admission) -Continue statin  Subjective: Patient was sleeping when seen today.  Per wife and caregiver who were at bedside, he was little more awake earlier in the day and did ate his breakfast.  He is spending most of his time sleeping.  Appears comfortable.  Physical Exam: Vitals:   07/01/21 2356 07/02/21 0415 07/02/21 0500 07/02/21 0846  BP: (!) 122/50 131/65  113/68  Pulse: 70 75  70  Resp: (!) 22 20  18   Temp: 98.5 F (36.9 C) 98.8 F (37.1 C)  98.1 F (36.7 C)  TempSrc: Oral Oral  Oral  SpO2: 93% 91%  96%  Weight:   86.6 kg   Height:  General.  Somnolent elderly man, in no acute distress. Pulmonary.  Lungs clear bilaterally, normal respiratory effort. CV.  Regular rate and rhythm, no JVD, rub or murmur. Abdomen.  Soft, nontender, nondistended, BS positive. CNS.  Somnolent, no apparent focal deficit Extremities.  No edema, no cyanosis, pulses  intact and symmetrical. Psychiatry.  Judgment and insight appears impaired.  Data Reviewed: Prior notes, labs and images reviewed  Family Communication: Discussed with wife and caregiver at bedside.  Daughter on phone.  Disposition: Status is: Inpatient Remains inpatient appropriate because: Severity of illness   Planned Discharge Destination: Skilled nursing facility, patient will lose his authorization today, continue to get worse but family is not ready for hospice yet.  DVT prophylaxis.  Subcu heparin  Time spent: 50 minutes  This record has been created using Systems analyst. Errors have been sought and corrected,but may not always be located. Such creation errors do not reflect on the standard of care.  Author: Lorella Nimrod, MD 07/02/2021 2:03 PM  For on call review [email or web address].

## 2021-07-02 NOTE — Assessment & Plan Note (Signed)
Worsening potassium, at 5.8, patient was given Lokelma and Veltassa. Worsening renal function are contributory. -Continue with Veltassa -Monitor potassium

## 2021-07-02 NOTE — TOC Progression Note (Addendum)
Transition of Care [facility]) - Progression Note    Patient Details  Name: Robert Conrad MRN: [id number] Date of Birth: Nov 27, 1940  Transition of [facility]) CM/SW [place], RN Phone Number: 07/02/2021, 11:13 AM  Clinical Narrative:   Patient unable to discharge at this time.  Family and providers are discussing hospice.  TOC to follow.  Addendum:  1348 hours.  Family undecided on disposition.  Spouse asks if someone at the hospital can make a decision for them.  RNCM explained that we can provide information about options, diagnosis, etc, and make appropriate consults, and the family is free to ask questions as they need.  CNA in room advises family to accept Palliative, as she states that is the best option.  RNCM explained that I cannot advise, just provide information.  Patient's caregiver states she would like to see Palliative care, wife in agreement.  Palliative care will see patient.  Expected Discharge Plan:  (TBD) Barriers to Discharge: Continued Medical Work up  Expected Discharge Plan and Services Expected Discharge Plan:  (TBD)   Discharge Planning Services: CM Consult   Living arrangements for the past 2 months: Single Family Home                                       Social Determinants of Health (SDOH) Interventions    Readmission Risk Interventions No flowsheet data found.

## 2021-07-02 NOTE — Consult Note (Signed)
Robert Conrad for Tolvaptan monitoring Indication: Hyponatremia  Intake/Output from previous day: 02/27 0701 - 02/28 0700 In: 240 [P.O.:240] Out: 0277 [Urine:2475] Intake/Output from this shift: No intake/output data recorded.  Labs: Recent Labs    06/30/21 0415 07/01/21 0343 07/02/21 0434  WBC 11.4* 13.4* 15.7*  HGB 13.3 13.4 14.2  HCT 40.2 40.8 43.3  PLT 192 194 190  CREATININE 2.54* 2.81* 2.98*  PHOS  --  6.2* 5.1*  ALBUMIN  --  3.1* 3.2*   3.2*  PROT  --   --  6.8  AST  --   --  33  ALT  --   --  31  ALKPHOS  --   --  35*  BILITOT  --   --  0.6  BILIDIR  --   --  0.2  IBILI  --   --  0.4    Estimated Creatinine Clearance: 20.5 mL/min (A) (by C-G formula based on SCr of 2.98 mg/dL (H)).    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    Arthritis    Bowel trouble 1999   Diabetes mellitus without complication ([place]) 4128   non insulin dependent   Diverticulitis    Eye problems 2010   GERD (gastroesophageal reflux disease) 2009   Gout 2009   Hearing loss    Heart disease 1990   Hyperlipidemia 2009   Hypertension 2009   Hypotension    Obesity, unspecified    Personal history of colonic polyps    Personal history of tobacco use, presenting hazards to health    Pneumonia of both lower lobes due to infectious organism    Renal insufficiency    Severe sepsis ([place]) 11/24/2020   Special screening for malignant neoplasms, colon    Stroke (cerebrum) ([place])    Stroke ([place]) 2009   Ulcer 2009   stomach    Medications:  Tolvaptan 15 mg x 1 on 2/28  Assessment: Patient is an 81 y/o M with medical history as above who is admitted with confusion and weakness / falls at home. Poor PO intake. Patient is on IV Lasix for pulmonary vascular congestion / peripheral edema. Renal function is tenuous and has worsened with diuresis.   Patient has been started on gentle fluids  with NS at 75 cc/hr and continues on IV Lasix 20 mg daily  Date/time Sodium lvl 2/28@0434  125(baseline level prior to tolvaptan) 2/28@1845  125  Goal of Therapy:  Increase Na 4-6 mmol/L in 24h  Plan:  --Will follow along with sodium levels ordered q8h x 2 after administration of tolvaptan --Will notify provider if sodium level increases too rapidly for consideration of adding back free water  [employer] 07/02/2021,7:41 PM

## 2021-07-02 NOTE — Progress Notes (Signed)
Physical Therapy Treatment Patient Details Name: Robert Conrad MRN: [id number] DOB: August 18, 1940 Today's Date: 07/02/2021   History of Present Illness Pt is an 81 y/o M with PMH: DM, HTN, PAD, AAA, and renal atery stenosis who presents to ED via EMS d/t SOB and AMS. CT of head and UA were both negative. W/u revealed hyponatrmia and AKI with elevated creatinine. CXR suggestive of CHF and pt appears to be fluid overloaded per attending's note.    PT Comments    Patient seen for PT treatment on this date. Upon entering room patient was resting with HOB slightly elevated with family CNA present. Patient inconsistently answers questions from CNA and therapist present. Patient required MAX A +2 to complete supine<>sitting bed mobility, and MAX +2 to scoot patient up in bed via chuck pad. Patient became more alert in short sitting, however continued to inconsistently follow 1 step commands. Sitting EOB patient was able to demonstrate ~10 seconds of static sitting at SUP, however required at least Min A the remainder of the time. Patient tolerated ~6 minutes sitting EOB. Attempted LAQs with patient. He was able to demonstrate ability to actively perform, however did not respond to tactile or verbal cueing to complete. Patient left in supine with family CNA present and all needs met. Patient would continue to benefit from skilled physical therapy in order to optimize patient's return to PLOF. Continue to recommend STR upon discharge from acute hospitalization.     Recommendations for follow up therapy are one component of a multi-disciplinary discharge planning process, led by the attending physician.  Recommendations may be updated based on patient status, additional functional criteria and insurance authorization.  Follow Up Recommendations  Skilled nursing-short term rehab (<3 hours/day)     Assistance Recommended at Discharge Frequent or constant Supervision/Assistance  Patient can return home with  the following Two people to help with walking and/or transfers;Two people to help with bathing/dressing/bathroom   Equipment Recommendations   (TBD)    Recommendations for Other Services       Precautions / Restrictions Precautions Precautions: Fall Restrictions Weight Bearing Restrictions: No     Mobility  Bed Mobility Overal bed mobility: Needs Assistance Bed Mobility: Supine to Sit, Sit to Supine     Supine to sit: Max assist, +2 for physical assistance Sit to supine: Max assist, +2 for physical assistance   General bed mobility comments: doesn't follow commands    Transfers                        Ambulation/Gait                   Stairs             Wheelchair Mobility    Modified Rankin (Stroke Patients Only)       Balance Overall balance assessment: Needs assistance Sitting-balance support: Bilateral upper extremity supported, Feet supported Sitting balance-Leahy Scale: Poor Sitting balance - Comments: requires at least MIN A majority of static sitting time, was able to demonstrate ~10 seconds of static sitting at SUP Postural control: Right lateral lean, Posterior lean                                  Cognition Arousal/Alertness: Awake/alert, Lethargic Behavior During Therapy: Flat affect Overall Cognitive Status: History of cognitive impairments - at baseline  General Comments: minimally verbal, occasionally answers to family aid        Exercises Other Exercises Other Exercises: patient was able to demonstrate ability to actively perform LAQ on RLE while sitting EOB, however was unable to follow commands to do so despite tactile and verbal cueing    General Comments        Pertinent Vitals/Pain Pain Assessment Pain Assessment: No/denies pain    Home Living                          Prior Function            PT Goals (current goals can now  be found in the care plan section) Acute Rehab PT Goals Patient Stated Goal: to get stronger PT Goal Formulation: With family Time For Goal Achievement: 07/10/21 Potential to Achieve Goals: Fair Additional Goals Additional Goal #1: Pt will be able to perform bed mobility/transfers with supervision and safe technique in order to improve functional independence Progress towards PT goals: Progressing toward goals    Frequency    Min 2X/week      PT Plan Current plan remains appropriate    Co-evaluation              AM-PAC PT "6 Clicks" Mobility   Outcome Measure  Help needed turning from your back to your side while in a flat bed without using bedrails?: Total Help needed moving from lying on your back to sitting on the side of a flat bed without using bedrails?: Total Help needed moving to and from a bed to a chair (including a wheelchair)?: Total Help needed standing up from a chair using your arms (e.g., wheelchair or bedside chair)?: Total Help needed to walk in hospital room?: Total Help needed climbing 3-5 steps with a railing? : Total 6 Click Score: 6    End of Session   Activity Tolerance: Other (comment) (limited by cognition) Patient left: in bed;with bed alarm set;with family/visitor present Nurse Communication: Mobility status PT Visit Diagnosis: Muscle weakness (generalized) (M62.81);Difficulty in walking, not elsewhere classified (R26.2);History of falling (Z91.81)     Time: 2979-8921 PT Time Calculation (min) (ACUTE ONLY): 11 min  Charges:  $Therapeutic Activity: 8-22 mins                     Iva Boop, PT  07/02/21. 3:00 PM

## 2021-07-02 NOTE — TOC Progression Note (Signed)
Transition of Care [facility]) - Progression Note    Patient Details  Name: Robert Conrad MRN: [id number] Date of Birth: 05-04-1941  Transition of Care [facility]) CM/SW [place], RN Phone Number: 07/02/2021, 3:09 PM  Clinical Narrative:   Spoke to wife as a follow up from Palliative Visit. Wife states she would like hospice at home.  Explained providers, wife states she would like [facility] to see patient tomorrow.  She would first like to speak with nephrologist about CT results.    Care team, including [place] liaison notified.  Michae Kava to see patient and family tomorrow.    Expected Discharge Plan:  (TBD) Barriers to Discharge: Continued Medical Work up  Expected Discharge Plan and Services Expected Discharge Plan:  (TBD)   Discharge Planning Services: CM Consult   Living arrangements for the past 2 months: Single Family Home                                       Social Determinants of Health (SDOH) Interventions    Readmission Risk Interventions No flowsheet data found.

## 2021-07-02 NOTE — Assessment & Plan Note (Signed)
Chest was clear today.  COVID-19, flu and respiratory viral panel negative. Procalcitonin  at 0.22 >.0.17>>0.15 No radiologic evidence of pneumonia. -Completed the course of Augmentin and Zithromax -Continue to monitor -Supportive care

## 2021-07-03 DIAGNOSIS — G9341 Metabolic encephalopathy: Secondary | ICD-10-CM | POA: Diagnosis not present

## 2021-07-03 DIAGNOSIS — I509 Heart failure, unspecified: Secondary | ICD-10-CM

## 2021-07-03 DIAGNOSIS — R531 Weakness: Secondary | ICD-10-CM | POA: Diagnosis not present

## 2021-07-03 DIAGNOSIS — I10 Essential (primary) hypertension: Secondary | ICD-10-CM

## 2021-07-03 DIAGNOSIS — E871 Hypo-osmolality and hyponatremia: Secondary | ICD-10-CM

## 2021-07-03 DIAGNOSIS — R0600 Dyspnea, unspecified: Secondary | ICD-10-CM

## 2021-07-03 DIAGNOSIS — E875 Hyperkalemia: Secondary | ICD-10-CM

## 2021-07-03 DIAGNOSIS — Z66 Do not resuscitate: Secondary | ICD-10-CM

## 2021-07-03 DIAGNOSIS — N179 Acute kidney failure, unspecified: Secondary | ICD-10-CM | POA: Diagnosis not present

## 2021-07-03 LAB — BLOOD GAS, ARTERIAL
Acid-Base Excess: 3.7 mmol/L — ABNORMAL HIGH (ref 0.0–2.0)
Bicarbonate: 29.7 mmol/L — ABNORMAL HIGH (ref 20.0–28.0)
O2 Content: 2 L/min
O2 Saturation: 96.9 %
Patient temperature: 37
pCO2 arterial: 49 mmHg — ABNORMAL HIGH (ref 32–48)
pH, Arterial: 7.39 (ref 7.35–7.45)
pO2, Arterial: 83 mmHg (ref 83–108)

## 2021-07-03 LAB — GLUCOSE, CAPILLARY
Glucose-Capillary: 153 mg/dL — ABNORMAL HIGH (ref 70–99)
Glucose-Capillary: 159 mg/dL — ABNORMAL HIGH (ref 70–99)
Glucose-Capillary: 159 mg/dL — ABNORMAL HIGH (ref 70–99)
Glucose-Capillary: 155 mg/dL — ABNORMAL HIGH (ref 70–99)

## 2021-07-03 LAB — PROCALCITONIN: Procalcitonin: 0.15 ng/mL

## 2021-07-03 LAB — SODIUM: Sodium: 124 mmol/L — ABNORMAL LOW (ref 135–145)

## 2021-07-03 NOTE — Progress Notes (Signed)
OT Cancellation Note ? ?Patient Details ?Name: Robert Conrad ?MRN: [id number] ?DOB: 1941-03-25 ? ? ?Cancelled Treatment:    Reason Eval/Treat Not Completed: Patient at procedure or test/ unavailable (Pt. family feeding pt. breakfast. Declined to have pt. attempt self-feeding secondary to weakness. Pt. family reports breakfast came late, and requesting to have therapy at another time.  Will continue to monitor, and tx at a later time, or date.) ? ?Harrel Carina, MS, OTR/L ?07/03/2021, 11:53 AM ?

## 2021-07-03 NOTE — Consult Note (Signed)
MEDICATION RELATED CONSULT NOTE ? ?Pharmacy Consult for Tolvaptan monitoring ?Indication: Hyponatremia ? ?Intake/Output from previous day: ?02/28 0701 - 03/01 0700 ?In: 437.1 [I.V.:437.1] ?Out: 0  ?Intake/Output from this shift: ?No intake/output data recorded. ? ?Labs: ?Recent Labs  ?  06/30/21 ?0415 07/01/21 ?0343 07/02/21 ?0434  ?WBC 11.4* 13.4* 15.7*  ?HGB 13.3 13.4 14.2  ?HCT 40.2 40.8 43.3  ?PLT 192 194 190  ?CREATININE 2.54* 2.81* 2.98*  ?PHOS  --  6.2* 5.1*  ?ALBUMIN  --  3.1* 3.2*  3.2*  ?PROT  --   --  6.8  ?AST  --   --  33  ?ALT  --   --  31  ?ALKPHOS  --   --  35*  ?BILITOT  --   --  0.6  ?BILIDIR  --   --  0.2  ?IBILI  --   --  0.4  ? ? ?Estimated Creatinine Clearance: 20.5 mL/min (A) (by C-G formula based on SCr of 2.98 mg/dL (H)). ? ? ? ?Medical History: ?Past Medical History:  ?Diagnosis Date  ? 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 Arthritis   ? Bowel trouble 1999  ? Diabetes mellitus without complication ([place]) 1027  ? non insulin dependent  ? Diverticulitis   ? Eye problems 2010  ? GERD (gastroesophageal reflux disease) 2009  ? Gout 2009  ? Hearing loss   ? Heart disease 1990  ? Hyperlipidemia 2009  ? Hypertension 2009  ? Hypotension   ? Obesity, unspecified   ? Personal history of colonic polyps   ? Personal history of tobacco use, presenting hazards to health   ? Pneumonia of both lower lobes due to infectious organism   ? Renal insufficiency   ? Severe sepsis ([place]) 11/24/2020  ? Special screening for malignant neoplasms, colon   ? Stroke (cerebrum) ([place])   ? Stroke [facility]) 2009  ? Ulcer 2009  ? stomach  ? ? ?Medications:  ?Tolvaptan 15 mg x 1 on 2/28 ? ?Assessment: ?Patient is an 81 y/o M with medical history as above who is admitted with confusion and weakness / falls at home. Poor PO intake. Patient is on IV Lasix for pulmonary vascular congestion / peripheral edema. Renal function is tenuous and has worsened with diuresis.  ? ?Patient has been started on gentle fluids with NS at 75  cc/hr and continues on IV Lasix 20 mg daily ? ?Date/time Sodium lvl ?2/28@0434  125(baseline level prior to tolvaptan) ?2/28@1845  125 ?3/01@0134      124 ? ?Goal of Therapy:  ?Increase Na 4-6 mmol/L in 24h ? ?Plan:  ? ?3/1:  Na @ 0134 = 124 ,  Na dropped 1 mEq/mL from previous reading.  Will message NP and MD to see if they want to give another dose of tolvaptan.  ? ?Joshia Kitchings D ?07/03/2021,2:23 AM ? ? ? ?

## 2021-07-03 NOTE — Progress Notes (Signed)
?  [facility]  ?ROUNDING NOTE  ? ?Subjective:  ? ?Sodium decreased ?Family has decided to proceed with hospice ? ?Assessment/ Plan:  ?[name] is a 81 y.o.  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  Patient is followed by Dr. Candiss Conrad outpatient. ?  Acute kidney injury with hyponatremia and hyperkalemia with CKD stage IV. \AKI secondary to ATN. Baseline creatinine appears to be 2.4 at time of admission ?Patient has CKD most likely secondary to hypertension ?Possible contribution from age associated decline ?Possible contribution from history of multiple AKI's ?             ? ?Lab Results  ?Component Value Date  ? CREATININE 2.98 (H) 07/02/2021  ? CREATININE 2.81 (H) 07/01/2021  ? CREATININE 2.54 (H) 06/30/2021  ? ? ?Intake/Output Summary (Last 24 hours) at 07/03/2021 1117 ?Last data filed at 07/03/2021 1015 ?Gross per 24 hour  ?Intake 1374.75 ml  ?Output 2100 ml  ?Net -725.25 ml  ? ? ?2)HTN ?  ?Home regimen includes amlodipine, metoprolol and torsemide. Torsemide held.  ? ?3)Anemia of chronic disease ?Hgb at goal ? ?4) Renal failure associated hyperphosphatemia ?Patient has history of renal failure hyperphosphatemia with phosphorus being high at 5 ? ? ?Decision has been made to proceed with hospice. We will sign off at this time.  ? ? LOS: 8 ?[place] ?3/1/202311:17 AM ?  ?

## 2021-07-03 NOTE — Progress Notes (Signed)
PROGRESS NOTE  Robert Conrad    DOB: 11-04-1940, 81 y.o.  IOE:[id number]    Code Status: DNR   DOA: 06/25/2021   LOS: 8   Brief hospital course  Robert Conrad is a 81 y.o. male with a PMH significant for CKD IIIb, AAA, BPH, HTN, CAD, type 2 DM, . They presented from home to the ED on 06/25/2021 with AMS, weakness x a couple days. This is following URI symptoms for several days. He is now having decreased responsiveness, poor PO intake, and several falls. In the ED, it was found that they had non-focal neurological deficits with AKI, hyponatremia. Head imaging was negative for stroke. Chest xray was positive for signs of CHF. Respiratory panel was negative.  They were treated with lasix for fluid overload and bronchodilators. Patient was admitted to medicine service for further workup and management of AMS as outlined in detail below.  Hyponatremia continued to gradually worsen despite treatment and he did not show signs of improvement clinically. Palliative was consulted and discussions of disposition with hospice began.   07/03/21 -family have decided to move forward with home hospice care.   Assessment & Plan  Principal Problem:   Acute metabolic encephalopathy Active Problems:   PAD (peripheral artery disease) (HCC)   DM2 (diabetes mellitus, type 2) (HCC)   Essential hypertension   Hyperlipidemia   CAD (coronary artery disease), autologous vein bypass graft   Hyperkalemia   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HCC)   Acute bronchitis   Hyponatremia   Positive D dimer   Acute on chronic diastolic CHF (congestive heart failure) ([place])   Acute urinary retention  Acute metabolic encephalopathy- (present on admission) Becoming confused intermittently. No focal neurodeficit.  Initial CT head is negative.  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  UA-negative.  B12 at lower normal limit of  202, ammonia and TSH within normal limit, valproic acid levels low at 28.  Mild persistent hyponatremia which can be contributory with underlying advanced dementia. -Continue to monitor    Acute bronchitis- (present on admission) -Completed the course of Augmentin and Zithromax -Continue to monitor -Supportive care   AKI on CKD IV   hyponatremia   Baseline creatinine 1.7.  Admission creatinine 2.4>>>2.98, with some worsening of BUN at 70.  Good urinary output. Na+ continued to worsen despite tolvaptan. Na+ 124 today. No acute clinical change -nephrology has signed off due to transition to hospice -Monitor renal function -Avoid nephrotoxins   Acute on chronic diastolic CHF (congestive heart failure) (HCC) BNP was elevated on admission.  Patient with history of CAD.  Echocardiogram with normal EF but grade 1 diastolic dysfunction.  Initially appears volume up and responding well to IV diuresis.  Patient has positive history for exertional dyspnea and orthopnea.  Was sleeping on recliner for some time, unable to lay flat due to breathing difficulties.  All consistent with diastolic CHF.   -Low-dose torsemide was switched with 20 mg IV Lasix  -Daily BMP and weight -Strict intake and output   Acute urinary retention- resolved   Hyperkalemia- (present on admission) Worsening potassium, at 5.8, patient was given Lokelma and Veltassa. Worsening renal function are contributory. -Continue with Veltassa -Monitor potassium   CAD ,autologous vein bypass graft- (present on admission) Currently without any chest pain  -continue aspirin, beta-blocker and statin   DM2 (diabetes mellitus, type 2) ([place]) -Sliding scale and Accu-Cheks.  CBG currently within  goal - Semglee 5 units daily.  - Holding off on home regimen.   Essential hypertension- (present on admission) Blood pressure within goal -Continue home Norvasc, metoprolol.    PAD (peripheral artery disease) ([place])- (present on  admission) -Continue aspirin and statin.   Hyperlipidemia- (present on admission) -Continue statin  Body mass index is 28.83 kg/m.  VTE ppx: heparin injection 5,000 Units Start: 06/25/21 1745  Diet:     Diet   Diet heart healthy/carb modified Room service appropriate? Yes; Fluid consistency: Thin   Subjective 07/03/21    Pt reports nothing today. He is not responsive to conversation. He is awake and in no apparent distress. Spent a considerable amount of time speaking with his family members at bedside and on phone to answer questions.    Objective   Vitals:   07/02/21 1622 07/02/21 2017 07/03/21 0308 07/03/21 0444  BP: (!) 167/72 126/64  131/63  Pulse: 64 71  65  Resp: 18 18  18   Temp: 97.8 F (36.6 C) 97.9 F (36.6 C)  98.4 F (36.9 C)  TempSrc: Oral Oral  Oral  SpO2: 99% 97%  97%  Weight:   86 kg   Height:        Intake/Output Summary (Last 24 hours) at 07/03/2021 0707 Last data filed at 07/03/2021 0445 Gross per 24 hour  Intake 1134.75 ml  Output 1100 ml  Net 34.75 ml   Filed Weights   07/01/21 0500 07/02/21 0500 07/03/21 0308  Weight: 89.5 kg 86.6 kg 86 kg     Physical Exam:  General: awake, alert, NAD HEENT: atraumatic, clear conjunctiva, anicteric sclera, MMM Respiratory: normal respiratory effort. CTAB Cardiovascular: quick capillary refill  Nervous: Alert. Does not follow commands or respond to conversation. Falls asleep if not actively engaged Extremities:  generalized edema Skin: dry, intact, normal temperature, normal color. Ecchymoses on forearms  Labs   I have personally reviewed the following labs and imaging studies CBC    Component Value Date/Time   WBC 15.7 (H) 07/02/2021 0434   RBC 5.00 07/02/2021 0434   HGB 14.2 07/02/2021 0434   HGB 15.4 08/31/2013 0743   HCT 43.3 07/02/2021 0434   HCT 39.4 06/25/2021 2118   PLT 190 07/02/2021 0434   PLT 182 08/31/2013 0743   MCV 86.6 07/02/2021 0434   MCV 87 08/31/2013 0743   MCH 28.4  07/02/2021 0434   MCHC 32.8 07/02/2021 0434   RDW 13.9 07/02/2021 0434   RDW 14.5 08/31/2013 0743   LYMPHSABS 1.1 06/25/2021 1515   MONOABS 1.1 (H) 06/25/2021 1515   EOSABS 0.1 06/25/2021 1515   BASOSABS 0.0 06/25/2021 1515   BMP Latest Ref Rng & Units 07/03/2021 07/02/2021 07/02/2021  Glucose 70 - 99 mg/dL - - 150(H)  BUN 8 - 23 mg/dL - - 70(H)  Creatinine 0.61 - 1.24 mg/dL - - 2.98(H)  Sodium 135 - 145 mmol/L 124(L) 125(L) 125(L)  Potassium 3.5 - 5.1 mmol/L - - 5.8(H)  Chloride 98 - 111 mmol/L - - 87(L)  CO2 22 - 32 mmol/L - - 27  Calcium 8.9 - 10.3 mg/dL - - 9.4    CT HEAD WO CONTRAST (5MM)  Result Date: 07/02/2021 CLINICAL DATA:  Altered mental status. EXAM: CT HEAD WITHOUT CONTRAST TECHNIQUE: Contiguous axial images were obtained from the base of the skull through the vertex without intravenous contrast. RADIATION DOSE REDUCTION: This exam was performed according to the departmental dose-optimization program which includes automated exposure control, adjustment of the mA and/or kV  according to patient size and/or use of iterative reconstruction technique. COMPARISON:  MRI brain and CT head dated June 25, 2021. FINDINGS: Brain: No evidence of acute infarction, hemorrhage, hydrocephalus, extra-axial collection or mass lesion/mass effect. 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 Stable atrophy and chronic microvascular ischemic changes. Vascular: Calcified atherosclerosis at the skull base. No hyperdense vessel. Skull: Normal. Negative for fracture or focal lesion. Sinuses/Orbits: No acute finding. Other: None. IMPRESSION: 1. No acute intracranial abnormality. 2. Stable atrophy, chronic microvascular ischemic changes, and old right MCA territory infarct. Electronically Signed   By: Titus Dubin M.D.   On: 07/02/2021 13:54   DG Chest Port 1 View  Result Date: 07/02/2021 CLINICAL DATA:  Hypoxia. EXAM: PORTABLE CHEST 1 VIEW COMPARISON:   One-view chest x-ray 06/30/2021. FINDINGS: Heart is enlarged. Lung volumes are low. Mild bibasilar airspace opacities are similar the prior exam. IMPRESSION: 1. Stable cardiomegaly without failure. 2. Low lung volumes with mild bibasilar airspace disease, likely atelectasis. Electronically Signed   By: San Morelle M.D.   On: 07/02/2021 09:43    Disposition Plan & Communication  Patient status: Inpatient  Admitted From: Home Planned disposition location: Hospice care Anticipated discharge date: 3/2 pending setting up home services  Family Communication: wife, daughter, caregiver    Author: Richarda Osmond, DO [facility] 07/03/2021, 7:07 AM   Available by Epic secure chat 7AM-7PM. If 7PM-7AM, please contact night-coverage.  [facility] contact information found on [email or web address].

## 2021-07-03 NOTE — Progress Notes (Addendum)
[place] [facility]) Hospital Liaison Note ?  ?Following lengthy conversation with family, family would like to speak with hospital staff regarding rehab options before pursuing services. IDT aware of above updates. ACC to reengage once decision has been made of dispo.  ? ?Addendum: 3:44p ?Extensive meeting held at bedside with wife, daughter, Jordan Hawks, NP & granddaughter. Families goals remain unclear of how they would like to proceed with care of if they would like to proceed with aggressive vs. Comfort measures. NP & MSW concluded meeting encouraging family to make a decision that they are most comfortable with. ACC to remain available if family decides to pursue comfort measures.  ?  ?Addendum: 6:00p ?Additional extensive meeting held with family at bedside. All family members listed above were present. After further discussion, family has decided that they would like to go home with services. They have requested some time to arrange caregivers at home before discharge and MSW notified family that would be a conversation with hospital staff to permit this.  ? ?Spoke with Helene Kelp (D), Caryl Pina (GD) & Izora Gala (w) to initiate education related to hospice philosophy, services, and team approach to care. All verbalized understanding of information given. Per discussion, the plan is for patient to discharge home via [facility] once cleared to DC.  ?  ?DME needs discussed. Patient has the following equipment in the home ?(Purchased privately): ?[facility] Rile ?Patient requests the following equipment for delivery: ?Hospital bed  ?Chux  ?Bedside table  ?Oxygen (2L) ? ?Address verified and is correct in the chart. Izora Gala is the family member to contact to arrange time of equipment delivery.  ?  ?Please send signed and completed DNR home with patient/family. Please provide prescriptions at discharge as needed to ensure ongoing symptom management.  ?\ ?Please call with any questions/concerns.  ?  ?Thank you for the  opportunity to participate in this patient's care. ?  ?Daphene Calamity, MSW ?[place]  ?[phone] ?

## 2021-07-03 NOTE — Progress Notes (Signed)
Progress note: ? ?HPI: 81 y.o. 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 ?  ?During course of hospitalization, acute encephalopathy has not improved.  Kidney function has continued to decline. Wife opted for Hospice at Home and focus on comfort but is now reconsidering hospice at home vs rehab.  ? ?PE: Patient appears miserable, does not awaken or acknowledge my presence, he is asleep with mild brow furrowing, respirations are even and unlabored ? ?Summary: ? ?I met again with patient's wife, patient's daughter Rosanne Ashing, and patient's granddaughter Caryl Pina as well as hospice liaison Lorayne Bender to discuss diagnosis and prognosis.   ? ?Comfort measures versus aggressive medical interventions reviewed in detail again.   ? ?Wife shares she is very concerned about taking the patient home since she does not have 24-hour care.   ? ?I outlined that PT and OT's last notes were recommending skilled nursing facility. ? ?Discussed need for addressing electrolyte imbalance with likelihood of ICU transfer should they forego comfort measures and want to resume full medical treatment. ? ?Family cannot come to an agreement during our discussions.  I encouraged them to continue these conversations amongst themselves and come to a decision as far as focusing on comfort or to move forward with further medical treatments before the end of the day so as not to prolong treatment and suffering for the patient. ? ?Discussed with patient/family the importance of continued conversation with family and the medical providers regarding overall plan of care and treatment options, ensuring decisions are within the context of the patient?s values and GOCs. ? ?Time: 90 minutes ? ?[place] Maveryck Bahri, DNP, FNP-BC ?Palliative Medicine Team ?Team Phone #  [phone] ? ?Greater than 50% of this time was spent counseling and coordinating care related to the above assessment and plan.  ?

## 2021-07-04 LAB — GLUCOSE, CAPILLARY
Glucose-Capillary: 141 mg/dL — ABNORMAL HIGH (ref 70–99)
Glucose-Capillary: 153 mg/dL — ABNORMAL HIGH (ref 70–99)
Glucose-Capillary: 135 mg/dL — ABNORMAL HIGH (ref 70–99)
Glucose-Capillary: 146 mg/dL — ABNORMAL HIGH (ref 70–99)
Glucose-Capillary: 127 mg/dL — ABNORMAL HIGH (ref 70–99)

## 2021-07-04 LAB — PROCALCITONIN: Procalcitonin: 0.11 ng/mL

## 2021-07-04 MED ORDER — POLYETHYLENE GLYCOL 3350 17 G PO PACK: 17.0000 g | PACK | Freq: Every day | ORAL | 0 refills | Status: AC | PRN

## 2021-07-04 MED ORDER — ACETAMINOPHEN 325 MG PO TABS: 650.0000 mg | ORAL_TABLET | Freq: Four times a day (QID) | ORAL | Status: AC | PRN

## 2021-07-04 MED ORDER — ONDANSETRON HCL 4 MG PO TABS: 4.0000 mg | ORAL_TABLET | Freq: Four times a day (QID) | ORAL | 0 refills | Status: AC | PRN

## 2021-07-04 NOTE — Progress Notes (Addendum)
[facility]) Hospital Liaison Note ?  ?Family/Teresa contacted family to confirm DME delivery and family reports that they will contact MSW upon delivery. MSW awaiting updates. ? ?Addendum: 2:25p ?MSW contacted wife/Nancy to confirm DME delivery with no response & unable to leave VM. ? ?MSW contacted daughter/Teresa and DME confirmed. Transport has been arranged by MSW. TOC aware. ? ?Please send signed and completed DNR home with patient/family. Please provide prescriptions at discharge as needed to ensure ongoing symptom management.  ? ?Please call with any questions/concerns.  ?  ?Thank you for the opportunity to participate in this patient's care. ?  ?Daphene Calamity, MSW ?[place]  ?[phone] ?

## 2021-07-04 NOTE — Evaluation (Signed)
Clinical/Bedside Swallow Evaluation ?Patient Details  ?Name: Robert Conrad ?MRN: [id number] ?Date of Birth: Apr 07, 1941 ? ?Today's Date: 07/04/2021 ?Time: SLP Start Time (ACUTE ONLY): [id number] SLP Stop Time (ACUTE ONLY): 0940 ?SLP Time Calculation (min) (ACUTE ONLY): 15 min ? ?Past Medical History:  ?Past Medical History:  ?Diagnosis Date  ? 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 Arthritis   ? Bowel trouble 1999  ? Diabetes mellitus without complication ([place]) 6295  ? non insulin dependent  ? Diverticulitis   ? Eye problems 2010  ? GERD (gastroesophageal reflux disease) 2009  ? Gout 2009  ? Hearing loss   ? Heart disease 1990  ? Hyperlipidemia 2009  ? Hypertension 2009  ? Hypotension   ? Obesity, unspecified   ? Personal history of colonic polyps   ? Personal history of tobacco use, presenting hazards to health   ? Pneumonia of both lower lobes due to infectious organism   ? Renal insufficiency   ? Severe sepsis ([place]) 11/24/2020  ? Special screening for malignant neoplasms, colon   ? Stroke (cerebrum) ([place])   ? Stroke [facility]) 2009  ? Ulcer 2009  ? stomach  ? ?Past Surgical History:  ?Past Surgical History:  ?Procedure Laterality Date  ? CARDIAC CATHETERIZATION  09-30-13  ? COLONOSCOPY  2009  ? COLONOSCOPY  03-07-11  ? Dr Jamal Collin  ? COLONOSCOPY  03/07/2011  ? COLONOSCOPY WITH PROPOFOL N/A 07/16/2016  ? Procedure: COLONOSCOPY WITH PROPOFOL;  Surgeon: Christene Lye, MD;  Location: [facility] ENDOSCOPY;  Service: Endoscopy;  Laterality: N/A;  ? 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 CORONARY ARTERY BYPASS GRAFT    ? EYE SURGERY  2009  ? HERNIA REPAIR  2010  ? ventral and umbilical   ? STOMACH SURGERY    ? sigmoid colon resection  ? ?HPI:  ?Per 58 H&P "Robert Conrad is a 81 y.o. 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  Most of the history is provided by the caregiver, patient's wife  and daughter at bedside.  Per family patient has overall been doing well since his previous UTI in July 2022.  But about 1 week ago his daughter had URI thereafter he started developing symptoms of URI, cough and congestion.  But over the last 48 hours he started having progressive weakness, confusion and tendency of falling.  Denies any fevers, chills or other complaints.  Per family typically eats a good meal but over the past few days he has had poor appetite.  He is also not been taking his torsemide routinely.     In the ED today he had bilateral rhonchorous breath sounds, global confusion without focal neurodeficit and generalized weakness.  Lab work showed acute kidney injury with creatinine of 2.42, baseline 1.7, hyponatremia, mild hyperkalemia, elevated BNP, elevated D-dimer.  UA was negative.  Chest x-ray was suggestive of CHF.  CT head was negative."  ?  ?Assessment / Plan / Recommendation  ?Clinical Impression ? Pt seen for clinical swallowing evaluation. Pt alert and cooperative. Confusion evident. HOH. Paid CNA at bedside who noted pt without difficulty consuming breakfast. ? ?Pt observed with consistencies from regular meal tray including blueberry muffin and iced tea (via straw). Mildly prolonged, but functional, mastication of solids. Pharyngeal swallow appeared [facility] per clinical assessment. To palpation, pt with seemingly timely swallow initiation and seemingly adequate laryngeal elevation. No  change to vocal quality. No overt or subtle s/sx pharyngeal dysphagia.  ? ?Per chart review, pt with tentative d/c home with hospice today. Given pt's d/c plan, will continue a liberalized diet of regular consistency and thin liquids. Recommend standard aspiration precautions as outlined below as well as assistance with feeding given AMS.  ? ?SLP to sign off as pt has not acute SLP needs at this this time.  ? ?Pt's caregiver and RN made aware of results, recommendations, and SLP POC. Caregiver texted daughter  with update.  ? ?SLP Visit Diagnosis: Dysphagia, unspecified (R13.10) ?   ?Aspiration Risk ? Mild aspiration risk  ?  ?Diet Recommendation Regular;Thin liquid  ? ?Medication Administration: Whole meds with liquid ?Supervision: Staff to assist with self feeding;Full supervision/cueing for compensatory strategies ?Compensations: Minimize environmental distractions;Slow rate;Small sips/bites ?Postural Changes: Seated upright at 90 degrees;Remain upright for at least 30 minutes after po intake  ?  ?Other  Recommendations Oral Care Recommendations: Oral care BID;Oral care before and after PO   ? ?Recommendations for follow up therapy are one component of a multi-disciplinary discharge planning process, led by the attending physician.  Recommendations may be updated based on patient status, additional functional criteria and insurance authorization. ? ?Follow up Recommendations No SLP follow up  ? ? ?  ?Assistance Recommended at Discharge Frequent or constant Supervision/Assistance  ?Functional Status Assessment Patient has not had a recent decline in their functional status  ? ? ?Swallow Study   ?General Date of Onset: 06/25/21 (admission date) ?HPI: Per 89 H&P "Robert Conrad is a 81 y.o. 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  Most of the history is provided by the caregiver, patient's wife and daughter at bedside.  Per family patient has overall been doing well since his previous UTI in July 2022.  But about 1 week ago his daughter had URI thereafter he started developing symptoms of URI, cough and congestion.  But over the last 48 hours he started having progressive weakness, confusion and tendency of falling.  Denies any fevers, chills or other complaints.  Per family typically eats a good meal but over the past few days he has had poor appetite.  He is also not been taking his  torsemide routinely.     In the ED today he had bilateral rhonchorous breath sounds, global confusion without focal neurodeficit and generalized weakness.  Lab work showed acute kidney injury with creatinine of 2.42, baseline 1.7, hyponatremia, mild hyperkalemia, elevated BNP, elevated D-dimer.  UA was negative.  Chest x-ray was suggestive of CHF.  CT head was negative." ?Type of Study: Bedside Swallow Evaluation ?Diet Prior to this Study: Regular;Thin liquids ?Respiratory Status: Nasal cannula ?History of Recent Intubation: No ?Behavior/Cognition: Cooperative;Pleasant mood;Confused;Doesn't follow directions ?Oral Cavity Assessment: Within Functional Limits ?Oral Care Completed by SLP: Recent completion by staff ?Oral Cavity - Dentition: Dentures, top;Dentures, bottom ?Vision: Functional for self-feeding (however, self-feeding limited due to AMS) ?Self-Feeding Abilities: Total assist ?Patient Positioning: Upright in bed ?Baseline Vocal Quality: Normal ?Volitional Cough: Cognitively unable to elicit ?Volitional Swallow: Unable to elicit  ?  ?Oral/Motor/Sensory Function Overall Oral Motor/Sensory Function: Generalized oral weakness (lingual tremor noted)   ?Thin Liquid Thin Liquid: Within functional limits ?Presentation: Straw ?Other Comments: ~3 oz  ?  ?Solid ? ? ?  Oral Phase Impairments: Impaired mastication ?Oral Phase Functional Implications: Impaired mastication (mildly prolonged, but functional, mastication)  ? ?  ?  Cherrie Gauze, M.S., CCC-SLP ?Speech-Language Pathologist ?[facility] ?[phone]) ? ?Quintella Baton ?07/04/2021,11:52 AM ? ? ? ?

## 2021-07-04 NOTE — Discharge Summary (Signed)
Physician Discharge Summary  Patient: Robert Conrad GQQ:[id number] DOB: 1940/10/09   Code Status: DNR Admit date: 06/25/2021 Discharge date: 07/04/2021 Disposition: Hospice care, home PCP: Albina Billet, MD  Recommendations for Outpatient Follow-up:  Follow up with hospice upon home arrival [place] with assistance, per SLP evaluation  Discharge Diagnoses:  Principal Problem:   Acute metabolic encephalopathy Active Problems:   PAD (peripheral artery disease) ([place])   DM2 (diabetes mellitus, type 2) ([place])   Essential hypertension   Hyperlipidemia   CAD (coronary artery disease), autologous vein bypass graft   Hyperkalemia   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 male with a PMH significant for CKD IIIb, AAA, BPH, HTN, CAD, type 2 DM, . They presented from home to the ED on 06/25/2021 with AMS, weakness x a couple days. This is following URI symptoms for several days.  He was now having decreased responsiveness, poor PO intake, and several falls. In the ED, it was found that they had AKI, hyponatremia. Head imaging was negative for stroke. Chest xray was positive for signs of CHF.  Respiratory panel was negative.  They were treated with lasix for fluid overload and bronchodilators in hopes to improve the AKI. Nephrology was consulted to help manage AKI and electrolyte abnormalities. Neither fluids or diuretics showed improvement in his conditions. He was ultimately trialed on tolvaptan.  Hyponatremia continued to gradually worsen despite treatment and he did not show signs of improvement clinically.  Palliative was consulted and discussions of disposition with hospice began.  3/1, after much  discussion with hospice and palliative, the family decided to pursue home hospice.  Patient remained stable which allowed time for arrangements at home to be made in support of taking him home with hospice.   Discharge Condition: Stable Recommended discharge diet: Regular healthy diet  Consultations: Palliative Nephrology Hospice   Procedures/Studies: None   Allergies as of 07/04/2021       Reactions   Ciprofloxacin Hcl Rash        Medication List     STOP taking these medications    amLODipine 5 MG tablet Commonly known as: NORVASC   multivitamin tablet   omeprazole 20 MG capsule Commonly known as: PRILOSEC   risperiDONE 0.25 MG tablet Commonly known as: RISPERDAL   simvastatin 20 MG tablet Commonly known as: ZOCOR   torsemide 20 MG tablet Commonly known as: DEMADEX   Toujeo SoloStar 300 UNIT/ML Solostar Pen Generic drug: insulin glargine (1 Unit Dial)       TAKE these medications    acetaminophen 325 MG tablet Commonly known as: TYLENOL Take 2 tablets (650 mg total) by mouth every 6 (six) hours as needed for mild pain, fever or headache.   aspirin 81 MG tablet Take 81 mg by mouth daily.   divalproex 500 MG DR tablet Commonly known as: DEPAKOTE Take 250-500 mg by mouth 2 (two) times daily. 250 mg every morning and 500 mg at bedtime What changed: Another medication with the same name was removed. Continue taking this medication, and follow the directions you see here.   metoprolol succinate 25 MG 24 hr tablet Commonly known as: TOPROL-XL Take 25 mg by mouth  2 (two) times daily.   ondansetron 4 MG tablet Commonly known as: ZOFRAN Take 1 tablet (4 mg total) by mouth every 6 (six) hours as needed for up to 10 days for nausea.   polyethylene glycol 17 g packet Commonly known as: MIRALAX / GLYCOLAX Take 17 g by mouth daily as needed.   QUEtiapine 50 MG tablet Commonly known as: SEROQUEL Take 50 mg by mouth at bedtime. What changed: Another  medication with the same name was removed. Continue taking this medication, and follow the directions you see here.   tamsulosin 0.4 MG Caps capsule Commonly known as: FLOMAX Take 0.4 mg by mouth daily.         Subjective   Pt reports no complaints today. He is hungry and enjoying breakfast.  Objective  Blood pressure (!) 115/58, pulse 70, temperature 97.6 F (36.4 C), resp. rate 19, height 5\' 8"  (1.727 m), weight 86 kg, SpO2 92 %.   General: Pt is alert, awake, not in acute distress Cardiovascular: RRR, S1/S2 +, no rubs, no gallops Respiratory: CTA bilaterally, no wheezing, no rhonchi Abdominal: Soft, NT, ND, bowel sounds + Extremities: no edema, no cyanosis   The results of significant diagnostics from this hospitalization (including imaging, microbiology, ancillary and laboratory) are listed below for reference.   Imaging studies: DG Chest 1 View  Result Date: 06/25/2021 CLINICAL DATA:  Shortness of breath, increased weakness and cough EXAM: CHEST  1 VIEW COMPARISON:  06/24/2018 FINDINGS: No focal consolidation. Mild bilateral interstitial thickening. No pleural effusion or pneumothorax. Mild cardiomegaly. Prior CABG. No acute osseous abnormality. IMPRESSION: 1. Mild CHF. Electronically Signed   By: Kathreen Devoid M.D.   On: 06/25/2021 10:54   DG Chest 2 View  Result Date: 06/24/2021 CLINICAL DATA:  81 year old male with cough EXAM: CHEST - 2 VIEW COMPARISON:  11/26/2020 FINDINGS: Cardiomediastinal silhouette unchanged in size and contour. Surgical changes of median sternotomy and CABG. No evidence of central vascular congestion. No interlobular septal thickening. Low lung volumes persist, with coarsened interstitial markings and no confluent airspace disease. Persisting bronchial wall thickening. No pneumothorax or pleural effusion. No acute displaced fracture. Degenerative changes of the spine. IMPRESSION: Similar appearance to the prior chest x-ray, with low lung volumes and  similar appearance of bronchial wall thickening, potentially related to bronchitis Surgical changes of median sternotomy and CABG Electronically Signed   By: Corrie Mckusick D.O.   On: 06/24/2021 16:46   CT HEAD WO CONTRAST (5MM)  Result Date: 07/02/2021 CLINICAL DATA:  Altered mental status. EXAM: CT HEAD WITHOUT CONTRAST TECHNIQUE: Contiguous axial images were obtained from the base of the skull through the vertex without intravenous contrast. RADIATION DOSE REDUCTION: This exam was performed according to the departmental dose-optimization program which includes automated exposure control, adjustment of the mA and/or kV according to patient size and/or use of iterative reconstruction technique. COMPARISON:  MRI brain and CT head dated June 25, 2021. FINDINGS: Brain: No evidence of acute infarction, hemorrhage, hydrocephalus, extra-axial collection or mass lesion/mass effect. 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 Stable atrophy and chronic microvascular ischemic changes. Vascular: Calcified atherosclerosis at the skull base. No hyperdense vessel. Skull: Normal. Negative for fracture or focal lesion. Sinuses/Orbits: No acute finding. Other: None. IMPRESSION: 1. No acute intracranial abnormality. 2. Stable atrophy, chronic microvascular ischemic changes, and old right MCA territory infarct. Electronically Signed   By: Titus Dubin M.D.   On: 07/02/2021 13:54   CT Head Wo Contrast  Result Date: 06/25/2021 CLINICAL DATA:  Altered mental status EXAM: CT HEAD WITHOUT CONTRAST TECHNIQUE: Contiguous axial images were obtained from the base of the skull through the vertex without intravenous contrast. RADIATION DOSE REDUCTION: This exam was performed according to the departmental dose-optimization program which includes automated exposure control, adjustment of the mA and/or kV according to patient size and/or use of iterative reconstruction technique.  COMPARISON:  11/27/2020 FINDINGS: Brain: No acute intracranial findings are seen. Cortical sulci are prominent. There is encephalomalacia in right temporal lobe. There is decreased density in the periventricular white matter. Vascular: Scattered arterial calcifications are seen. Skull: Unremarkable. Sinuses/Orbits: Unremarkable. Other: No significant interval changes are noted IMPRESSION: No acute intracranial findings are seen in noncontrast CT brain. Old infarct is seen in the right temporal lobe. Atrophy. Small-vessel disease. Electronically Signed   By: Elmer Picker M.D.   On: 06/25/2021 16:07   MR BRAIN WO CONTRAST  Result Date: 06/25/2021 CLINICAL DATA:  Neuro deficit, acute, stroke suspected EXAM: MRI HEAD WITHOUT CONTRAST TECHNIQUE: Multiplanar, multiecho pulse sequences of the brain and surrounding structures were obtained without intravenous contrast. COMPARISON:  Same day head CT.  MRI November 18, 2020. FINDINGS: Mostly moderately motion limited with a few series severely limited. Brain: No acute infarction, acute hemorrhage, hydrocephalus, extra-axial collection or mass lesion. Redemonstrated chronic right MCA territory cortically based infarcts in the right parietal and temporal lobes and right insula. Additional mild T2/FLAIR hyperintensity scattered within the white matter, nonspecific but of chronic microvascular disease. Chronic cerebral atrophy. Vascular: Redemonstrated signal abnormality within the right internal carotid artery siphon, compatible with known chronic right ICA occlusion. Skull and upper cervical spine: Normal marrow signal. Sinuses/Orbits: Negative. Other: No sizable mastoid effusions. IMPRESSION: 1. Moderate to severely motion limited study without evidence of acute abnormality. 2. Right right MCA territory infarcts. 3. Chronic right ICA occlusion. Electronically Signed   By: Margaretha Sheffield M.D.   On: 06/25/2021 18:06   US RENAL  Result Date: 06/30/2021 CLINICAL  DATA:  Renal dysfunction EXAM: RENAL / URINARY TRACT ULTRASOUND COMPLETE COMPARISON:  10/29/2020 FINDINGS: Right Kidney: Renal measurements: 10.8 x 5.6 x 4.7 cm = volume: 149.6 mL. There is increased cortical echogenicity. There is 1 cm anechoic structure in the lower pole suggesting renal cyst. Left Kidney: Renal measurements: 8.9 x 4.4 x 2.4 cm = volume: 49.8 mL. There is no hydronephrosis. There is slightly increased cortical echogenicity. There is possible 4 mm echogenic focus in the lower pole of left kidney. In the CT done on 11/24/2020, there was no definite demonstrable renal stone. Scattered calcifications were noted in the renal artery branches in both kidneys in the previous CT. Bladder: Unremarkable Other: Prostate measures 5 x 4.8 x 4.9 cm. IMPRESSION: There is no hydronephrosis. There is slightly increased cortical echogenicity in the kidneys. There is possible 1 cm cyst in the lower pole of right kidney. There is 4 mm echogenic focus in the lower pole of left kidney which may suggest vascular calcification or nonobstructing renal stone. Electronically Signed   By: Elmer Picker M.D.   On: 06/30/2021 15:53   DG Chest Port 1 View  Result Date: 07/02/2021 CLINICAL DATA:  Hypoxia. EXAM: PORTABLE CHEST 1 VIEW COMPARISON:  One-view chest x-ray 06/30/2021. FINDINGS: Heart is enlarged. Lung volumes are low. Mild bibasilar airspace opacities are similar the prior exam. IMPRESSION: 1. Stable cardiomegaly without failure. 2. Low lung volumes with mild bibasilar airspace disease, likely atelectasis. Electronically Signed   By: Wynetta Fines.D.  On: 07/02/2021 09:43   DG Chest Port 1 View  Result Date: 06/30/2021 CLINICAL DATA:  Cough, hypoxia EXAM: PORTABLE CHEST 1 VIEW COMPARISON:  Previous studies including the examination of 06/25/2021 FINDINGS: Transverse diameter of heart is increased. There is poor inspiration. There are no signs of alveolar pulmonary edema. There is prominence of  interstitial markings in the right parahilar region and both lower lung fields. There is worsening of aeration in the lower lung fields. There is minimal blunting of right lateral CP angle. There is no pneumothorax. IMPRESSION: There is prominence of interstitial markings in the right parahilar region and both lower lung fields with interval worsening suggesting mild interstitial edema or interstitial pneumonia. Possible minimal right pleural effusion. Electronically Signed   By: Elmer Picker M.D.   On: 06/30/2021 11:36   ECHOCARDIOGRAM COMPLETE  Result Date: 06/26/2021    ECHOCARDIOGRAM REPORT   Patient Name:   FREDRICO BEEDLE [facility] Date of Exam: 06/26/2021 Medical Rec #:  [id number]           Height:       68.0 in Accession #:    [id number]          Weight:       199.7 lb Date of Birth:  1940-10-29            BSA:          2.043 m Patient Age:    10 years            BP:           141/95 mmHg Patient Gender: M                   HR:           69 bpm. Exam Location:  [facility] Procedure: 2D Echo, Color Doppler and Cardiac Doppler Indications:     Cardiomyopathy I42.9  History:         Patient has prior history of Echocardiogram examinations, most                  recent 12/08/2018. Prior CABG, Stroke; Risk Factors:Hypertension,                  Diabetes and Dyslipidemia.  Sonographer:     Mikki Santee RDCS Referring Phys:  [id number] [facility] AMIN Diagnosing Phys: Yolonda Kida MD IMPRESSIONS  1. Left ventricular ejection fraction, by estimation, is 55 to 60%. The left ventricle has normal function. The left ventricle has no regional wall motion abnormalities. Left ventricular diastolic parameters are consistent with Grade I diastolic dysfunction (impaired relaxation).  2. Right ventricular systolic function is normal. The right ventricular size is normal.  3. The mitral valve is normal in structure. No evidence of mitral valve regurgitation.  4. The aortic valve is normal in structure. Aortic valve  regurgitation is not visualized. FINDINGS  Left Ventricle: Left ventricular ejection fraction, by estimation, is 55 to 60%. The left ventricle has normal function. The left ventricle has no regional wall motion abnormalities. The left ventricular internal cavity size was normal in size. There is  no left ventricular hypertrophy. Left ventricular diastolic parameters are consistent with Grade I diastolic dysfunction (impaired relaxation). Right Ventricle: The right ventricular size is normal. No increase in right ventricular wall thickness. Right ventricular systolic function is normal. Left Atrium: Left atrial size was normal in size. Right Atrium: Right atrial size was normal in size. Pericardium: There is no evidence of pericardial effusion.  Mitral Valve: The mitral valve is normal in structure. No evidence of mitral valve regurgitation. Tricuspid Valve: The tricuspid valve is normal in structure. Tricuspid valve regurgitation is mild. Aortic Valve: The aortic valve is normal in structure. Aortic valve regurgitation is not visualized. Pulmonic Valve: The pulmonic valve was normal in structure. Pulmonic valve regurgitation is not visualized. Aorta: The ascending aorta was not well visualized. IAS/Shunts: No atrial level shunt detected by color flow Doppler.  LEFT VENTRICLE PLAX 2D LVIDd:         3.80 cm     Diastology LVIDs:         2.90 cm     LV e' medial:    8.05 cm/s LV PW:         1.40 cm     LV E/e' medial:  12.2 LV IVS:        1.60 cm     LV e' lateral:   9.68 cm/s LVOT diam:     2.30 cm     LV E/e' lateral: 10.1 LV SV:         101 LV SV Index:   49 LVOT Area:     4.15 cm  LV Volumes (MOD) LV vol d, MOD A2C: 58.1 ml LV vol d, MOD A4C: 71.7 ml LV vol s, MOD A2C: 18.8 ml LV vol s, MOD A4C: 22.4 ml LV SV MOD A2C:     39.3 ml LV SV MOD A4C:     71.7 ml LV SV MOD BP:      45.6 ml RIGHT VENTRICLE RV S prime:     6.31 cm/s LEFT ATRIUM             Index        RIGHT ATRIUM           Index LA diam:        3.30 cm  1.62 cm/m   RA Area:     17.00 cm LA Vol (A2C):   41.2 ml 20.17 ml/m  RA Volume:   40.50 ml  19.83 ml/m LA Vol (A4C):   52.5 ml 25.70 ml/m LA Biplane Vol: 48.2 ml 23.60 ml/m  AORTIC VALVE LVOT Vmax:   106.00 cm/s LVOT Vmean:  74.600 cm/s LVOT VTI:    0.242 m  AORTA Ao Root diam: 3.80 cm MITRAL VALVE MV Area (PHT): 3.46 cm     SHUNTS MV Decel Time: 219 msec     Systemic VTI:  0.24 m MV E velocity: 97.90 cm/s   Systemic Diam: 2.30 cm MV A velocity: 101.00 cm/s MV E/A ratio:  0.97 Dwayne D Callwood MD Electronically signed by Yolonda Kida MD Signature Date/Time: 06/26/2021/4:44:01 PM    Final     Labs: Basic Metabolic Panel: Recent Labs  Lab 06/28/21 0500 06/29/21 0420 06/30/21 0415 07/01/21 0343 07/02/21 0434 07/02/21 1845 07/03/21 0134  NA 129* 128* 126* 127* 125* 125* 124*  K 4.6 4.8 5.2* 5.7* 5.8*  --   --   CL 91* 92* 91* 89* 87*  --   --   CO2 27 26 25 23 27   --   --   GLUCOSE 108* 114* 116* 104* 150*  --   --   BUN 46* 56* 61* 63* 70*  --   --   CREATININE 2.56* 2.48* 2.54* 2.81* 2.98*  --   --   CALCIUM 8.8* 9.1 8.9 9.0 9.4  --   --   PHOS  --   --   --  6.2* 5.1*  --   --    CBC: Recent Labs  Lab 06/28/21 0500 06/29/21 0419 06/30/21 0415 07/01/21 0343 07/02/21 0434  WBC 9.1 10.5 11.4* 13.4* 15.7*  HGB 12.5* 13.4 13.3 13.4 14.2  HCT 37.1* 40.6 40.2 40.8 43.3  MCV 86.9 87.5 86.8 88.3 86.6  PLT 168 198 192 194 190   Microbiology: none  Time coordinating discharge: Over 30 minutes  Richarda Osmond, MD  [facility] 07/04/2021, 12:18 PM

## 2021-07-04 NOTE — Care Management Important Message (Signed)
Important Message ? ?Patient Details  ?Name: Robert Conrad ?MRN: [id number] ?Date of Birth: November 06, 1940 ? ? ?Medicare Important Message Given:  Other (see comment) ? ?Patient going home with hospice. Out of respect for the patient and family no Important Message from [facility] given.  ? ? ?[name] ?07/04/2021, 10:16 AM ?

## 2021-07-04 NOTE — Progress Notes (Addendum)
Nutrition Brief Note ? ?Patient identified on the [place] Report ? ?Wt Readings from Last 15 Encounters:  ?07/03/21 86 kg  ?04/01/21 79.4 kg  ?11/25/20 78.8 kg  ?10/26/20 89.8 kg  ?09/28/20 95.3 kg  ?09/13/20 90.7 kg  ?12/01/19 90.3 kg  ?12/10/18 86.5 kg  ?11/29/18 86.5 kg  ?11/26/17 93 kg  ?12/18/16 94.3 kg  ?07/16/16 95.3 kg  ?06/11/16 95.7 kg  ?10/05/13 96.2 kg  ?02/24/13 98 kg  ? ?Pt admitted with acute metabolic encephalopathy.  ? ?Reviewed I/O's: -2.4 L x 24 hours and -15.4 L since admission ? ?UOP: 2.6 L x 24 hours ? ?Chart reviewed. Per hospice and palliative care notes, plan to focus on comfort and discharge home with hospice services.  ? ?Given goals of care, RD will liberalize diet for widest variety of meal selections. Family requesting dysphagia 1 diet; ordered per their request. ? ?Medications reviewed and include miralax and veltassa.  ? ?Lab Results  ?Component Value Date  ? HGBA1C 6.6 (H) 06/25/2021  ? PTA DM medications are 10 units tuojeo daily.  ? ?Labs reviewed: CBGS: 135-159 (inpatient orders for glycemic control are 0-9 units insulin aspart TID with meals and 5 units insulin glargine-yfgn daily).   ? ?Current diet order is heart healthy/ carb modified, patient is consuming approximately 25-100% of meals at this time. Labs and medications reviewed.  ? ?No nutrition interventions warranted at this time. If nutrition issues arise, please consult RD.  ? ?Loistine Chance, RD, LDN, CDCES ?Registered Dietitian II ?Certified Diabetes Care and Education Specialist ?Please refer to [facility] for RD and/or RD on-call/weekend/after hours pager   ?

## 2021-07-04 NOTE — TOC Progression Note (Signed)
Transition of Care (TOC) - Progression Note  ? ? ?Patient Details  ?Name: Robert Conrad ?MRN: [id number] ?Date of Birth: [birth year] ? ?Transition of Care (TOC) CM/SW Contact  ?Pete Pelt, RN ?Phone Number: ?07/04/2021, 9:04 AM ? ?Clinical Narrative:   As per [facility], family has decided to go home with hospice services.  Michae Kava of [facility] will order DME to be delivered to the home and arrange for the start of hospice at home services. ? ? ? ?Expected Discharge Plan:  (TBD) ?Barriers to Discharge: Continued Medical Work up ? ?Expected Discharge Plan and Services ?Expected Discharge Plan:  (TBD) ?  ?Discharge Planning Services: CM Consult ?  ?Living arrangements for the past 2 months: [place] ?                ?  ?  ?  ?  ?  ?  ?  ?  ?  ?  ? ? ?Social Determinants of Health (SDOH) Interventions ?  ? ?Readmission Risk Interventions ?No flowsheet data found. ? ?

## 2021-07-04 NOTE — Progress Notes (Incomplete)
PROGRESS NOTE  Robert Conrad    DOB: 11/23/1940, 81 y.o.  UVO:[id number]    Code Status: DNR   DOA: 06/25/2021   LOS: 9   Brief hospital course  Robert Conrad is a 81 y.o. male with a PMH significant for CKD IIIb, AAA, BPH, HTN, CAD, type 2 DM, . They presented from home to the ED on 06/25/2021 with AMS, weakness x a couple days. This is following URI symptoms for several days. He is now having decreased responsiveness, poor PO intake, and several falls. In the ED, it was found that they had non-focal neurological deficits with AKI, hyponatremia. Head imaging was negative for stroke. Chest xray was positive for signs of CHF. Respiratory panel was negative.  They were treated with lasix for fluid overload and bronchodilators. Patient was admitted to medicine service for further workup and management of AMS as outlined in detail below.  Hyponatremia continued to gradually worsen despite treatment and he did not show signs of improvement clinically. Palliative was consulted and discussions of disposition with hospice began.   07/04/21 -family have decided to move forward with home hospice care.   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 No focal neurodeficit.  Initial CT head is negative.  MRI brain was negative for any acute infarcts, positive for chronic right MCA territory infarcts and  cerebral atrophy.  UA-negative.  B12 at lower normal limit of 202, ammonia and TSH within normal limit, valproic acid levels low at 28.  Mild persistent hyponatremia which can be contributory with underlying advanced dementia. -Continue to monitor    Acute bronchitis- (present on admission) -Completed the course of Augmentin and Zithromax -Continue to monitor -Supportive care   AKI on CKD IV   hyponatremia   Baseline creatinine 1.7.  Admission creatinine 2.4>>>2.98, with some worsening of BUN at 70.  Good urinary output. Na+ continued to worsen despite tolvaptan. Na+ 124 today. No acute clinical change -nephrology has signed off due to transition to hospice -Monitor renal function -Avoid nephrotoxins   Acute on chronic diastolic CHF (congestive heart failure) (HCC) BNP was elevated on admission.  Patient with history of CAD.  Echocardiogram with normal EF but grade 1 diastolic dysfunction.  Initially appears volume up and responding well to IV diuresis.  Patient has positive history for exertional dyspnea and orthopnea.  Was sleeping on recliner for some time, unable to lay flat due to breathing difficulties.  All consistent with diastolic CHF.   -Low-dose torsemide was switched with 20 mg IV Lasix  -Daily BMP and weight -Strict intake and output   Acute urinary retention- resolved   Hyperkalemia- (present on admission) Worsening potassium, at 5.8, patient was given Lokelma and Veltassa. Worsening renal function are contributory. -Continue with Veltassa -Monitor potassium   CAD ,autologous vein bypass graft- (present on admission) Currently without any chest pain  -continue aspirin, beta-blocker and statin   DM2 (diabetes  mellitus, type 2) (HCC) -Sliding scale and Accu-Cheks.  CBG currently within goal - Semglee 5 units daily.  - Holding off on home regimen.   Essential hypertension- (present on admission) Blood pressure within goal -Continue home Norvasc, metoprolol.    PAD  (peripheral artery disease) ([place])- (present on admission) -Continue aspirin and statin.   Hyperlipidemia- (present on admission) -Continue statin  Body mass index is 28.83 kg/m.  VTE ppx: heparin injection 5,000 Units Start: 06/25/21 1745  Diet:     Diet   Diet 2 gram sodium Room service appropriate? Yes; Fluid consistency: Thin   Subjective 07/04/21    Pt reports nothing today. He is not responsive to conversation. He is awake and in no apparent distress. Spent a considerable amount of time speaking with his family members at bedside and on phone to answer questions.    Objective   Vitals:   07/04/21 0027 07/04/21 0057 07/04/21 0418 07/04/21 0738  BP: (!) 112/57 120/62 (!) 115/58 127/64  Pulse: 69  70 74  Resp: 13  19 14   Temp: 98.5 F (36.9 C)  97.6 F (36.4 C) 98.3 F (36.8 C)  TempSrc: Oral   Oral  SpO2: 98%  92% 93%  Weight:      Height:        Intake/Output Summary (Last 24 hours) at 07/04/2021 1030 Last data filed at 07/04/2021 0420 Gross per 24 hour  Intake --  Output 1600 ml  Net -1600 ml    Filed Weights   07/01/21 0500 07/02/21 0500 07/03/21 0308  Weight: 89.5 kg 86.6 kg 86 kg     Physical Exam:  General: awake, alert, NAD HEENT: atraumatic, clear conjunctiva, anicteric sclera, MMM Respiratory: normal respiratory effort. CTAB Cardiovascular: quick capillary refill  Nervous: Alert. Does not follow commands or respond to conversation. Falls asleep if not actively engaged Extremities:  generalized edema Skin: dry, intact, normal temperature, normal color. Ecchymoses on forearms  Labs   I have personally reviewed the following labs and imaging studies CBC    Component Value Date/Time   WBC 15.7 (H) 07/02/2021 0434   RBC 5.00 07/02/2021 0434   HGB 14.2 07/02/2021 0434   HGB 15.4 08/31/2013 0743   HCT 43.3 07/02/2021 0434   HCT 39.4 06/25/2021 2118   PLT 190 07/02/2021 0434   PLT 182 08/31/2013 0743   MCV 86.6 07/02/2021 0434   MCV 87  08/31/2013 0743   MCH 28.4 07/02/2021 0434   MCHC 32.8 07/02/2021 0434   RDW 13.9 07/02/2021 0434   RDW 14.5 08/31/2013 0743   LYMPHSABS 1.1 06/25/2021 1515   MONOABS 1.1 (H) 06/25/2021 1515   EOSABS 0.1 06/25/2021 1515   BASOSABS 0.0 06/25/2021 1515   BMP Latest Ref Rng & Units 07/03/2021 07/02/2021 07/02/2021  Glucose 70 - 99 mg/dL - - 150(H)  BUN 8 - 23 mg/dL - - 70(H)  Creatinine 0.61 - 1.24 mg/dL - - 2.98(H)  Sodium 135 - 145 mmol/L 124(L) 125(L) 125(L)  Potassium 3.5 - 5.1 mmol/L - - 5.8(H)  Chloride 98 - 111 mmol/L - - 87(L)  CO2 22 - 32 mmol/L - - 27  Calcium 8.9 - 10.3 mg/dL - - 9.4    CT HEAD WO CONTRAST (5MM)  Result Date: 07/02/2021 CLINICAL DATA:  Altered mental status. EXAM: CT HEAD WITHOUT CONTRAST TECHNIQUE: Contiguous axial images were obtained from the base of the skull through the vertex without intravenous contrast. RADIATION DOSE REDUCTION: This exam was performed according to the departmental dose-optimization  program which includes automated exposure control, adjustment of the mA and/or kV according to patient size and/or use of iterative reconstruction technique. COMPARISON:  MRI brain and CT head dated June 25, 2021. FINDINGS: Brain: No evidence of acute infarction, hemorrhage, hydrocephalus, extra-axial collection or mass lesion/mass effect. 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 Stable atrophy and chronic microvascular ischemic changes. Vascular: Calcified atherosclerosis at the skull base. No hyperdense vessel. Skull: Normal. Negative for fracture or focal lesion. Sinuses/Orbits: No acute finding. Other: None. IMPRESSION: 1. No acute intracranial abnormality. 2. Stable atrophy, chronic microvascular ischemic changes, and old right MCA territory infarct. Electronically Signed   By: Titus Dubin M.D.   On: 07/02/2021 13:54    Disposition Plan & Communication  Patient status: Inpatient  Admitted From:  Home Planned disposition location: Hospice care Anticipated discharge date: 3/2 pending setting up home services  Family Communication: wife, daughter, caregiver    Author: Richarda Osmond, DO [facility] 07/04/2021, 10:31 AM   Available by Epic secure chat 7AM-7PM. If 7PM-7AM, please contact night-coverage.  [facility] contact information found on [email or web address].

## 2021-07-04 NOTE — Progress Notes (Signed)
Scarbro  daughter and [place] of patient at [phone] reviewed discharge information follow up appointments, prescriptions and home meds with her and she verbalized understanding.   ?

## 2021-09-02 DEATH — deceased

## 2021-11-11 ENCOUNTER — Other Ambulatory Visit (INDEPENDENT_AMBULATORY_CARE_PROVIDER_SITE_OTHER): Payer: Medicare HMO

## 2021-11-11 ENCOUNTER — Ambulatory Visit (INDEPENDENT_AMBULATORY_CARE_PROVIDER_SITE_OTHER): Payer: Medicare HMO | Admitting: Vascular Surgery

## 2022-01-14 IMAGING — MR MR HEAD W/O CM
10 of 11 series · 40 of 48 positions shown · non-contrast
Comparison: Prior head CT examinations 10/26/2020 and earlier.
Brain MRI 03/05/2010. Carotid artery duplex 12/09/2018.

CLINICAL DATA: Memory loss. Additional history provided: Patient's
family reports several episodes of syncope 3 months ago, worsening
confusion/disorientation for 2 months; hallucinations at night for 1
month, possible head injury from falls.

EXAM:
MRI HEAD WITHOUT CONTRAST
TECHNIQUE: Multiplanar, multiecho pulse sequences of the brain and surrounding
structures were obtained without intravenous contrast.

[Series 5: ax dwi_tracew · axial · 3.0mm · 0.71mm/px · z∈[-109,+49]mm · 7 of 56 slices shown]
[im 1/56]
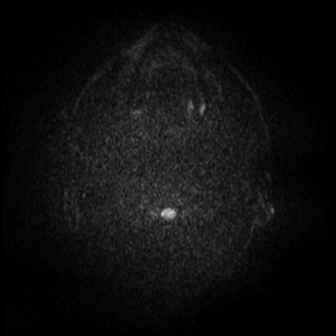
[im 10/56]
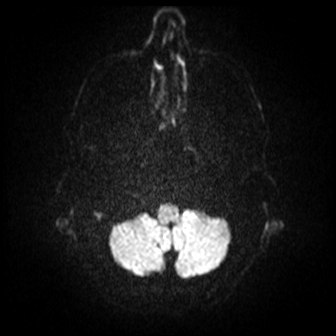
[im 19/56]
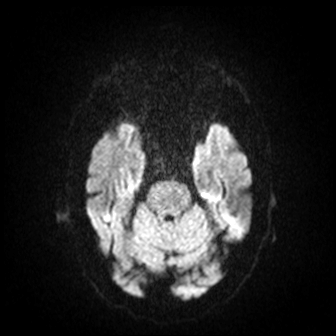
[im 28/56]
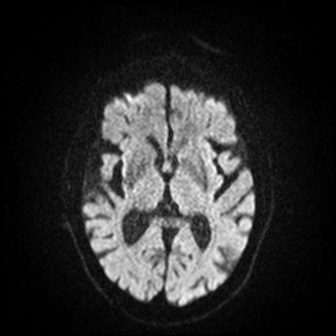
[im 37/56]
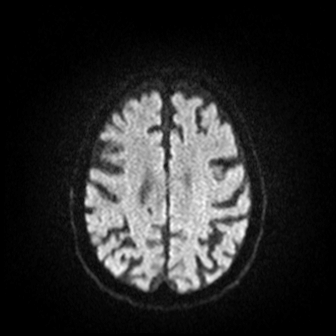
[im 46/56]
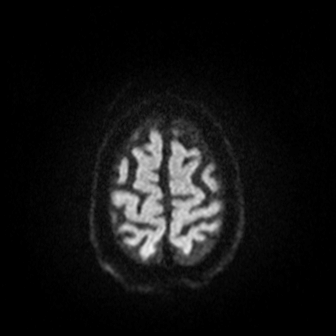
[im 56/56]
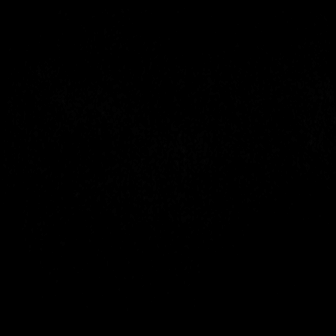

[Series 6: ax dwi_adc · axial · 3.0mm · 0.71mm/px · z∈[-109,+44]mm · 6 of 54 slices shown]
[im 1/54]
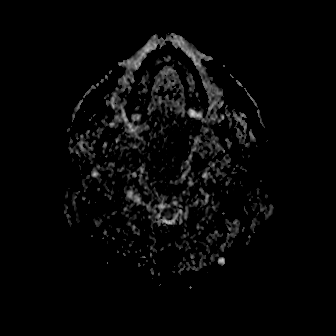
[im 11/54]
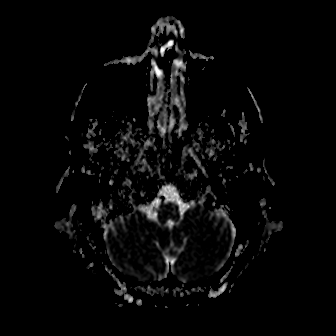
[im 22/54]
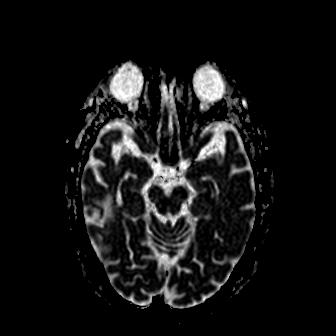
[im 32/54]
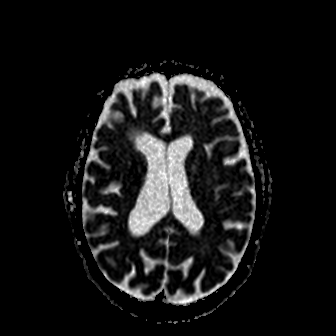
[im 43/54]
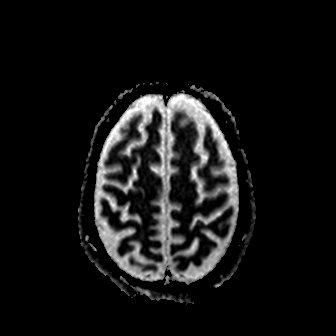
[im 54/54]
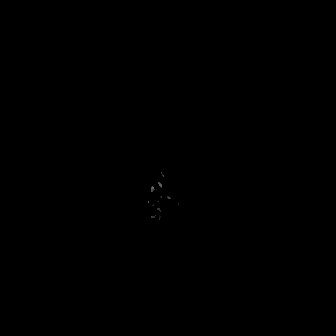

[Series 7: cor dwi_tracew · coronal · 5.0mm · 0.68mm/px · 4 of 38 slices shown]
[im 1/38]
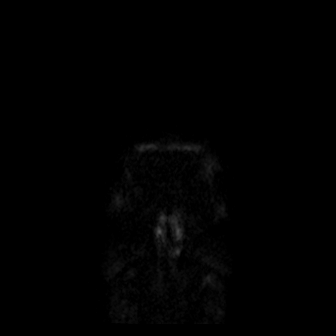
[im 13/38]
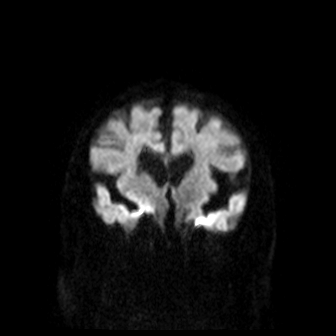
[im 25/38]
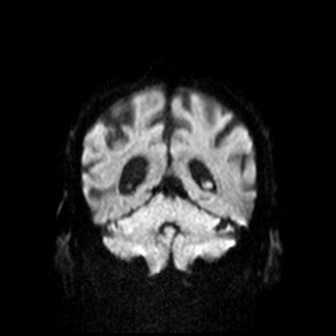
[im 38/38]
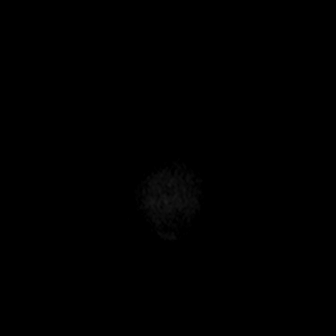

[Series 8: cor dwi_adc · coronal · 5.0mm · 0.68mm/px · 4 of 38 slices shown]
[im 1/38]
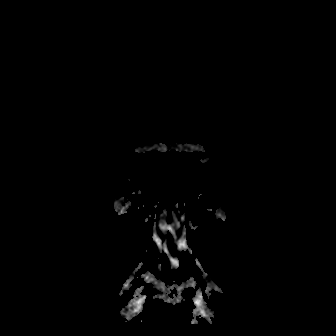
[im 13/38]
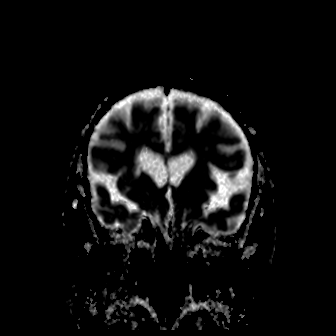
[im 25/38]
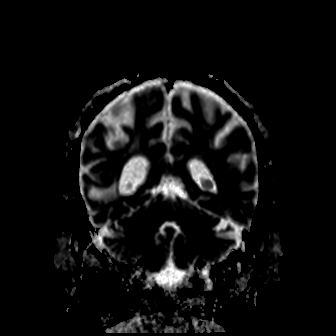
[im 38/38]
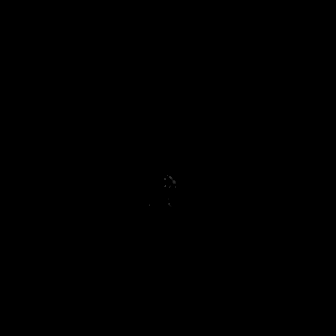

[Series 9: T1 · sagittal · 5.0mm · 0.47mm/px · 2 of 22 slices shown (1 of 2)]
[im 1/22]
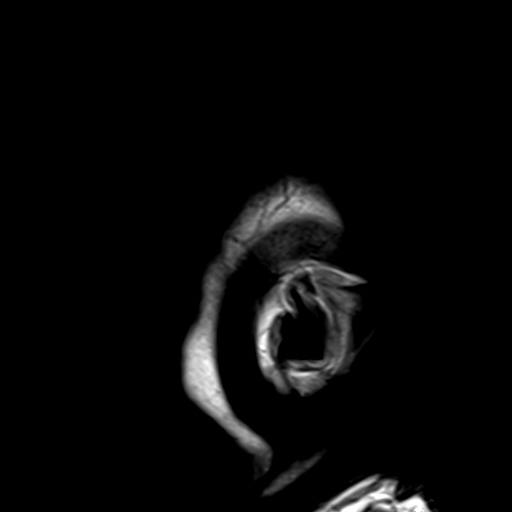
[im 22/22]
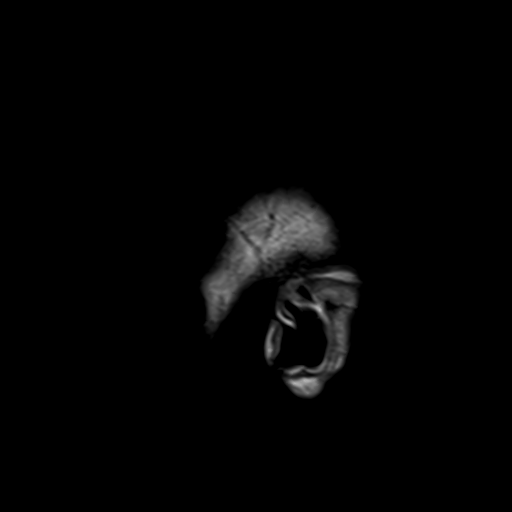

[Series 10: T2 · axial · 5.0mm · 0.86mm/px · z∈[-99,+51]mm · 3 of 27 slices shown (1 of 2)]
[im 1/27]
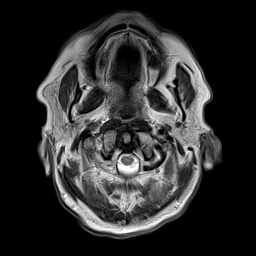
[im 14/27]
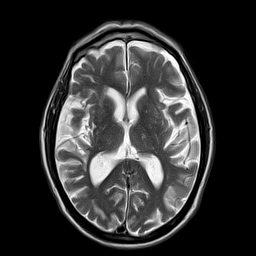
[im 27/27]
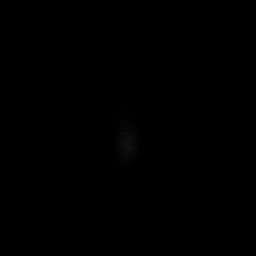

[Series 12: pha_images · axial · 3.0mm · 0.90mm/px · z∈[-98,-11]mm · 4 of 52 slices shown]
[im 1/52]
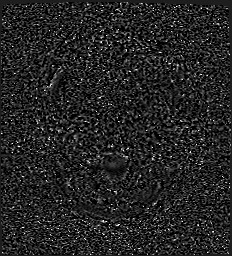
[im 11/52]
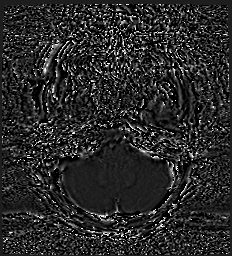
[im 21/52]
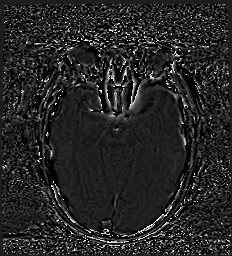
[im 31/52]
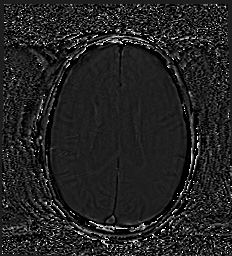

[Series 15: FLAIR · axial · 5.0mm · 0.90mm/px · z∈[-101,+49]mm · 3 of 27 slices shown]
[im 1/27]
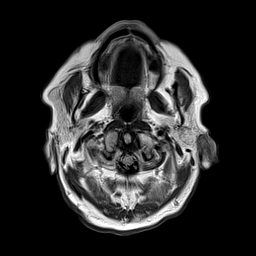
[im 14/27]
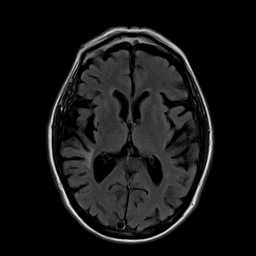
[im 27/27]
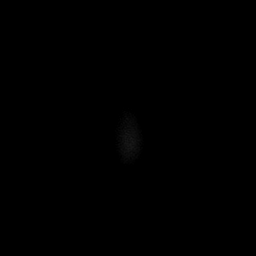

[Series 16: T1 · axial · 5.0mm · 0.90mm/px · z∈[-98,+51]mm · 3 of 27 slices shown (2 of 2)]
[im 1/27]
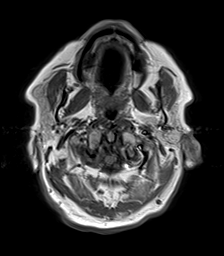
[im 14/27]
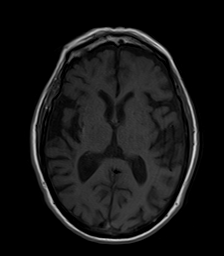
[im 27/27]
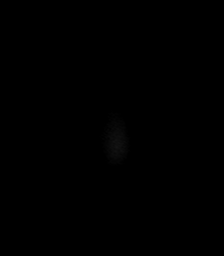

[Series 17: T2 · coronal · 5.0mm · 0.45mm/px · 4 of 33 slices shown (2 of 2)]
[im 1/33]
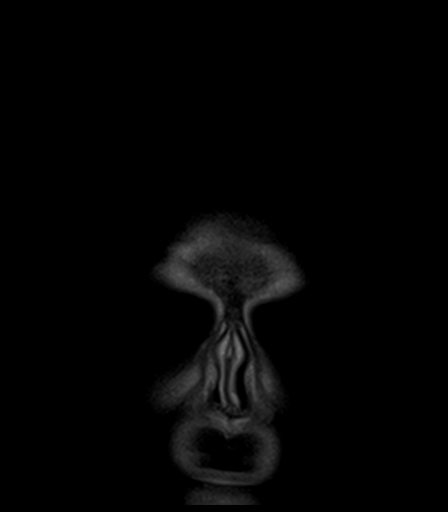
[im 11/33]
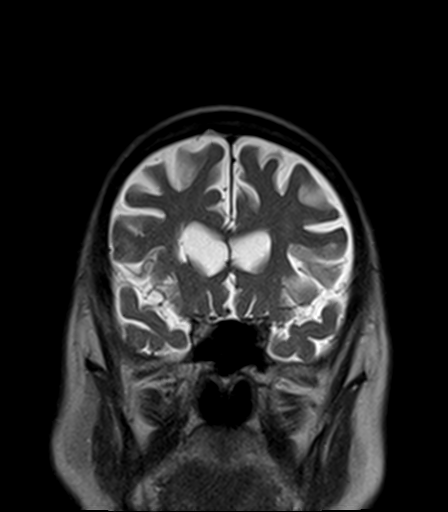
[im 22/33]
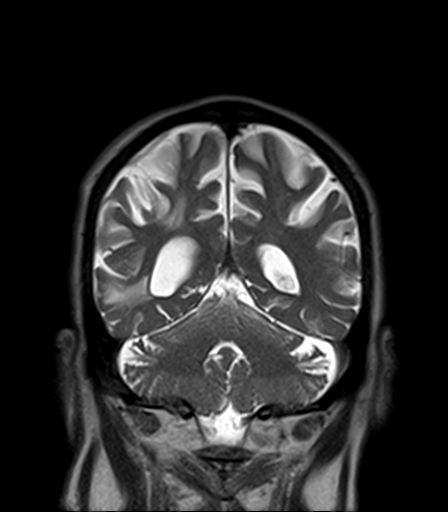
[im 33/33]
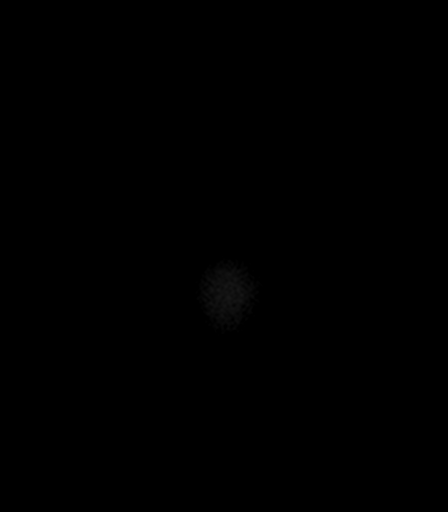

[40 of 48 positions shown; findings below may reference images not displayed]

FINDINGS: Brain:

Intermittently motion degraded examination. Most notably, there is
moderate/severe motion degradation of the sagittal T1 weighted
sequence.

Moderate generalized cerebral atrophy. Comparatively mild cerebellar
atrophy.

Redemonstrated chronic right MCA territory cortically based infarcts
within the right parietal and temporal lobes and right insula.

Redemonstrated chronic lacunar infarcts within the right corona
radiata (series 15, image 17) and deep periventricular white matter
along the frontal horn of the right lateral ventricle (series 15,
image 15). Small amount of chronic hemosiderin deposition associated
with the chronic lacunar infarct along the right lateral ventricle
frontal horn.

Background mild multifocal T2/FLAIR hyperintensity within the
cerebral white matter, nonspecific but compatible with chronic small
vessel ischemic disease.

Small chronic lacunar infarct within the left thalamus.

Tiny chronic infarcts within the bilateral cerebellar hemispheres.

There is no acute infarct.

No evidence of an intracranial mass.

No extra-axial fluid collection.

No midline shift.

Vascular: Redemonstrated signal abnormality within the right
internal carotid artery siphon, compatible with known chronic right
ICA occlusion.

Skull and upper cervical spine: Within limitations of motion
degradation, no focal marrow lesion is identified. Mild C2-C3 and
C3-C4 grade 1 anterolisthesis.

Sinuses/Orbits: Visualized orbits show no acute finding. Bilateral
lens replacements. No significant paranasal sinus disease.

Other: Right mastoid effusion. Trace fluid also present within the
left mastoid air cells.
IMPRESSION: 1. Intermittently motion degraded examination, as described.
2. No evidence of acute intracranial abnormality.
3. Redemonstrated chronic right MCA territory cortically-based
infarcts within the parietal and temporal lobes, as well as insula.
4. Chronic lacunar infarcts within the right corona radiata, right
frontal lobe periventricular white matter and left thalamus.
5. Background mild chronic small vessel ischemic changes within the
cerebral white matter.
6. Tiny chronic infarcts within the bilateral cerebellar
hemispheres.
7. Moderate generalized cerebral atrophy with comparatively mild
cerebellar atrophy.
8. Redemonstrated signal abnormality within the right internal
carotid artery siphon, compatible with known chronic right ICA
occlusion.
9. Right mastoid effusion. Trace fluid also present within the left
mastoid air cells.

## 2022-01-19 IMAGING — DX DG CHEST 1V PORT
1 series · 1 of 1 positions shown · non-contrast
Comparison: Chest radiograph dated 10/26/2020.

CLINICAL DATA: 79-year-old male with hematuria.

EXAM:
PORTABLE CHEST 1 VIEW

[chest ap]
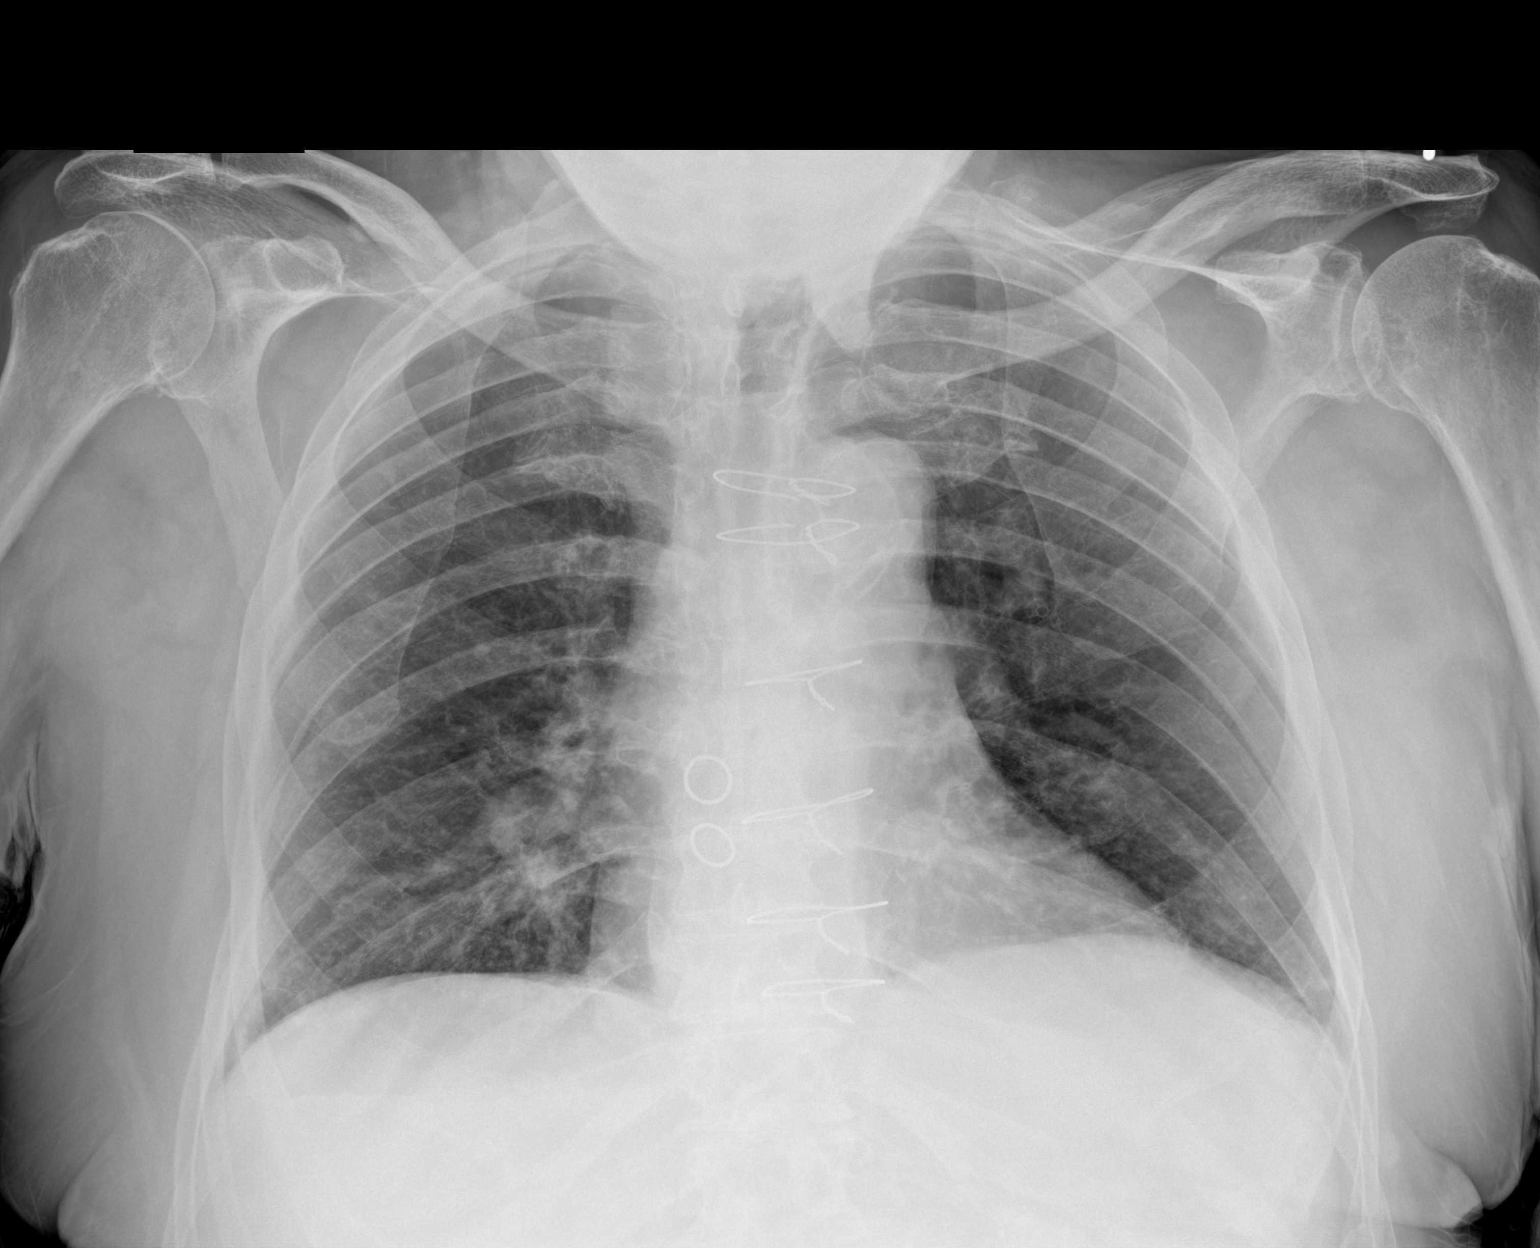

[1 of 1 positions shown; findings below may reference images not displayed]

FINDINGS: No focal consolidation pleural effusion, pneumothorax. The cardiac
silhouette is within limits. Atherosclerotic calcification the
aorta. Median sternotomy wires. No acute osseous pathology.
IMPRESSION: No active cardiopulmonary disease.
